# Patient Record
Sex: Male | Born: 1955
Health system: Southern US, Community
[De-identification: ages and names within clinical notes are randomized; demographics above are authoritative.]

## PROBLEM LIST (undated history)

## (undated) DIAGNOSIS — K219 Gastro-esophageal reflux disease without esophagitis: Secondary | ICD-10-CM

## (undated) DIAGNOSIS — R7309 Other abnormal glucose: Secondary | ICD-10-CM

## (undated) DIAGNOSIS — K635 Polyp of colon: Secondary | ICD-10-CM

## (undated) DIAGNOSIS — M75100 Unspecified rotator cuff tear or rupture of unspecified shoulder, not specified as traumatic: Secondary | ICD-10-CM

## (undated) DIAGNOSIS — E785 Hyperlipidemia, unspecified: Secondary | ICD-10-CM

## (undated) DIAGNOSIS — Z8249 Family history of ischemic heart disease and other diseases of the circulatory system: Secondary | ICD-10-CM

## (undated) HISTORY — DX: Unspecified rotator cuff tear or rupture of unspecified shoulder, not specified as traumatic: M75.100

## (undated) HISTORY — DX: Other abnormal glucose: R73.09

## (undated) HISTORY — PX: COLONOSCOPY: SHX174

## (undated) HISTORY — DX: Hyperlipidemia, unspecified: E78.5

## (undated) HISTORY — PX: OTHER SURGICAL HISTORY: SHX169

## (undated) HISTORY — DX: Gastro-esophageal reflux disease without esophagitis: K21.9

## (undated) HISTORY — DX: Polyp of colon: K63.5

## (undated) HISTORY — DX: Family history of ischemic heart disease and other diseases of the circulatory system: Z82.49

---

## 1991-02-12 HISTORY — PX: CORNEAL TRANSPLANT: SHX108

## 2000-01-02 ENCOUNTER — Encounter: Payer: Self-pay | Admitting: Neurosurgery

## 2000-01-02 ENCOUNTER — Ambulatory Visit (HOSPITAL_COMMUNITY): Admission: RE | Admit: 2000-01-02 | Discharge: 2000-01-02 | Payer: Self-pay | Admitting: Neurosurgery

## 2000-01-16 ENCOUNTER — Encounter: Payer: Self-pay | Admitting: Neurosurgery

## 2000-01-16 ENCOUNTER — Ambulatory Visit (HOSPITAL_COMMUNITY): Admission: RE | Admit: 2000-01-16 | Discharge: 2000-01-16 | Payer: Self-pay | Admitting: Neurosurgery

## 2000-01-30 ENCOUNTER — Encounter: Payer: Self-pay | Admitting: Neurosurgery

## 2000-01-30 ENCOUNTER — Ambulatory Visit (HOSPITAL_COMMUNITY): Admission: RE | Admit: 2000-01-30 | Discharge: 2000-01-30 | Payer: Self-pay | Admitting: Neurosurgery

## 2000-03-12 ENCOUNTER — Ambulatory Visit (HOSPITAL_COMMUNITY): Admission: RE | Admit: 2000-03-12 | Discharge: 2000-03-12 | Payer: Self-pay | Admitting: Neurosurgery

## 2000-03-12 ENCOUNTER — Encounter: Payer: Self-pay | Admitting: Neurosurgery

## 2000-04-18 ENCOUNTER — Encounter: Payer: Self-pay | Admitting: Neurosurgery

## 2000-04-22 ENCOUNTER — Inpatient Hospital Stay (HOSPITAL_COMMUNITY): Admission: RE | Admit: 2000-04-22 | Discharge: 2000-04-24 | Payer: Self-pay | Admitting: Neurosurgery

## 2000-04-22 ENCOUNTER — Encounter: Payer: Self-pay | Admitting: Neurosurgery

## 2002-10-22 ENCOUNTER — Encounter: Payer: Self-pay | Admitting: Cardiology

## 2002-10-22 ENCOUNTER — Ambulatory Visit (HOSPITAL_COMMUNITY): Admission: RE | Admit: 2002-10-22 | Discharge: 2002-10-22 | Payer: Self-pay | Admitting: Cardiology

## 2003-02-12 HISTORY — PX: OTHER SURGICAL HISTORY: SHX169

## 2003-04-27 ENCOUNTER — Encounter: Admission: RE | Admit: 2003-04-27 | Discharge: 2003-04-27 | Payer: Self-pay | Admitting: Family Medicine

## 2003-07-23 ENCOUNTER — Encounter: Admission: RE | Admit: 2003-07-23 | Discharge: 2003-07-23 | Payer: Self-pay | Admitting: Family Medicine

## 2004-01-02 ENCOUNTER — Encounter: Admission: RE | Admit: 2004-01-02 | Discharge: 2004-01-02 | Payer: Self-pay | Admitting: Cardiology

## 2004-02-12 HISTORY — PX: BACK SURGERY: SHX140

## 2004-02-17 ENCOUNTER — Encounter: Admission: RE | Admit: 2004-02-17 | Discharge: 2004-02-17 | Payer: Self-pay | Admitting: Family Medicine

## 2004-04-20 ENCOUNTER — Encounter: Admission: RE | Admit: 2004-04-20 | Discharge: 2004-04-20 | Payer: Self-pay | Admitting: Cardiology

## 2004-05-01 ENCOUNTER — Inpatient Hospital Stay (HOSPITAL_BASED_OUTPATIENT_CLINIC_OR_DEPARTMENT_OTHER): Admission: RE | Admit: 2004-05-01 | Discharge: 2004-05-01 | Payer: Self-pay | Admitting: Cardiology

## 2004-11-06 ENCOUNTER — Encounter: Admission: RE | Admit: 2004-11-06 | Discharge: 2004-11-06 | Payer: Self-pay | Admitting: Cardiology

## 2005-02-11 HISTORY — PX: OTHER SURGICAL HISTORY: SHX169

## 2005-08-17 ENCOUNTER — Encounter: Admission: RE | Admit: 2005-08-17 | Discharge: 2005-08-17 | Payer: Self-pay | Admitting: Family Medicine

## 2005-09-26 ENCOUNTER — Ambulatory Visit: Payer: Self-pay | Admitting: Internal Medicine

## 2005-09-27 ENCOUNTER — Ambulatory Visit: Payer: Self-pay | Admitting: Internal Medicine

## 2005-10-01 ENCOUNTER — Encounter: Admission: RE | Admit: 2005-10-01 | Discharge: 2005-10-01 | Payer: Self-pay | Admitting: Internal Medicine

## 2005-11-13 ENCOUNTER — Ambulatory Visit: Payer: Self-pay | Admitting: Internal Medicine

## 2005-11-19 ENCOUNTER — Ambulatory Visit: Payer: Self-pay | Admitting: Internal Medicine

## 2005-12-26 ENCOUNTER — Ambulatory Visit: Payer: Self-pay | Admitting: Internal Medicine

## 2006-01-01 ENCOUNTER — Ambulatory Visit: Payer: Self-pay | Admitting: Internal Medicine

## 2006-01-01 LAB — CONVERTED CEMR LAB
ALT: 101 units/L — ABNORMAL HIGH (ref 0–40)
AST: 47 units/L — ABNORMAL HIGH (ref 0–37)
Chol/HDL Ratio, serum: 3.3
Cholesterol: 175 mg/dL (ref 0–200)
HDL: 52.5 mg/dL (ref >39.0)
LDL Cholesterol: 99 mg/dL (ref 0–99)
Triglyceride fasting, serum: 117 mg/dL (ref 0–149)
VLDL: 23 mg/dL (ref 0–40)

## 2006-01-08 ENCOUNTER — Ambulatory Visit: Payer: Self-pay | Admitting: Internal Medicine

## 2006-03-21 ENCOUNTER — Ambulatory Visit: Payer: Self-pay | Admitting: Internal Medicine

## 2006-03-30 DIAGNOSIS — J309 Allergic rhinitis, unspecified: Secondary | ICD-10-CM | POA: Insufficient documentation

## 2006-05-28 ENCOUNTER — Ambulatory Visit: Payer: Self-pay | Admitting: Internal Medicine

## 2006-05-28 LAB — CONVERTED CEMR LAB: Uric Acid, Serum: 6 mg/dL (ref 2.4–7.0)

## 2006-06-04 ENCOUNTER — Ambulatory Visit: Payer: Self-pay | Admitting: Internal Medicine

## 2006-06-04 LAB — CONVERTED CEMR LAB
ALT: 34 units/L (ref 0–40)
AST: 24 units/L (ref 0–37)
Cholesterol: 211 mg/dL (ref 0–200)
Direct LDL: 129.8 mg/dL
HDL: 54.2 mg/dL (ref >39.0)
Total CHOL/HDL Ratio: 3.9
Triglycerides: 190 mg/dL — ABNORMAL HIGH (ref 0–149)
VLDL: 38 mg/dL (ref 0–40)

## 2006-08-25 ENCOUNTER — Ambulatory Visit: Payer: Self-pay | Admitting: Internal Medicine

## 2007-01-21 ENCOUNTER — Ambulatory Visit: Payer: Self-pay | Admitting: Internal Medicine

## 2007-01-21 LAB — CONVERTED CEMR LAB: Rapid Strep: NEGATIVE

## 2007-01-26 ENCOUNTER — Encounter (INDEPENDENT_AMBULATORY_CARE_PROVIDER_SITE_OTHER): Payer: Self-pay | Admitting: *Deleted

## 2007-01-26 LAB — CONVERTED CEMR LAB
Cholesterol: 309 mg/dL (ref 0–200)
Direct LDL: 217.1 mg/dL
HDL: 57.1 mg/dL (ref >39.0)
Total CHOL/HDL Ratio: 5.4
Triglycerides: 202 mg/dL (ref 0–149)
VLDL: 40 mg/dL (ref 0–40)

## 2007-03-17 ENCOUNTER — Telehealth (INDEPENDENT_AMBULATORY_CARE_PROVIDER_SITE_OTHER): Payer: Self-pay | Admitting: *Deleted

## 2007-04-28 ENCOUNTER — Ambulatory Visit: Payer: Self-pay | Admitting: Internal Medicine

## 2007-05-06 LAB — CONVERTED CEMR LAB
ALT: 50 units/L (ref 0–53)
AST: 33 units/L (ref 0–37)
Albumin: 4.1 g/dL (ref 3.5–5.2)
Alkaline Phosphatase: 62 units/L (ref 39–117)
Bilirubin, Direct: 0.1 mg/dL (ref 0.0–0.3)
Cholesterol: 213 mg/dL (ref 0–200)
Direct LDL: 126.9 mg/dL
HDL: 57.6 mg/dL (ref >39.0)
Total Bilirubin: 0.9 mg/dL (ref 0.3–1.2)
Total CHOL/HDL Ratio: 3.7
Total Protein: 7.2 g/dL (ref 6.0–8.3)
Triglycerides: 272 mg/dL (ref 0–149)
VLDL: 54 mg/dL — ABNORMAL HIGH (ref 0–40)

## 2007-05-07 ENCOUNTER — Encounter (INDEPENDENT_AMBULATORY_CARE_PROVIDER_SITE_OTHER): Payer: Self-pay | Admitting: *Deleted

## 2007-05-25 ENCOUNTER — Ambulatory Visit: Payer: Self-pay | Admitting: Internal Medicine

## 2007-05-25 DIAGNOSIS — M5412 Radiculopathy, cervical region: Secondary | ICD-10-CM | POA: Insufficient documentation

## 2007-05-25 DIAGNOSIS — E785 Hyperlipidemia, unspecified: Secondary | ICD-10-CM | POA: Insufficient documentation

## 2007-06-01 ENCOUNTER — Encounter (INDEPENDENT_AMBULATORY_CARE_PROVIDER_SITE_OTHER): Payer: Self-pay | Admitting: *Deleted

## 2007-07-21 ENCOUNTER — Ambulatory Visit: Payer: Self-pay | Admitting: Internal Medicine

## 2007-11-16 ENCOUNTER — Telehealth (INDEPENDENT_AMBULATORY_CARE_PROVIDER_SITE_OTHER): Payer: Self-pay | Admitting: *Deleted

## 2007-11-24 ENCOUNTER — Encounter: Payer: Self-pay | Admitting: Internal Medicine

## 2007-12-01 ENCOUNTER — Encounter: Admission: RE | Admit: 2007-12-01 | Discharge: 2008-02-29 | Payer: Self-pay | Admitting: Sports Medicine

## 2007-12-03 ENCOUNTER — Encounter: Payer: Self-pay | Admitting: Internal Medicine

## 2007-12-14 ENCOUNTER — Telehealth (INDEPENDENT_AMBULATORY_CARE_PROVIDER_SITE_OTHER): Payer: Self-pay | Admitting: *Deleted

## 2007-12-14 ENCOUNTER — Ambulatory Visit: Payer: Self-pay | Admitting: Internal Medicine

## 2007-12-14 LAB — CONVERTED CEMR LAB
Cholesterol: 340 mg/dL (ref 0–200)
Direct LDL: 227.3 mg/dL
HDL: 46.7 mg/dL (ref >39.0)
Hgb A1c MFr Bld: 6 % (ref 4.6–6.0)
Total CHOL/HDL Ratio: 7.3
Triglycerides: 308 mg/dL (ref 0–149)
VLDL: 62 mg/dL — ABNORMAL HIGH (ref 0–40)

## 2007-12-25 ENCOUNTER — Ambulatory Visit: Payer: Self-pay | Admitting: Internal Medicine

## 2007-12-25 LAB — CONVERTED CEMR LAB
Cholesterol, target level: 200 mg/dL
HDL goal, serum: 40 mg/dL
LDL Goal: 130 mg/dL

## 2008-01-01 ENCOUNTER — Encounter (INDEPENDENT_AMBULATORY_CARE_PROVIDER_SITE_OTHER): Payer: Self-pay | Admitting: *Deleted

## 2008-01-14 ENCOUNTER — Encounter: Payer: Self-pay | Admitting: Internal Medicine

## 2008-02-01 ENCOUNTER — Encounter: Payer: Self-pay | Admitting: Internal Medicine

## 2008-07-07 ENCOUNTER — Ambulatory Visit: Payer: Self-pay | Admitting: Internal Medicine

## 2008-07-07 DIAGNOSIS — M2550 Pain in unspecified joint: Secondary | ICD-10-CM | POA: Insufficient documentation

## 2008-07-07 DIAGNOSIS — IMO0001 Reserved for inherently not codable concepts without codable children: Secondary | ICD-10-CM | POA: Insufficient documentation

## 2008-07-08 ENCOUNTER — Encounter (INDEPENDENT_AMBULATORY_CARE_PROVIDER_SITE_OTHER): Payer: Self-pay | Admitting: *Deleted

## 2008-07-08 ENCOUNTER — Telehealth (INDEPENDENT_AMBULATORY_CARE_PROVIDER_SITE_OTHER): Payer: Self-pay | Admitting: *Deleted

## 2008-07-08 LAB — CONVERTED CEMR LAB
ALT: 102 units/L — ABNORMAL HIGH (ref 0–53)
AST: 57 units/L — ABNORMAL HIGH (ref 0–37)
Albumin: 4.3 g/dL (ref 3.5–5.2)
Alkaline Phosphatase: 71 units/L (ref 39–117)
Bilirubin, Direct: 0 mg/dL (ref 0.0–0.3)
Calcium: 9.3 mg/dL (ref 8.4–10.5)
Cholesterol: 248 mg/dL — ABNORMAL HIGH (ref 0–200)
Direct LDL: 172.6 mg/dL
HDL: 67.9 mg/dL (ref >39.00)
Magnesium: 2.3 mg/dL (ref 1.5–2.5)
Potassium: 4.4 meq/L (ref 3.5–5.1)
Rhuematoid fact SerPl-aCnc: 20.2 intl units/mL — ABNORMAL HIGH (ref 0.0–20.0)
Sed Rate: 10 mm/hr (ref 0–22)
Total Bilirubin: 0.9 mg/dL (ref 0.3–1.2)
Total CHOL/HDL Ratio: 4
Total CK: 101 units/L (ref 7–232)
Total Protein: 7.6 g/dL (ref 6.0–8.3)
Triglycerides: 133 mg/dL (ref 0.0–149.0)
Uric Acid, Serum: 7.2 mg/dL (ref 4.0–7.8)
VLDL: 26.6 mg/dL (ref 0.0–40.0)
Vit D, 25-Hydroxy: 18 ng/mL — ABNORMAL LOW (ref 30–89)

## 2008-07-13 ENCOUNTER — Ambulatory Visit: Payer: Self-pay | Admitting: Internal Medicine

## 2008-07-13 DIAGNOSIS — E559 Vitamin D deficiency, unspecified: Secondary | ICD-10-CM | POA: Insufficient documentation

## 2008-07-13 DIAGNOSIS — R5383 Other fatigue: Secondary | ICD-10-CM | POA: Insufficient documentation

## 2008-07-13 DIAGNOSIS — Z8619 Personal history of other infectious and parasitic diseases: Secondary | ICD-10-CM | POA: Insufficient documentation

## 2008-07-13 DIAGNOSIS — R7401 Elevation of levels of liver transaminase levels: Secondary | ICD-10-CM | POA: Insufficient documentation

## 2008-07-13 DIAGNOSIS — R740 Nonspecific elevation of levels of transaminase and lactic acid dehydrogenase [LDH]: Secondary | ICD-10-CM

## 2008-07-13 LAB — CONVERTED CEMR LAB
HCV Ab: NEGATIVE
Hep A IgM: NEGATIVE
Hep B C IgM: NEGATIVE
Hepatitis B Surface Ag: NEGATIVE

## 2008-07-15 ENCOUNTER — Telehealth (INDEPENDENT_AMBULATORY_CARE_PROVIDER_SITE_OTHER): Payer: Self-pay | Admitting: *Deleted

## 2008-07-15 LAB — CONVERTED CEMR LAB
ALT: 55 units/L — ABNORMAL HIGH (ref 0–53)
AST: 25 units/L (ref 0–37)
Albumin: 4.3 g/dL (ref 3.5–5.2)
Alkaline Phosphatase: 65 units/L (ref 39–117)
Basophils Absolute: 0 10*3/uL (ref 0.0–0.1)
Basophils Relative: 0.5 % (ref 0.0–3.0)
Bilirubin, Direct: 0.1 mg/dL (ref 0.0–0.3)
Eosinophils Absolute: 0.1 10*3/uL (ref 0.0–0.7)
Eosinophils Relative: 1.8 % (ref 0.0–5.0)
Free T4: 0.8 ng/dL (ref 0.6–1.6)
HCT: 41.5 % (ref 39.0–52.0)
Hemoglobin: 13.9 g/dL (ref 13.0–17.0)
Lymphocytes Relative: 26.3 % (ref 12.0–46.0)
Lymphs Abs: 1.1 10*3/uL (ref 0.7–4.0)
MCHC: 33.6 g/dL (ref 30.0–36.0)
MCV: 84.3 fL (ref 78.0–100.0)
Monocytes Absolute: 0.4 10*3/uL (ref 0.1–1.0)
Monocytes Relative: 8.2 % (ref 3.0–12.0)
Neutro Abs: 2.7 10*3/uL (ref 1.4–7.7)
Neutrophils Relative %: 63.2 % (ref 43.0–77.0)
Platelets: 180 10*3/uL (ref 150.0–400.0)
RBC: 4.92 M/uL (ref 4.22–5.81)
RDW: 12.3 % (ref 11.5–14.6)
T3, Free: 2.6 pg/mL (ref 2.3–4.2)
TSH: 1.49 microintl units/mL (ref 0.35–5.50)
Total Bilirubin: 0.7 mg/dL (ref 0.3–1.2)
Total Protein: 7.6 g/dL (ref 6.0–8.3)
WBC: 4.3 10*3/uL — ABNORMAL LOW (ref 4.5–10.5)

## 2008-07-18 ENCOUNTER — Ambulatory Visit: Payer: Self-pay | Admitting: Internal Medicine

## 2008-07-18 LAB — CONVERTED CEMR LAB
ALT: 51 units/L (ref 0–53)
AST: 32 units/L (ref 0–37)

## 2008-07-19 ENCOUNTER — Encounter (INDEPENDENT_AMBULATORY_CARE_PROVIDER_SITE_OTHER): Payer: Self-pay | Admitting: *Deleted

## 2008-09-07 ENCOUNTER — Ambulatory Visit: Payer: Self-pay | Admitting: Internal Medicine

## 2008-09-11 LAB — CONVERTED CEMR LAB
Basophils Absolute: 0 10*3/uL (ref 0.0–0.1)
Basophils Relative: 0.3 % (ref 0.0–3.0)
Eosinophils Absolute: 0.1 10*3/uL (ref 0.0–0.7)
Eosinophils Relative: 3.4 % (ref 0.0–5.0)
HCT: 40.7 % (ref 39.0–52.0)
Hemoglobin: 14 g/dL (ref 13.0–17.0)
Lymphocytes Relative: 23.4 % (ref 12.0–46.0)
Lymphs Abs: 1 10*3/uL (ref 0.7–4.0)
MCHC: 34.4 g/dL (ref 30.0–36.0)
MCV: 83.1 fL (ref 78.0–100.0)
Monocytes Absolute: 0.4 10*3/uL (ref 0.1–1.0)
Monocytes Relative: 8.4 % (ref 3.0–12.0)
Neutro Abs: 2.9 10*3/uL (ref 1.4–7.7)
Neutrophils Relative %: 64.5 % (ref 43.0–77.0)
Platelets: 161 10*3/uL (ref 150.0–400.0)
RBC: 4.89 M/uL (ref 4.22–5.81)
RDW: 12.5 % (ref 11.5–14.6)
Rhuematoid fact SerPl-aCnc: <20 intl units/mL (ref 0.0–20.0)
Sed Rate: 11 mm/hr (ref 0–22)
Total CK: 130 units/L (ref 7–232)
Uric Acid, Serum: 6.4 mg/dL (ref 4.0–7.8)
Vit D, 25-Hydroxy: 38 ng/mL (ref 30–89)
WBC: 4.4 10*3/uL — ABNORMAL LOW (ref 4.5–10.5)

## 2008-09-13 ENCOUNTER — Encounter (INDEPENDENT_AMBULATORY_CARE_PROVIDER_SITE_OTHER): Payer: Self-pay | Admitting: *Deleted

## 2008-09-15 ENCOUNTER — Telehealth (INDEPENDENT_AMBULATORY_CARE_PROVIDER_SITE_OTHER): Payer: Self-pay | Admitting: *Deleted

## 2008-11-08 ENCOUNTER — Telehealth: Payer: Self-pay | Admitting: Internal Medicine

## 2008-11-11 ENCOUNTER — Ambulatory Visit: Payer: Self-pay | Admitting: Internal Medicine

## 2008-11-11 LAB — CONVERTED CEMR LAB: Vit D, 25-Hydroxy: 35 ng/mL (ref 30–89)

## 2008-11-14 ENCOUNTER — Encounter (INDEPENDENT_AMBULATORY_CARE_PROVIDER_SITE_OTHER): Payer: Self-pay | Admitting: *Deleted

## 2008-11-24 ENCOUNTER — Ambulatory Visit: Payer: Self-pay | Admitting: Gastroenterology

## 2008-11-25 ENCOUNTER — Ambulatory Visit: Payer: Self-pay | Admitting: Internal Medicine

## 2008-11-28 ENCOUNTER — Encounter: Payer: Self-pay | Admitting: Family Medicine

## 2008-12-01 ENCOUNTER — Telehealth (INDEPENDENT_AMBULATORY_CARE_PROVIDER_SITE_OTHER): Payer: Self-pay | Admitting: *Deleted

## 2008-12-07 ENCOUNTER — Ambulatory Visit: Payer: Self-pay | Admitting: Internal Medicine

## 2008-12-08 LAB — CONVERTED CEMR LAB
Hgb A1c MFr Bld: 5.9 % (ref 4.6–6.5)
Sed Rate: 11 mm/hr (ref 0–22)
Total CK: 121 units/L (ref 7–232)

## 2008-12-09 ENCOUNTER — Encounter (INDEPENDENT_AMBULATORY_CARE_PROVIDER_SITE_OTHER): Payer: Self-pay | Admitting: *Deleted

## 2008-12-09 LAB — CONVERTED CEMR LAB: Vit D, 25-Hydroxy: 36 ng/mL (ref 30–89)

## 2009-02-01 ENCOUNTER — Encounter: Payer: Self-pay | Admitting: Internal Medicine

## 2009-02-11 HISTORY — PX: UPPER GASTROINTESTINAL ENDOSCOPY: SHX188

## 2009-02-16 ENCOUNTER — Ambulatory Visit: Payer: Self-pay | Admitting: Internal Medicine

## 2009-02-20 LAB — CONVERTED CEMR LAB
Sed Rate: 9 mm/hr (ref 0–22)
Total CK: 125 units/L (ref 7–232)
Vit D, 25-Hydroxy: 34 ng/mL (ref 30–89)

## 2009-02-22 ENCOUNTER — Encounter (INDEPENDENT_AMBULATORY_CARE_PROVIDER_SITE_OTHER): Payer: Self-pay | Admitting: *Deleted

## 2009-02-24 ENCOUNTER — Telehealth (INDEPENDENT_AMBULATORY_CARE_PROVIDER_SITE_OTHER): Payer: Self-pay | Admitting: *Deleted

## 2009-03-02 ENCOUNTER — Encounter (INDEPENDENT_AMBULATORY_CARE_PROVIDER_SITE_OTHER): Payer: Self-pay | Admitting: *Deleted

## 2009-03-06 ENCOUNTER — Ambulatory Visit: Payer: Self-pay | Admitting: Gastroenterology

## 2009-03-15 ENCOUNTER — Ambulatory Visit: Payer: Self-pay | Admitting: Gastroenterology

## 2009-03-15 LAB — HM COLONOSCOPY

## 2009-03-17 ENCOUNTER — Encounter: Payer: Self-pay | Admitting: Gastroenterology

## 2009-04-25 ENCOUNTER — Encounter: Admission: RE | Admit: 2009-04-25 | Discharge: 2009-04-25 | Payer: Self-pay | Admitting: Cardiology

## 2009-05-01 ENCOUNTER — Ambulatory Visit: Payer: Self-pay | Admitting: Internal Medicine

## 2009-06-06 ENCOUNTER — Ambulatory Visit: Payer: Self-pay | Admitting: Internal Medicine

## 2009-06-09 ENCOUNTER — Encounter: Payer: Self-pay | Admitting: Internal Medicine

## 2009-06-15 ENCOUNTER — Encounter: Admission: RE | Admit: 2009-06-15 | Discharge: 2009-09-13 | Payer: Self-pay | Admitting: Internal Medicine

## 2009-06-19 ENCOUNTER — Telehealth (INDEPENDENT_AMBULATORY_CARE_PROVIDER_SITE_OTHER): Payer: Self-pay | Admitting: *Deleted

## 2009-06-19 ENCOUNTER — Encounter: Payer: Self-pay | Admitting: Internal Medicine

## 2009-07-06 ENCOUNTER — Ambulatory Visit: Payer: Self-pay | Admitting: Internal Medicine

## 2009-07-11 LAB — CONVERTED CEMR LAB
ALT: 47 units/L (ref 0–53)
AST: 28 units/L (ref 0–37)
Albumin: 4.1 g/dL (ref 3.5–5.2)
Alkaline Phosphatase: 60 units/L (ref 39–117)
Bilirubin, Direct: 0.1 mg/dL (ref 0.0–0.3)
Cholesterol: 226 mg/dL — ABNORMAL HIGH (ref 0–200)
Direct LDL: 143.5 mg/dL
HDL: 57 mg/dL (ref >39.00)
Hgb A1c MFr Bld: 6 % (ref 4.6–6.5)
Total Bilirubin: 0.4 mg/dL (ref 0.3–1.2)
Total CHOL/HDL Ratio: 4
Total Protein: 6.9 g/dL (ref 6.0–8.3)
Triglycerides: 245 mg/dL — ABNORMAL HIGH (ref 0.0–149.0)
VLDL: 49 mg/dL — ABNORMAL HIGH (ref 0.0–40.0)

## 2009-07-14 ENCOUNTER — Ambulatory Visit: Payer: Self-pay | Admitting: Internal Medicine

## 2009-07-14 DIAGNOSIS — M25519 Pain in unspecified shoulder: Secondary | ICD-10-CM | POA: Insufficient documentation

## 2009-07-14 DIAGNOSIS — Z860100 Personal history of colon polyps, unspecified: Secondary | ICD-10-CM | POA: Insufficient documentation

## 2009-08-01 ENCOUNTER — Ambulatory Visit: Payer: Self-pay | Admitting: Internal Medicine

## 2009-08-01 DIAGNOSIS — R0989 Other specified symptoms and signs involving the circulatory and respiratory systems: Secondary | ICD-10-CM | POA: Insufficient documentation

## 2009-08-04 ENCOUNTER — Ambulatory Visit: Payer: Self-pay | Admitting: Internal Medicine

## 2009-09-29 ENCOUNTER — Ambulatory Visit: Payer: Self-pay | Admitting: Internal Medicine

## 2009-09-29 ENCOUNTER — Encounter: Payer: Self-pay | Admitting: Gastroenterology

## 2009-09-29 ENCOUNTER — Encounter (INDEPENDENT_AMBULATORY_CARE_PROVIDER_SITE_OTHER): Payer: Self-pay | Admitting: *Deleted

## 2009-09-29 DIAGNOSIS — R1013 Epigastric pain: Secondary | ICD-10-CM

## 2009-09-29 DIAGNOSIS — K3189 Other diseases of stomach and duodenum: Secondary | ICD-10-CM | POA: Insufficient documentation

## 2009-09-29 DIAGNOSIS — J984 Other disorders of lung: Secondary | ICD-10-CM | POA: Insufficient documentation

## 2009-10-18 ENCOUNTER — Encounter (INDEPENDENT_AMBULATORY_CARE_PROVIDER_SITE_OTHER): Payer: Self-pay | Admitting: *Deleted

## 2009-10-18 ENCOUNTER — Ambulatory Visit: Payer: Self-pay | Admitting: Gastroenterology

## 2009-10-27 ENCOUNTER — Ambulatory Visit: Payer: Self-pay | Admitting: Gastroenterology

## 2009-11-01 ENCOUNTER — Encounter: Payer: Self-pay | Admitting: Gastroenterology

## 2009-11-02 ENCOUNTER — Encounter: Payer: Self-pay | Admitting: Internal Medicine

## 2009-11-16 ENCOUNTER — Ambulatory Visit: Payer: Self-pay | Admitting: Internal Medicine

## 2009-11-23 ENCOUNTER — Telehealth: Payer: Self-pay | Admitting: Internal Medicine

## 2010-02-16 ENCOUNTER — Ambulatory Visit
Admission: RE | Admit: 2010-02-16 | Discharge: 2010-02-16 | Payer: Self-pay | Source: Home / Self Care | Attending: Internal Medicine | Admitting: Internal Medicine

## 2010-02-22 ENCOUNTER — Telehealth (INDEPENDENT_AMBULATORY_CARE_PROVIDER_SITE_OTHER): Payer: Self-pay | Admitting: *Deleted

## 2010-02-26 ENCOUNTER — Ambulatory Visit
Admission: RE | Admit: 2010-02-26 | Discharge: 2010-02-26 | Payer: Self-pay | Source: Home / Self Care | Attending: Internal Medicine | Admitting: Internal Medicine

## 2010-02-26 ENCOUNTER — Encounter: Payer: Self-pay | Admitting: Internal Medicine

## 2010-03-04 ENCOUNTER — Encounter: Payer: Self-pay | Admitting: Cardiology

## 2010-03-11 LAB — CONVERTED CEMR LAB
ALT: 34 units/L (ref 0–53)
AST: 23 units/L (ref 0–37)
Albumin: 4.2 g/dL (ref 3.5–5.2)
Alkaline Phosphatase: 50 units/L (ref 39–117)
BUN: 17 mg/dL (ref 6–23)
Basophils Absolute: 0 10*3/uL (ref 0.0–0.1)
Basophils Relative: 0.7 % (ref 0.0–1.0)
Bilirubin, Direct: 0.1 mg/dL (ref 0.0–0.3)
CO2: 28 meq/L (ref 19–32)
Calcium: 9.3 mg/dL (ref 8.4–10.5)
Chloride: 106 meq/L (ref 96–112)
Cholesterol, target level: 200 mg/dL
Creatinine, Ser: 1.1 mg/dL (ref 0.4–1.5)
Eosinophils Absolute: 0.1 10*3/uL (ref 0.0–0.7)
Eosinophils Relative: 2.8 % (ref 0.0–5.0)
GFR calc Af Amer: 91 mL/min
GFR calc non Af Amer: 75 mL/min
Glucose, Bld: 110 mg/dL — ABNORMAL HIGH (ref 70–99)
HCT: 42.8 % (ref 39.0–52.0)
HDL goal, serum: 40 mg/dL
Hemoglobin: 14.1 g/dL (ref 13.0–17.0)
Hgb A1c MFr Bld: 6.1 % — ABNORMAL HIGH (ref 4.6–6.0)
LDL Goal: 160 mg/dL
Lymphocytes Relative: 34 % (ref 12.0–46.0)
MCHC: 32.9 g/dL (ref 30.0–36.0)
MCV: 83.2 fL (ref 78.0–100.0)
Monocytes Absolute: 0.4 10*3/uL (ref 0.1–1.0)
Monocytes Relative: 9.1 % (ref 3.0–12.0)
Neutro Abs: 2.1 10*3/uL (ref 1.4–7.7)
Neutrophils Relative %: 53.4 % (ref 43.0–77.0)
PSA: 0.63 ng/mL (ref 0.10–4.00)
Platelets: 182 10*3/uL (ref 150–400)
Potassium: 4.3 meq/L (ref 3.5–5.1)
RBC: 5.15 M/uL (ref 4.22–5.81)
RDW: 12.8 % (ref 11.5–14.6)
Sodium: 141 meq/L (ref 135–145)
TSH: 1.44 microintl units/mL (ref 0.35–5.50)
Total Bilirubin: 0.8 mg/dL (ref 0.3–1.2)
Total Protein: 7.4 g/dL (ref 6.0–8.3)
WBC: 3.9 10*3/uL — ABNORMAL LOW (ref 4.5–10.5)

## 2010-03-15 NOTE — Miscellaneous (Signed)
Summary: [facility]  Renewal Summary/[facility]   Imported By: Lanelle Bal 01/18/2008 08:47:25  _____________________________________________________________________  External Attachment:    Type:   Image     Comment:   External Document

## 2010-03-15 NOTE — Assessment & Plan Note (Signed)
Summary: RO4--TO DISCUSS THE LAST LABS DONE//PH   Vital Signs:  Patient Profile:   55 Years Old Male Weight:      140 pounds Pulse rate:   72 / minute Resp:     15 per minute BP sitting:   120 / 78  (left arm) Cuff size:   large  Vitals Entered By: Shonna Chock (December 25, 2007 3:21 PM)                 Chief Complaint:  1.) FOLLOW-UP ON LABS  2.) ONGOING NECK PAIN.  History of Present Illness: He D/Ced Vytorin 3 mos ago due to back & neck  pain.  Crestor prevented sleep. Serial lipids,A1c & NMR Lipoprofile reviewed & risk reviewed ( > 25% with LDL 227). No diet ; minimal CVE.The 11/24/07  MRIs ordered by Dr Farris Has   reviewed : Supraspinatus tear, C 5, 6,7  possible /probable encroachment  by stenosis/ spurs. No definite abnormal  cord signal.PT ordered by Dr Farris Has.  Lipid Management History:      Positive NCEP/ATP III risk factors include male age 64 years old or older and family history for ischemic heart disease (males less than 8 years old).  Negative NCEP/ATP III risk factors include non-diabetic, non-tobacco-user status, non-hypertensive, no ASHD (atherosclerotic heart disease), no prior stroke/TIA, no peripheral vascular disease, and no history of aortic aneurysm.       Current Allergies (reviewed today): ! * PECAN ASA  Past Medical History:    OTHER AND UNSPECIFIED HYPERLIPIDEMIA (ICD-272.4)    HYPERCHOLESTEROLEMIA (ICD-272.0)    ALLERGIC RHINITIS (ICD-477.9)    Supraspinatus tear; Cervical Stenosis      Review of Systems  CV      Denies chest pain or discomfort, leg cramps with exertion, and shortness of breath with exertion.  MS      Complains of joint pain.      Denies joint redness and joint swelling.      [place] mild hand pain  Neuro      Denies numbness, tingling, and weakness.      Pain LUE down to elbow   Physical Exam  General:     in no acute distress; alert,appropriate and cooperative throughout examination Heart:     Normal rate and  regular rhythm. S1 and S2 normal without gallop, murmur, click, rub or other extra sounds. Pulses:     R and L carotid,radial,dorsalis pedis and posterior tibial pulses are full and equal bilaterally Extremities:     No clubbing, cyanosis, edema, or deformity noted  Neurologic:     alert & oriented X3, strength normal in all extremities, and DTRs symmetrical and normal.   Skin:     Intact without suspicious lesions or rashes Psych:     not anxious appearing, not depressed appearing, but subdued.  Noncompliant     Impression & Recommendations:  Problem # 1:  HYPERLIPIDEMIA (ICD-272.4)  The following medications were removed from the medication list:    Vytorin 10-20 Mg Tabs (Ezetimibe-simvastatin) .Marland Kitchen... 1 by mouth once daily  Orders: Misc. Referral (Misc. Ref)   Problem # 2:  CERVICAL RADICULOPATHY (ICD-723.4)  Complete Medication List: 1)  Flonase 50 Mcg/act Susp (Fluticasone propionate) .Marland Kitchen.. 1 spray each nostril once daily 2)  Tramadol Hcl 50 Mg Tabs (Tramadol hcl) .Marland Kitchen.. 1 q 6-8 hrs as needed neck/ arm  pain 3)  Azithromycin 250 Mg Tabs (Azithromycin) .... As per pack  Lipid Assessment/Plan:      Based on NCEP/ATP  III, the patient's risk factor category is "2 or more risk factors and a calculated 10 year CAD risk of < 20%".  The patient's lipid goals have been set as follows: Total cholesterol goal is 200; LDL cholesterol goal is 130; HDL cholesterol goal is 40; Triglyceride goal is 150.  His LDL cholesterol goal has not been met.  Secondary causes for hyperlipidemia have been ruled out.  He has been counseled on adjunctive measures for lowering his cholesterol and has been provided with dietary instructions.     Patient Instructions: 1)  Keep Lipid Clinic appt   ]

## 2010-03-15 NOTE — Procedures (Signed)
Summary: Colonoscopy  Patient: [name] Note: All result statuses are Final unless otherwise noted.  Tests: (1) Colonoscopy (COL)   COL Colonoscopy           DONE     [facility]     [address]     [place], [place]  [address]           COLONOSCOPY PROCEDURE REPORT           PATIENT:  Kevin Farley, Kevin Farley  MR#:  [id number]     BIRTHDATE:  1955-03-30, 53 yrs. old  GENDER:  male           ENDOSCOPIST:  Rachael Fee, MD     Referred by:  Marga Melnick, M.D.           PROCEDURE DATE:  03/15/2009     PROCEDURE:  Colonoscopy with snare polypectomy     ASA CLASS:  Class II     INDICATIONS:  Routine Risk Screening           MEDICATIONS:   Fentanyl 50 mcg IV, Versed 6 mg IV           DESCRIPTION OF PROCEDURE:   After the risks benefits and     alternatives of the procedure were thoroughly explained, informed     consent was obtained.  Digital rectal exam was performed and     revealed no rectal masses.   The LB CF-[id number] endoscope     was introduced through the anus and advanced to the cecum, which     was identified by both the appendix and ileocecal valve, without     limitations.  The quality of the prep was excellent, using     MoviPrep.  The instrument was then slowly withdrawn as the colon     was fully examined.     <<PROCEDUREIMAGES>>           FINDINGS:  A total of six small sessile polyps were found, all     were removed with cold snare and sent to pathology (jar 1). These     ranged in size from 2mm to 7mm, were located in descending colon,     transverse, sigmoid and rectum (see image3 and image4).  Internal     hemorrhoids were found. These were medium sized (see image5).     This was otherwise a normal examination of the colon (see image2     and image1).   Retroflexed views in the rectum revealed no     abnormalities.    The scope was then withdrawn from the patient     and the procedure completed.           COMPLICATIONS:  None        ENDOSCOPIC IMPRESSION:     1) Six subcentimeter polyps, all removed and sent to pathology     2) Internal hemorrhoids     3) Otherwise normal examination           RECOMMENDATIONS:     1) If the polyp(s) removed today are proven to be adenomatous     (pre-cancerous) polyps, you will need a colonoscopy in 3 years.     Otherwise you should continue to follow colorectal cancer     screening guidelines for "routine risk" patients with a     colonoscopy in 10 years.     2) You will receive a letter within 1-2 weeks with the results  of your biopsy as well as final recommendations. Please call my     office if you have not received a letter after 3 weeks.           REPEAT EXAM:  await pathology           ______________________________     Rachael Fee, MD           n.     eSIGNED:   Rachael Fee at 03/15/2009 11:06 AM           Monica Becton, [id number]  Note: An exclamation mark (!) indicates a result that was not dispersed into the flowsheet. Document Creation Date: 03/15/2009 11:06 AM _______________________________________________________________________  (1) Order result status: Final Collection or observation date-time: 03/15/2009 11:00 Requested date-time:  Receipt date-time:  Reported date-time:  Referring Physician:   Ordering Physician: Rob Bunting [phone]) Specimen Source:  Source: Launa Grill Order Number: [id number] Lab site:   Appended Document: Colonoscopy recall     Procedures Next Due Date:    Colonoscopy: 03/2012

## 2010-03-15 NOTE — Progress Notes (Signed)
Summary: addl lab for cpx  Phone Note Refill Request Call back at [phone] Message from:  Pharmacy on Jun 19, 2009 8:51 AM  Refills Requested: Medication #1:  LORAZEPAM 0.5 MG TABS 1 at bedtime as needed   Dosage confirmed as above?Dosage Confirmed   Supply Requested: 1 month   [facility]  Next Appointment Scheduled: none Initial call taken by: Harold Barban,  Jun 19, 2009 8:51 AM  Follow-up for Phone Call        Please schedule patient for a CPX, patient only seen recently for acute visit's. OK to sign off on once appointment scheuduled Follow-up by: Shonna Chock,  Jun 19, 2009 9:39 AM  Additional Follow-up for Phone Call Additional follow up Details #1::        patient is scheduled for cpx [id number] - lab [phone] -- per note on ov [id number] Please schedule a follow-up appointment in 3-4 weeks. 3)  Hepatic Panel ; CPK; 4)  Lipid Panel 272.4; 5)  HbgA1C . Codes : 272.4, 995.20, 790.29. do you want to add any more to order Additional Follow-up by: Okey Regal [place],  Jun 19, 2009 10:53 AM    Additional Follow-up for Phone Call Additional follow up Details #2::    No labs to add./[facility]  Jun 19, 2009 12:03 PM   Prescriptions: LORAZEPAM 0.5 MG TABS (LORAZEPAM) 1 at bedtime as needed  #30 x 0   Entered by:   Shonna Chock   Authorized by:   Marga Melnick MD   Signed by:   Shonna Chock on 06/19/2009   Method used:   Printed then faxed to ...       [facility] [phone]* (retail)       [address]       [place], [place]  [address]       Ph: [phone]       Fax: [phone]   RxID:   [id number]

## 2010-03-15 NOTE — Miscellaneous (Signed)
Summary: Orders Update   Clinical Lists Changes  Orders: Added new Referral order of Physical Therapy Referral (PT) - Signed 

## 2010-03-15 NOTE — Procedures (Signed)
Summary: Upper Endoscopy  Patient: Kevin Farley Note: All result statuses are Final unless otherwise noted.  Tests: (1) Upper Endoscopy (EGD)   EGD Upper Endoscopy       DONE     [facility]     [address]     [place], [place]  [address]           ENDOSCOPY PROCEDURE REPORT           PATIENT:  [name], [name]  MR#:  [id number]     BIRTHDATE:  December 27, 1955, 53 yrs. old  GENDER:  male     ENDOSCOPIST:  Rachael Fee, MD     Referred by:  Marga Melnick, M.D.     PROCEDURE DATE:  10/27/2009     PROCEDURE:  EGD with biopsy     ASA CLASS:  Class II     INDICATIONS:  GERD, dyspepsia     MEDICATIONS:  Fentanyl 50 mcg IV, Versed 5 mg IV     TOPICAL ANESTHETIC:  none           DESCRIPTION OF PROCEDURE:   After the risks benefits and     alternatives of the procedure were thoroughly explained, informed     consent was obtained.  The LB GIF-H180 [id number] endoscope was     introduced through the mouth and advanced to the second portion of     the duodenum, without limitations.  The instrument was slowly     withdrawn as the mucosa was fully examined.     <<PROCEDUREIMAGES>>     There was mild, non-specific gastritis. Biopsies taken to check     for H. pylori (see image3 and image1).  Otherwise the examination     was normal (see image2, image4, image5, and image6).     Retroflexed views revealed no abnormalities.    The scope was then     withdrawn from the patient and the procedure completed.     COMPLICATIONS:  None           ENDOSCOPIC IMPRESSION:     1) Mild gastritis, biopsied to check for H. pylori     2) Otherwise normal examination           RECOMMENDATIONS:     Continue once daily PPI.     Continue to follow GERD recommendations outlined on handout     given in office.     If biopsies show H. pylori, then will be started on appropriate     antibiotics.           ______________________________     Rachael Fee, MD           n.     eSIGNED:   Rachael Fee  at 10/27/2009 04:04 PM           Monica Becton, [id number]  Note: An exclamation mark (!) indicates a result that was not dispersed into the flowsheet. Document Creation Date: 10/27/2009 4:04 PM _______________________________________________________________________  (1) Order result status: Final Collection or observation date-time: 10/27/2009 15:57 Requested date-time:  Receipt date-time:  Reported date-time:  Referring Physician:   Ordering Physician: Rob Bunting [phone]) Specimen Source:  Source: Launa Grill Order Number: [id number] Lab site:

## 2010-03-15 NOTE — Letter (Signed)
Summary: Results Follow up Letter  [facility]  [address] [place], [place] [address]   Phone: [phone]  Fax: [phone]    01/26/2006 MRN: [id number]  Kevin Farley [address] [place], [place]  [address]  Dear Mr. Gladstone,  The following are the results of your recent test(s):  Test         Result    Pap Smear:        Normal _____  Not Normal _____ Comments: ______________________________________________________ Cholesterol: LDL(Bad cholesterol):         Your goal is less than:         HDL (Good cholesterol):       Your goal is more than: Comments:  ______________________________________________________ Mammogram:        Normal _____  Not Normal _____ Comments:  ___________________________________________________________________ Hemoccult:        Normal _____  Not normal _______ Comments:    _____________________________________________________________________ Other Tests:  Please see attached results and comments   We routinely do not discuss normal results over the telephone.  If you desire a copy of the results, or you have any questions about this information we can discuss them at your next office visit.   Sincerely,

## 2010-03-15 NOTE — Progress Notes (Signed)
Summary: PAPERWORK FROM [facility]   Phone Note Call from Patient Call back at [facility] Phone [phone]   Caller: Patient Summary of Call: PATIENT BROUGHT IN THREE SHEETS FROM [facility] FOR DR HOPPER TO REVIEW  PATIENT KEPT ORIGINALS, WILL MAKE COPY FOR BIN NEAR [facility] DESK, WILL SEND OTHER COPY TO [facility] Initial call taken by: Jerolyn Shin,  December 14, 2007 9:55 AM  Follow-up for Phone Call        COPIES PLACED ON LEDGE FOR DR.HOPPER TO REVIEW, THEN TO BE SCANNED. Follow-up by: Shonna Chock,  December 17, 2007 9:37 AM

## 2010-03-15 NOTE — Assessment & Plan Note (Signed)
Summary: CONGESTION//KN   Vital Signs:  Patient profile:   55 year old male Weight:      143.2 pounds Temp:     98.5 degrees F oral Pulse rate:   76 / minute Resp:     16 per minute BP sitting:   118 / 70  (left arm) Cuff size:   regular  Vitals Entered By: Shonna Chock (August 01, 2009 1:58 PM) CC: Ongoing congestion or allergies Comments REVIEWED MED LIST, PATIENT AGREED DOSE AND INSTRUCTION CORRECT    CC:  Ongoing congestion or allergies.  History of Present Illness: Chest congestion started in 04/2009. Dr Leta Jungling note reviewed; purulent sputum then.Zpack helped but some residual symptoms.No Rx @ present. No PMH of asthma.  Allergies: 1)  ! * Pecan 2)  ! Vytorin 3)  Asa  Review of Systems General:  Denies chills, fever, and sweats. ENT:  Denies nasal congestion and sinus pressure; No facial pain, purulence or frontal headache. Resp:  Denies cough, shortness of breath, sputum productive, and wheezing. Allergy:  Complains of itching eyes and sneezing.  Physical Exam  General:  in no acute distress; alert,appropriate and cooperative throughout examination Mouth:  Oral mucosa and oropharynx without lesions or exudates.  Teeth in good repair. No pharyngeal erythema.   Lungs:  Normal respiratory effort, chest expands symmetrically. Lungs are clear to auscultation, no crackles or wheezes. Cervical Nodes:  No lymphadenopathy noted Axillary Nodes:  No palpable lymphadenopathy   Impression & Recommendations:  Problem # 1:  PULMONARY CONGESTION (ICD-786.9)  Orders: T-2 View CXR (71020TC)  Problem # 2:  ALLERGIC RHINITIS (ICD-477.9)  His updated medication list for this problem includes:    Flonase 50 Mcg/act Susp (Fluticasone propionate) .Marland Kitchen... 2 sprays each nostril once daily prn  Complete Medication List: 1)  Flonase 50 Mcg/act Susp (Fluticasone propionate) .... 2 sprays each nostril once daily prn 2)  Lorazepam 0.5 Mg Tabs (Lorazepam) .Marland Kitchen.. 1 at bedtime as needed 3)   Crestor 10 Mg Tabs (Rosuvastatin calcium) .Marland Kitchen.. 1 by mouth once daily 4)  Singulair 10 Mg Tabs (Montelukast sodium) .Marland Kitchen.. 1 once daily for congestion 5)  Diclofenac Sodium Cr 100 Mg Xr24h-tab (Diclofenac sodium)  Patient Instructions: 1)  Singulair once daily until congestion  resolved. Prescriptions: SINGULAIR 10 MG TABS (MONTELUKAST SODIUM) 1 once daily for congestion  #30 x 1   Entered and Authorized by:   Marga Melnick MD   Signed by:   Marga Melnick MD on 08/01/2009   Method used:   Print then Give to Patient   RxID:   [id number]

## 2010-03-15 NOTE — Miscellaneous (Signed)
Summary: Initial Summary/[facility]  Initial Summary/[facility]   Imported By: Lanelle Bal 12/11/2007 11:17:38  _____________________________________________________________________  External Attachment:    Type:   Image     Comment:   External Document

## 2010-03-15 NOTE — Letter (Signed)
Summary: [facility]  [facility]   Imported By: Lanelle Bal 11/17/2009 13:12:21  _____________________________________________________________________  External Attachment:    Type:   Image     Comment:   External Document

## 2010-03-15 NOTE — Assessment & Plan Note (Signed)
Summary: CRAMPING ALL OVER/KDC   Vital Signs:  Patient profile:   55 year old male Weight:      132.8 pounds Temp:     98.1 degrees F oral Pulse rate:   72 / minute Resp:     14 per minute BP sitting:   108 / 70  (left arm) Cuff size:   large  Vitals Entered By: Shonna Chock (Jul 07, 2008 10:35 AM) CC: CRAMPING ALL OVER X 1 MONTH   CC:  CRAMPING ALL OVER X 1 MONTH.  History of Present Illness: Intermittent cramping diffusely & pain mainly in elbows > 1 month; only on Tramadol almost every day. He takes Vytorin "2-3 X/week". Rx: Tramadol helps. No PMH or FH of MS issues . He is not seeing  Dr Juleen China ; "he scared me; medicine was strong". "I feel the Vytorin is helping with strict diet & exercise"  Allergies: 1)  ! * Pecan 2)  Asa  Review of Systems General:  Denies chills, fever, sweats, and weight loss. CV:  Denies chest pain or discomfort, leg cramps with exertion, and shortness of breath with exertion. MS:  Complains of joint pain, low back pain, muscle, and cramps; denies joint redness, joint swelling, and muscle weakness. Derm:  Denies lesion(s) and rash. Neuro:  Complains of numbness; denies brief paralysis, tingling, and weakness; Numbness positionally @ night in hands.  Physical Exam  General:  in no acute distress; alert,appropriate and cooperative throughout examination Heart:  Normal rate and regular rhythm. S1 and S2 normal without gallop, murmur, click, rub  Abdomen:  Bowel sounds positive,abdomen soft and non-tender without masses, organomegaly or hernias noted. Pulses:  R and L carotid,radial,dorsalis pedis and posterior tibial pulses are full and equal bilaterally Extremities:  No clubbing, cyanosis, edema, or deformity noted with normal full range of motion of all joints.  No muscle apin to compression Neurologic:  alert & oriented X3, strength normal in all extremities, and DTRs symmetrical and normal.   Skin:  Intact without suspicious lesions or  rashes Psych:  memory intact for recent and remote, normally interactive, good eye contact, not anxious appearing, and not depressed appearing.     Impression & Recommendations:  Problem # 1:  UNSPECIFIED MYALGIA AND MYOSITIS (ICD-729.1)  His updated medication list for this problem includes:    Tramadol Hcl 50 Mg Tabs (Tramadol hcl) .Marland Kitchen... 1 q 6-8 hrs as needed neck/ arm  pain  Orders: Venipuncture (93810) TLB-Potassium (K+) (84132-K) TLB-CK Total Only(Creatine Kinase/CPK) (82550-CK) TLB-Rheumatoid Factor (RA) (17510-CH) TLB-Sedimentation Rate (ESR) (85652-ESR) TLB-Magnesium (Mg) (83735-MG) TLB-Calcium (82310-CA) T-Vitamin D (25-Hydroxy) (85277-82423)  Problem # 2:  ARTHRALGIA (ICD-719.40)  Orders: Venipuncture (53614) TLB-Uric Acid, Blood (84550-URIC) TLB-Rheumatoid Factor (RA) (43154-MG) TLB-Sedimentation Rate (ESR) (85652-ESR)  Problem # 3:  HYPERLIPIDEMIA (ICD-272.4)  Self adjustment of statin His updated medication list for this problem includes:    Vytorin 10-20 Mg Tabs (Ezetimibe-simvastatin) .Marland Kitchen... 1 by mouth at bedtime  Orders: TLB-Hepatic/Liver Function Pnl (80076-HEPATIC) Venipuncture (86761) TLB-Lipid Panel (80061-LIPID)  Complete Medication List: 1)  Flonase 50 Mcg/act Susp (Fluticasone propionate) .Marland Kitchen.. 1 spray each nostril once daily 2)  Tramadol Hcl 50 Mg Tabs (Tramadol hcl) .Marland Kitchen.. 1 q 6-8 hrs as needed neck/ arm  pain 3)  Vytorin 10-20 Mg Tabs (Ezetimibe-simvastatin) .Marland Kitchen.. 1 by mouth at bedtime 4)  Azelastine Hcl 0.05 % Soln (Azelastine hcl) .Marland Kitchen.. 1 gtt in each eye as needed for allergies  Patient Instructions: 1)  Vitamin D 1000 International Units once daily ; CoEnzyme  Q 10 once daily until muscle pain resolved

## 2010-03-15 NOTE — Progress Notes (Signed)
Summary: hopp-refill  Phone Note Refill Request Message from:  [facility] [address]  Refills Requested: Medication #1:  fluticasone 50 mcg instill 2 sprays in each side daily fax [phone]   Follow-up for Phone Call        dr lowne pls advise in absent of dr hopper  pt requesting med due to allergy that cause his nose to dry out. pt states that he just need something like rhinicort.09-07-08,.last OV, removed from list ............Marland KitchenFelecia Deloach CMA  December 01, 2008 4:09 PM   Additional Follow-up for Phone Call Additional follow up Details #1::        sent to pharmacy Additional Follow-up by: Loreen Freud DO,  December 01, 2008 4:55 PM    Additional Follow-up for Phone Call Additional follow up Details #2::    pt aware rx sent to pharmacy........Marland KitchenFelecia Deloach CMA  December 02, 2008 8:24 AM   New/Updated Medications: FLONASE 50 MCG/ACT SUSP (FLUTICASONE PROPIONATE) 2 sprays each nostril once daily Prescriptions: FLONASE 50 MCG/ACT SUSP (FLUTICASONE PROPIONATE) 2 sprays each nostril once daily  #1 x 2   Entered and Authorized by:   Loreen Freud DO   Signed by:   Loreen Freud DO on 12/01/2008   Method used:   [facility]        [facility] [phone][facility] (retail)       [address]       [place], [place]  [address]       Ph: [phone]       Fax: [phone]   RxID:   [id number]

## 2010-03-15 NOTE — Progress Notes (Signed)
Summary: colonscopy order  Phone Note Call from Patient   Summary of Call: pt would like to be schedule for colonscopy. pt states that he has not had one done and per dr hopper a few years back he needed to have this done........Marland KitchenFelecia Deloach CMA  November 08, 2008 1:45 PM   Follow-up for Phone Call        screening colonoscopy due to age(Code is cancer screening ) Follow-up by: Marga Melnick MD,  November 09, 2008 5:56 AM  Additional Follow-up for Phone Call Additional follow up Details #1::        Appt Scheduled Additional Follow-up by: Magdalen Spatz [facility],  November 11, 2008 11:26 AM  New Problems: SCREENING, COLON CANCER (ICD-V76.51)   New Problems: SCREENING, COLON CANCER (ICD-V76.51)

## 2010-03-15 NOTE — Letter (Signed)
Summary: Results Follow up Letter  [facility] [place]  [address]   [place], [place] [address]   Phone: [phone]  Fax: [phone]    01/26/2007 MRN: [id number]  Kevin Farley [address] [place], [place]  [address]  Dear Mr. [name],  The following are the results of your recent test(s):  Test         Result    Pap Smear:        Normal _____  Not Normal _____ Comments: ______________________________________________________ Cholesterol: LDL(Bad cholesterol):         Your goal is less than:         HDL (Good cholesterol):       Your goal is more than: Comments:  ______________________________________________________ Mammogram:        Normal _____  Not Normal _____ Comments:  ___________________________________________________________________ Hemoccult:        Normal _____  Not normal _______ Comments:    _____________________________________________________________________ Other Tests:  Please see attached results and comments   We routinely do not discuss normal results over the telephone.  If you desire a copy of the results, or you have any questions about this information we can discuss them at your next office visit.   Sincerely,    

## 2010-03-15 NOTE — Miscellaneous (Signed)
Summary: [facility]  Clinical Lists Changes  Observations: Added new observation of ALLERGY REV: Done (03/06/2009 8:21)  Pt has MoviPrep from previously scheduled procedure.  No need for new prep.

## 2010-03-15 NOTE — Letter (Signed)
Summary: Results Follow up Letter  [facility] [place]  [address] [place], [place] [address]   Phone: [phone]  Fax: [phone]    12/09/2008 MRN: [id number]  [facility] [address] [place], [place]  [address]  Dear Mr. Aryav,  The following are the results of your recent test(s):  Test         Result    Pap Smear:        Normal _____  Not Normal _____ Comments: ______________________________________________________ Cholesterol: LDL(Bad cholesterol):         Your goal is less than:         HDL (Good cholesterol):       Your goal is more than: Comments:  ______________________________________________________ Mammogram:        Normal _____  Not Normal _____ Comments:  ___________________________________________________________________ Hemoccult:        Normal _____  Not normal _______ Comments:    _____________________________________________________________________ Other Tests: Please see attached labs done 12/07/2008    We routinely do not discuss normal results over the telephone.  If you desire a copy of the results, or you have any questions about this information we can discuss them at your next office visit.   Sincerely,

## 2010-03-15 NOTE — Assessment & Plan Note (Signed)
Summary: for flu systom//ph   Vital Signs:  Patient profile:   55 year old male Height:      66.5 inches (168.91 cm) Weight:      145.25 pounds (66.02 kg) BMI:     23.18 Temp:     98.3 degrees F (36.83 degrees C) oral Resp:     14 per minute BP sitting:   130 / 88 Cuff size:   regular  Vitals Entered By: Lucious Groves CMA (February 16, 2010 12:05 PM) CC: C/O flu like symptoms./kb, URI symptoms Is Patient Diabetic? No Pain Assessment Patient in pain? no      Comments Patient symptoms are more consistent with possible URI. He notes that he has been having chest pressure, HA, cough, congestion, and green mucous production. He denies fever, SOB, and chest pain.   Primary Care Provider:  Marga Melnick, MD   CC:  C/O flu like symptoms./kb and URI symptoms.  History of Present Illness:      This is a 55 year old man who presents with URI symptoms.  The patient reports nasal congestion, purulent nasal discharge, and productive cough, but denies earache.  The patient denies fever, dyspnea, and wheezing.  The patient also reports headache.  Risk factors for Strep sinusitis include unilateral facial pain and tooth pain.  The patient denies the following risk factors for Strep sinusitis: tender adenopathy.   Rx: Zpack last week  Current Medications (verified): 1)  Multivitamins   Tabs (Multiple Vitamin) .... One Tablet By Mouth Once Daily 2)  Fluticasone Propionate 50 Mcg/act Susp (Fluticasone Propionate) .... Once Daily 3)  Lipitor 80 Mg Tabs (Atorvastatin Calcium) .... 0.5 Tab By Mouth Once Daily  Allergies (verified): 1)  ! * Pecan 2)  ! Vytorin 3)  Asa  Physical Exam  General:  well-nourished,in no acute distress; alert,appropriate and cooperative throughout examination Ears:  External ear exam shows no significant lesions or deformities.  Otoscopic examination reveals clear canals & scarred  tympanic membranes. Hearing is grossly normal bilaterally. Nose:  External nasal  examination shows no deformity or inflammation. Nasal mucosa are  dry without lesions or exudates. Mouth:  Oral mucosa and oropharynx without lesions or exudates.  Teeth in good repair. Lungs:  Normal respiratory effort, chest expands symmetrically. Lungs are clear to auscultation, no crackles or wheezes. Heart:  Normal rate and regular rhythm. S1 and S2 normal without gallop, murmur, click, rub .S4 Cervical Nodes:  Shotty LA , L > R Axillary Nodes:  No palpable lymphadenopathy   Impression & Recommendations:  Problem # 1:  SINUSITIS- ACUTE-NOS (ICD-461.9)  The following medications were removed from the medication list:    Amoxicillin 500 Mg Tabs (Amoxicillin) .Marland Kitchen... 2 by mouth twice a day for one week His updated medication list for this problem includes:    Fluticasone Propionate 50 Mcg/act Susp (Fluticasone propionate) ..... Once daily    Amoxicillin 500 Mg Caps (Amoxicillin) .Marland Kitchen... 1 three times a day  Complete Medication List: 1)  Multivitamins Tabs (Multiple vitamin) .... One tablet by mouth once daily 2)  Fluticasone Propionate 50 Mcg/act Susp (Fluticasone propionate) .... Once daily 3)  Lipitor 80 Mg Tabs (Atorvastatin calcium) .... 0.5 tab by mouth once daily 4)  Amoxicillin 500 Mg Caps (Amoxicillin) .Marland Kitchen.. 1 three times a day  Patient Instructions: 1)  Drink as much NON dairy  fluid as you can tolerate for the next few days. Neti pot once daily - two times a day as needed , followed by  the Flonase. Prescriptions: AMOXICILLIN 500 MG CAPS (AMOXICILLIN) 1 three times a day  #30 x 0   Entered and Authorized by:   Marga Melnick MD   Signed by:   Marga Melnick MD on 02/16/2010   Method used:   Electronically to        [facility] [phone][facility] (retail)       [address]       [place], [place]  [address]       Ph: [phone]       Fax: [phone]   RxID:   [id number]    Orders Added: 1)  Est. Patient Level III [244[address]]

## 2010-03-15 NOTE — Letter (Signed)
Summary: Results Follow up Letter  [facility] [place]  [address] [place], [place] [address]   Phone: [phone]  Fax: [phone]    12/09/2008 MRN: [id number]  [facility] [address] [place], [place]  [address]  Dear Mr. Xayden,  The following are the results of your recent test(s):  Test         Result    Pap Smear:        Normal _____  Not Normal _____ Comments: ______________________________________________________ Cholesterol: LDL(Bad cholesterol):         Your goal is less than:         HDL (Good cholesterol):       Your goal is more than: Comments:  ______________________________________________________ Mammogram:        Normal _____  Not Normal _____ Comments:  ___________________________________________________________________ Hemoccult:        Normal _____  Not normal _______ Comments:    _____________________________________________________________________ Other Tests: Please see attached labs done on 12/07/2008, Vit D level pending.    We routinely do not discuss normal results over the telephone.  If you desire a copy of the results, or you have any questions about this information we can discuss them at your next office visit.   Sincerely,

## 2010-03-15 NOTE — Assessment & Plan Note (Signed)
Summary: congested/cbs   Vital Signs:  Patient Profile:   55 Years Old Male Weight:      136.25 pounds Temp:     96.0 degrees F oral Pulse rate:   72 / minute Pulse rhythm:   regular BP sitting:   130 / 68  (left arm) Cuff size:   large  Pt. in pain?   no  Vitals Entered By: Wendall Stade (August 25, 2006 2:44 PM)                Chief Complaint:  acute sick for three days.  History of Present Illness: sick for three days , pndr, drainage-green, no fever,chills, or sweats, lightheaded, ears feel warm, sneezing,and Also left elbow still numb and hurts. Sudafed X 3days  Current Allergies (reviewed today): ! * PECAN ASA Updated/Current Medications (including changes made in today's visit):  * SUDAFED PRN  AMOXICILLIN 500 MG  CAPS (AMOXICILLIN) 1 three times a day      Review of Systems  Eyes      Denies discharge, eye pain, and red eye.  ENT      See HPI      Complains of sinus pressure.      Denies earache, hoarseness, and nasal congestion.      no facial pain, no anosmia,no obstruction; > vol from head than chest  Resp      Complains of cough and sputum productive.  Neuro      Complains of numbness and tingling.      occa @ L elbow   Physical Exam  General:     Well-developed,well-nourished,in no acute distress; alert,appropriate and cooperative throughout examination Eyes:     pupils equal, pupils round, pupils reactive to light, corneas and lenses clear, no injection, and no iris abnormalities. Full EOM  Ears:     scarred TMs Nose:     boggy Mouth:     mild erythema Lungs:     Normal respiratory effort, chest expands symmetrically. Lungs are clear to auscultation, no crackles or wheezes. Cervical Nodes:     No lymphadenopathy noted Axillary Nodes:     No palpable lymphadenopathy    Impression & Recommendations:  Problem # 1:  SINUSITIS, ACUTE MAXILLARY (ICD-461.0)  His updated medication list for this problem includes:    Amoxicillin  500 Mg Caps (Amoxicillin) .Marland Kitchen... 1 three times a day   Medications Added to Medication List This Visit: 1)  Sudafed Prn  2)  Amoxicillin 500 Mg Caps (Amoxicillin) .Marland Kitchen.. 1 three times a day   Patient Instructions: 1)  For congestion use a Neti pot & plain Mucinex. Avoid Sudafed as it hinders clearance    Prescriptions: AMOXICILLIN 500 MG  CAPS (AMOXICILLIN) 1 three times a day  #30 x 0   Entered and Authorized by:   Marga Melnick MD   Signed by:   Marga Melnick MD on 08/25/2006   Method used:   Print then Give to Patient   RxID:   [id number]

## 2010-03-15 NOTE — Assessment & Plan Note (Signed)
Summary: sore throat/cbs   Vital Signs:  Patient Profile:   55 Years Old Male Weight:      142 pounds Temp:     98.9 degrees F oral BP sitting:   114 / 80  (left arm)  Vitals Entered By: Doristine Devoid (July 21, 2007 4:40 PM)                 Chief Complaint:  sore throat x3 days very hard to swallow and URI symptoms.  History of Present Illness:  URI Symptoms onset 07/19/07; Rx: Chloraseptic & saline gargles      This is a 55 year old man who presents with URI symptoms.  The patient reports sore throat, but denies nasal congestion, clear nasal discharge, purulent nasal discharge, dry cough, productive cough, earache, and sick contacts.  The patient denies fever, stiff neck, dyspnea, wheezing, rash, vomiting, diarrhea, and use of an antipyretic.  The patient denies itchy watery eyes, itchy throat, sneezing, seasonal symptoms, response to antihistamine, headache, muscle aches, and severe fatigue.  The patient denies the following risk factors for Strep sinusitis: double sickening, tooth pain, Strep exposure, and tender adenopathy.                                                                                                                                       Also he D/Ced Vytorin due to joint pain.    Current Allergies: ! * PECAN ASA     Review of Systems  Eyes      Complains of vision loss-1 eye.      Denies discharge, eye pain, and red eye.      Chronic loss OS ;S/P corneal transplant  ENT      Denies nasal congestion and sinus pressure.      No purulence   Physical Exam  General:     Well-developed,well-nourished,in no acute distress; alert,appropriate and cooperative throughout examination Eyes:     No corneal or conjunctival inflammation noted. EOMI. Perrla.  Vision grossly normal in OD. Ears:     External ear exam shows no significant lesions or deformities.  Otoscopic examination reveals clear canals, tympanic membranes are intact bilaterally but scarred   without bulging, retraction, inflammation or discharge. Hearing is grossly normal bilaterally. Nose:     External nasal examination shows no deformity or inflammation. Nasal mucosa are pink and moist without lesions or exudates. Mouth:     Oral mucosa and oropharynx without lesions or exudates.  Teeth in good repair.Mild erythema Lungs:     Normal respiratory effort, chest expands symmetrically. Lungs are clear to auscultation, no crackles or wheezes. Cervical Nodes:     No lymphadenopathy noted Axillary Nodes:     No palpable lymphadenopathy    Impression & Recommendations:  Problem # 1:  PHARYNGITIS-ACUTE (ICD-462)  Orders: Rapid Strep (81191)  His updated medication list for this problem includes:  Azithromycin 250 Mg Tabs (Azithromycin) .Marland Kitchen... As per pack   Problem # 2:  URI (ICD-465.9)  Complete Medication List: 1)  Vytorin 10-20 Mg Tabs (Ezetimibe-simvastatin) .Marland Kitchen.. 1 by mouth once daily 2)  Flonase 50 Mcg/act Susp (Fluticasone propionate) .Marland Kitchen.. 1 spray each nostril once daily 3)  Tramadol Hcl 50 Mg Tabs (Tramadol hcl) .Marland Kitchen.. 1 q 6-8 hrs as needed neck/ arm  pain 4)  Azithromycin 250 Mg Tabs (Azithromycin) .... As per pack   Patient Instructions: 1)  Drink as much fluid as you can tolerate for the next few days. Zinc  lozenges as needed . Review LDL article & NMR Panel    Prescriptions: AZITHROMYCIN 250 MG  TABS (AZITHROMYCIN) as per pack  #1pack x 0   Entered and Authorized by:   Marga Melnick MD   Signed by:   Marga Melnick MD on 07/21/2007   Method used:   Electronically sent to ...       [facility] [phone]       [address]       [place], [place]  [address]       Ph: [phone]       Fax: [phone]   RxID:   [id number]  ]

## 2010-03-15 NOTE — Assessment & Plan Note (Signed)
Summary: FATIGUE/CBS   Vital Signs:  Patient profile:   55 year old male Weight:      134 pounds Temp:     97.9 degrees F oral Pulse rate:   72 / minute Resp:     16 per minute BP sitting:   126 / 80  (left arm) Cuff size:   large  Vitals Entered By: Shonna Chock (July 13, 2008 1:07 PM) CC: 1.) FATIGUE  2.)F/U ON LABS    CC:  1.) FATIGUE  2.)F/U ON LABS .  History of Present Illness: Fatigue  with 5# weight loss.  Labs reviewed ; vit D 18, LDL 173 & mild elevations of ALT  & AST (102 & 57). No excess Tylenol , vitamin A. Up to a bottle of wine daily. Vytorin had been taken 4-5X/week in past month. Dr Juleen China had Rxed Crestor 10 mg & Niaspan . Itching & hives with 1000 mg.  PMH of hepatitis in [place] as child.No PMH of intestinal parasites. PMH of elevated LFTs 1998(Dr Modesto Charon) ; no known cause.  Allergies: 1)  ! * Pecan 2)  Asa  Review of Systems General:  See HPI; Complains of sleep disorder; denies chills, fever, and sweats. Eyes:  Denies double vision and vision loss-both eyes. ENT:  Denies difficulty swallowing and hoarseness. CV:  Denies palpitations. Resp:  Denies cough, excessive snoring, hypersomnolence, morning headaches, and sputum productive. GI:  Denies abdominal pain, bloody stools, constipation, dark tarry stools, and diarrhea; No clay colored stool; no colonoscopy (aware of SOC). GU:  Denies discharge, dysuria, and hematuria; No coke colored urine. MS:  Complains of joint pain; denies joint redness and joint swelling; Elbow pain bilaterally. Derm:  Denies changes in nail beds, dryness, and hair loss. Neuro:  Complains of numbness and tingling; Occa N&T in hands @ night. Endo:  Denies cold intolerance and heat intolerance. Heme:  Denies abnormal bruising and bleeding.  Physical Exam  General:  well-nourished,in no acute distress; alert,appropriate and cooperative throughout examination Eyes:  No corneal or conjunctival inflammation noted.No lid lag.Perrla. No  icterus Neck:  No deformities, masses, or tenderness noted. Lungs:  Normal respiratory effort, chest expands symmetrically. Lungs are clear to auscultation, no crackles or wheezes. Heart:  Normal rate and regular rhythm. S1 and S2 normal without gallop, murmur, click, rub . S4 Abdomen:  Bowel sounds positive,abdomen soft and non-tender without masses, organomegaly or hernias noted. Extremities:  No clubbing, cyanosis, edema, or deformity noted with normal full range of motion of all joints.   No DJD noted Neurologic:  alert & oriented X3, gait normal, and DTRs symmetrical and normal.   Skin:  Intact without suspicious lesions or rashesLarge exophytic nevi (stable) Cervical Nodes:  No lymphadenopathy noted Axillary Nodes:  No palpable lymphadenopathy Psych:  memory intact for recent and remote, normally interactive, and slightly anxious.     Impression & Recommendations:  Problem # 1:  FATIGUE (ICD-780.79)  Orders: Venipuncture ([address]) TLB-CBC Platelet - w/Differential (85025-CBCD) TLB-Hepatic/Liver Function Pnl (80076-HEPATIC) TLB-TSH (Thyroid Stimulating Hormone) (84443-TSH) TLB-T4 (Thyrox), Free [phone]) TLB-T3, Free (Triiodothyronine) (84481-T3FREE) T-Hepatitis Acute Panel ([address]-829[address])  Problem # 2:  NONSPEC ELEVATION OF LEVELS OF TRANSAMINASE/LDH (ICD-790.4)  Orders: Venipuncture ([address]) TLB-Hepatic/Liver Function Pnl (80076-HEPATIC) T-Hepatitis Acute Panel ([address]-696[address])  Problem # 3:  HEPATITIS, HX OF (ICD-V12.09)  Orders: Venipuncture ([address]) T-Hepatitis Acute Panel (324[address]-10272)  Problem # 4:  VITAMIN D DEFICIENCY (ICD-268.9)  Orders: Venipuncture (53664)  Problem # 5:  UNSPECIFIED MYALGIA AND MYOSITIS (ICD-729.1)  His updated medication  list for this problem includes:    Tramadol Hcl 50 Mg Tabs (Tramadol hcl) .Marland Kitchen... 1 q 6-8 hrs as needed neck/ arm  pain  Orders: Venipuncture ([address])  Complete Medication List: 1)  Flonase 50 Mcg/act Susp (Fluticasone  propionate) .Marland Kitchen.. 1 spray each nostril once daily 2)  Tramadol Hcl 50 Mg Tabs (Tramadol hcl) .Marland Kitchen.. 1 q 6-8 hrs as needed neck/ arm  pain 3)  Vytorin 10-20 Mg Tabs (Ezetimibe-simvastatin) .Marland Kitchen.. 1 by mouth at bedtime 4)  Azelastine Hcl 0.05 % Soln (Azelastine hcl) .Marland Kitchen.. 1 gtt in each eye as needed for allergies 5)  Vitamin D3 50000 Unit Caps (cholecalciferol)  .Marland KitchenMarland KitchenMarland Kitchen 1 weekly  Patient Instructions: 1)  Limit alcohol to 1-2 glasses 2-3X/week. Complete stool cards. Stop Vytorin. Prescriptions: VITAMIN D3 50000 UNIT CAPS (CHOLECALCIFEROL) 1 weekly  #12 x 1   Entered and Authorized by:   Marga Melnick MD   Signed by:   Marga Melnick MD on 07/13/2008   Method used:   Print then Give to Patient   RxID:   [id number]

## 2010-03-15 NOTE — Miscellaneous (Signed)
Summary: PT Eval/[facility]  PT Eval/[facility]   Imported By: Lanelle Bal 02/08/2009 13:14:44  _____________________________________________________________________  External Attachment:    Type:   Image     Comment:   External Document

## 2010-03-15 NOTE — Assessment & Plan Note (Signed)
Summary: congested/cbs   Vital Signs:  Patient profile:   55 year old male Weight:      144 pounds O2 Sat:      99 % on Room air Temp:     98.4 degrees F oral Pulse rate:   78 / minute Resp:     16 per minute BP sitting:   120 / 90  (left arm)  Vitals Entered By: Jeremy Johann CMA (June 06, 2009 4:35 PM)  O2 Flow:  Room air CC: chest congestion, dry cough, drainage,  low grade fever, throat irritated Comments REVIEWED MED LIST, PATIENT AGREED DOSE AND INSTRUCTION CORRECT    CC:  chest congestion, dry cough, drainage, low grade fever, and throat irritated.  History of Present Illness: Congestion for 4-6 weeks ; this resolved  for 1-2 weeks with meds Rxed 05/01/2009.Onset 1 week ago with low grade fever & fatigue. Dr Sharyn Lull has monitored LUL nodule since 2005; it has increased but is only 8 mm; 5.5 mm in 2005.(Note: concept of "Doubling Time " discussed).Fasting labs done by Dr Sharyn Lull reviewed : TG 239, LDL 132, glucose 119, LFTS increased ( AST 49/ALT 87) on  04/07/2009 while on Vyorin 10/20 daily. He Rxed Crestor 10 mg once daily . NMR Liporofile done here 11/2008: LDL 202(3110/1995),TG385, HDL 50.  Allergies: 1)  ! * Pecan 2)  ! Vytorin 3)  Asa  Review of Systems General:  Denies chills, fever, and sweats. ENT:  Denies nasal congestion and sinus pressure; No frontal headache , facial pain or purulence. Resp:  Complains of cough and sputum productive; denies wheezing; Intermittent cough with white sputum. Allergy:  Complains of itching eyes and sneezing; Eyedrops & Flonase.  Physical Exam  General:  in no acute distress; alert,appropriate and cooperative throughout examination Ears:  External ear exam shows no significant lesions or deformities.  Otoscopic examination reveals clear canals, tympanic membranes are intact bilaterally with scarring . No bulging, retraction, inflammation or discharge. Hearing is grossly normal bilaterally. Nose:  External nasal examination  shows no deformity or inflammation. Nasal mucosa are pink and moist without lesions or exudates. Mouth:  Oral mucosa and oropharynx without lesions or exudates.  Teeth in good repair. Lungs:  Normal respiratory effort, chest expands symmetrically. Lungs are clear to auscultation, no crackles or wheezes. Heart:  Normal rate and regular rhythm. S1 and S2 normal without gallop, murmur, click, rub.S4 Pulses:  R and L carotid,radial,dorsalis pedis and posterior tibial pulses are full and equal bilaterally Extremities:  No clubbing, cyanosis, edema. Neurologic:  alert & oriented X3.   Skin:  Intact without suspicious lesions or rashes Cervical Nodes:  No lymphadenopathy noted Axillary Nodes:  No palpable lymphadenopathy Psych:  memory intact for recent and remote and subdued.     Impression & Recommendations:  Problem # 1:  COUGH (ICD-786.2) extrinsic   Problem # 2:  ALLERGIC RHINITIS (ICD-477.9)  His updated medication list for this problem includes:    Flonase 50 Mcg/act Susp (Fluticasone propionate) .Marland Kitchen... 2 sprays each nostril once daily prn    Cetirizine Hcl 10 Mg Tabs (Cetirizine hcl) .Marland Kitchen... 1 at bedtime as needed  Problem # 3:  HYPERLIPIDEMIA (ICD-272.4)  His updated medication list for this problem includes:    Crestor 10 Mg Tabs (Rosuvastatin calcium) .Marland Kitchen... 1 by mouth once daily  Problem # 4:  NONSPEC ELEVATION OF LEVELS OF TRANSAMINASE/LDH (ICD-790.4)  Complete Medication List: 1)  Tramadol Hcl 50 Mg Tabs (Tramadol hcl) .Marland Kitchen.. 1 q 6-8 hrs as needed  neck/ arm  pain 2)  Flonase 50 Mcg/act Susp (Fluticasone propionate) .... 2 sprays each nostril once daily prn 3)  Lorazepam 0.5 Mg Tabs (Lorazepam) .Marland Kitchen.. 1 at bedtime as needed 4)  Crestor 10 Mg Tabs (Rosuvastatin calcium) .Marland Kitchen.. 1 by mouth once daily 5)  Azelastine Hcl 0.05 % Soln (Azelastine hcl) .... As directed 6)  Cetirizine Hcl 10 Mg Tabs (Cetirizine hcl) .Marland Kitchen.. 1 at bedtime as needed 7)  Singulair 10 Mg Tabs (Montelukast sodium)  .Marland Kitchen.. 1 each am  Patient Instructions: 1)  Consume LESS THAN 40 grams of sugar / day from foods & drinks with High Fructose Corn Syrup as #1, 2  or # 3 on label. 2)  Please schedule a follow-up appointment in 3-4 weeks. 3)  Hepatic Panel ; CPK; 4)  Lipid Panel 272.4; 5)  HbgA1C . Codes : 272.4, 995.20, 790.29. Prescriptions: SINGULAIR 10 MG TABS (MONTELUKAST SODIUM) 1 each am  #14 x 0   Entered and Authorized by:   Marga Melnick MD   Signed by:   Marga Melnick MD on 06/06/2009   Method used:   Samples Given   RxID:   [id number]

## 2010-03-15 NOTE — Assessment & Plan Note (Signed)
Summary: DISCUSS XRAY FROM URGENT CARE/RH......Kevin Farley   Vital Signs:  Patient profile:   55 year old male Height:      66.5 inches (168.91 cm) Weight:      139 pounds (63.18 kg) BMI:     22.18 Temp:     98.4 degrees F (36.89 degrees C) oral Resp:     17 per minute BP sitting:   110 / 76  (left arm) Cuff size:   regular  Vitals Entered By: Lucious Groves CMA (September 29, 2009 9:15 AM) CC: Discuss XR from UC./kb Is Patient Diabetic? No Pain Assessment Patient in pain? no        CC:  Discuss XR from UC./kb.  History of Present Illness: Nodule LUL was 5.42mm on CT in 2005;  nodule 8 mm 04/2009. UC CXray revealed nodule to be 9mm, unchanged from 04/2009. No pulmonary symptoms ; no smoking history. UC Rxed PPI ( Omeprazole) & Sulcrafate  for burning in throat worse after food ; " only 25% better after 2 weeks".  Current Medications (verified): 1)  Flonase 50 Mcg/act Susp (Fluticasone Propionate) .... 2 Sprays Each Nostril Once Daily Prn 2)  Lorazepam 0.5 Mg Tabs (Lorazepam) .Kevin Farley.. 1 At Bedtime As Needed 3)  Crestor 10 Mg Tabs (Rosuvastatin Calcium) .Kevin Farley.. 1 By Mouth Once Daily 4)  Singulair 10 Mg Tabs (Montelukast Sodium) .Kevin Farley.. 1 Once Daily For Congestion 5)  Diclofenac Sodium Cr 100 Mg Xr24h-Tab (Diclofenac Sodium) 6)  Carafate 1 Gm Tabs (Sucralfate) .... By Mouth Qid 7)  Prilosec 40 Mg Cpdr (Omeprazole) .Kevin Farley.. 1 By Mouth Qd  Allergies (verified): 1)  ! * Pecan 2)  ! Vytorin 3)  Asa  Review of Systems General:  Denies chills, fever, sweats, and weight loss. ENT:  Denies difficulty swallowing and hoarseness. Resp:  Denies chest pain with inspiration, cough, coughing up blood, shortness of breath, and sputum productive.  Physical Exam  General:  well-nourished,in no acute distress; alert,appropriate and cooperative throughout examination Ears:  External ear exam shows no significant lesions or deformities.  Otoscopic examination reveals clear canals, tympanic membranes are intact  bilaterally without bulging, retraction, inflammation or discharge. Hearing is grossly normal bilaterally. Nose:  External nasal examination shows no deformity or inflammation. Nasal mucosa are pink and moist without lesions or exudates. Mouth:  Oral mucosa and oropharynx without lesions or exudates.  Teeth in good repair. No pharyngeal erythema.   Lungs:  Normal respiratory effort, chest expands symmetrically. Lungs are clear to auscultation, no crackles or wheezes. Cervical Nodes:  No lymphadenopathy noted Axillary Nodes:  No palpable lymphadenopathy   Impression & Recommendations:  Problem # 1:  PULMONARY NODULE, LEFT UPPER LOBE (ICD-518.89) Stable; annual CXray recommended  Problem # 2:  DYSPEPSIA (ICD-536.8)  "throat burning"  Orders: Gastroenterology Referral (GI)  Complete Medication List: 1)  Flonase 50 Mcg/act Susp (Fluticasone propionate) .... 2 sprays each nostril once daily prn 2)  Lorazepam 0.5 Mg Tabs (Lorazepam) .Kevin Farley.. 1 at bedtime as needed 3)  Crestor 10 Mg Tabs (Rosuvastatin calcium) .Kevin Farley.. 1 by mouth once daily 4)  Singulair 10 Mg Tabs (Montelukast sodium) .Kevin Farley.. 1 once daily for congestion 5)  Diclofenac Sodium Cr 100 Mg Xr24h-tab (Diclofenac sodium) 6)  Carafate 1 Gm Tabs (Sucralfate) .... By mouth qid 7)  Prilosec 40 Mg Cpdr (Omeprazole) .Kevin Farley.. 1 by mouth once daily two times a day pre meals  Patient Instructions: 1)  Avoid foods high in acid (tomatoes, citrus juices, spicy foods). Avoid eating within two hours of lying  down or before exercising. Do not over eat; try smaller more frequent meals. Elevate head of bed twelve inches when sleeping. Prescriptions: PRILOSEC 40 MG CPDR (OMEPRAZOLE) 1 by mouth once daily two times a day pre meals  #60 x 0   Entered and Authorized by:   Marga Melnick MD   Signed by:   Marga Melnick MD on 09/29/2009   Method used:   Print then Give to Patient   RxID:   [id number]

## 2010-03-15 NOTE — Progress Notes (Signed)
Summary: Lab results  Phone Note Call from Patient Call back at [phone]   Caller: Spouse: Cordelia Pen Summary of Call: Message left on VM: would like lab results   .Shonna Chock  February 24, 2009 12:31 PM   Follow-up for Phone Call        Left message on VM informing patient and his wife labs basically normal and they were mailed. Mail was probably delayed due to the weather.  Patient to f/u with Dr.Kramer if symptoms continue Follow-up by: Shonna Chock,  February 24, 2009 3:48 PM

## 2010-03-15 NOTE — Letter (Signed)
Summary: Results Follow up Letter  [facility]  [address] [place], [place] [address]   Phone: [phone]  Fax: [phone]    07/19/2008 MRN: [id number]  SIE FORMISANO [address] [place], [place]  [address]  Dear Mr. Jonel,  The following are the results of your recent test(s):  Test         Result    Pap Smear:        Normal _____  Not Normal _____ Comments: ______________________________________________________ Cholesterol: LDL(Bad cholesterol):         Your goal is less than:         HDL (Good cholesterol):       Your goal is more than: Comments:  ______________________________________________________ Mammogram:        Normal _____  Not Normal _____ Comments:  ___________________________________________________________________ Hemoccult:        Normal _____  Not normal _______ Comments:    _____________________________________________________________________ Other Tests: PLEASE SEE ATTACHED LABS DONE ON 07/18/2008    We routinely do not discuss normal results over the telephone.  If you desire a copy of the results, or you have any questions about this information we can discuss them at your next office visit.   Sincerely,

## 2010-03-15 NOTE — Letter (Signed)
Summary: Results Letter  [facility]  [address] [place], [place] [address]   Phone: [phone]  Fax: [phone]        November 01, 2009 MRN: [id number]    [facility] [address] [place], [place]  [address]    Dear Mr. Izen,   The biopsies taken during your recent upper endoscopy showed no sign of cancer or infection.  You should continue to follow the recommendations that we discussed at the time of your procedure, that is to continue PPI (antiacid medicine) daily and follow suggestions on the GERD handout.  Please feel free to call if you have any further questions or concerns.       Sincerely,  Rachael Fee MD  This letter has been electronically signed by your physician.  Appended Document: Results Letter letter mailed

## 2010-03-15 NOTE — Letter (Signed)
Summary: Results Follow up Letter  [facility] [place]  [address] [place], [place] [address]   Phone: [phone]  Fax: [phone]    06/01/2007 MRN: [id number]  Kevin Farley [address] [place], [place]  [address]  Dear [name],  The following are the results of your recent test(s):  Test         Result    Pap Smear:        Normal _____  Not Normal _____ Comments: ______________________________________________________ Cholesterol: LDL(Bad cholesterol):         Your goal is less than:         HDL (Good cholesterol):       Your goal is more than: Comments:  ______________________________________________________ Mammogram:        Normal _____  Not Normal _____ Comments:  ___________________________________________________________________ Hemoccult:        Normal _____  Not normal _______ Comments:    _____________________________________________________________________ Other Tests:  Please see attached results and comments   We routinely do not discuss normal results over the telephone.  If you desire a copy of the results, or you have any questions about this information we can discuss them at your next office visit.   Sincerely,

## 2010-03-15 NOTE — Miscellaneous (Signed)
Summary: [facility]  Clinical Lists Changes  Medications: Added new medication of MOVIPREP 100 GM  SOLR (PEG-KCL-NACL-NASULF-NA ASC-C) As per prep instructions. - Signed Rx of MOVIPREP 100 GM  SOLR (PEG-KCL-NACL-NASULF-NA ASC-C) As per prep instructions.;  #1 x 0;  Signed;  Entered by: Ezra Sites RN;  Authorized by: Rachael Fee MD;  Method used: Electronically to [facility] [phone]*, [address], [place], [place], [place]  [address], Ph: [phone], Fax: [phone] Allergies: Changed allergy or adverse reaction from ASA to ASA    Prescriptions: MOVIPREP 100 GM  SOLR (PEG-KCL-NACL-NASULF-NA ASC-C) As per prep instructions.  #1 x 0   Entered by:   Ezra Sites RN   Authorized by:   Rachael Fee MD   Signed by:   Ezra Sites RN on 11/24/2008   Method used:   [facility]ronically to        [facility] [phone]* (retail)       [address]       [place], [place]  [address]       Ph: [phone]       Fax: [phone]   RxID:   [id number]

## 2010-03-15 NOTE — Letter (Signed)
Summary: [facility] Instructions  [facility]  [address] [place], [place] [address]   Phone: [phone]  Fax: [phone]       Kevin Farley    Aug 25, 1955    MRN: [id number]        Procedure Day Dorna Bloom: Wednesday Feb. 2nd, 2011     Arrival Time: 9:30 AM     Procedure Time: 10:30 AM     Location of Procedure:                     X  [facility] (4th Floor)       PREPARATION FOR COLONOSCOPY WITH MOVIPREP   Starting 5 days prior to your procedure  Friday Jan. 28th, 2011  do not eat nuts, seeds, popcorn, corn, beans, peas,  salads, or any raw vegetables.  Do not take any fiber supplements (e.g. Metamucil, Citrucel, and Benefiber).  THE DAY BEFORE YOUR PROCEDURE         DATE: Feb. 1st, 2011  DAY: Tuesday    1.  Drink clear liquids the entire day-NO SOLID FOOD  2.  Do not drink anything colored red or purple.  Avoid juices with pulp.  No orange juice.  3.  Drink at least 64 oz. (8 glasses) of fluid/clear liquids during the day to prevent dehydration and help the prep work efficiently.  CLEAR LIQUIDS INCLUDE: Water Jello Ice Popsicles Tea (sugar ok, no milk/cream) Powdered fruit flavored drinks Coffee (sugar ok, no milk/cream) Gatorade Juice: apple, white grape, white cranberry  Lemonade Clear bullion, consomm, broth Carbonated beverages (any kind) Strained chicken noodle soup Hard Candy                             4.  In the morning, mix first dose of MoviPrep solution:    Empty 1 Pouch A and 1 Pouch B into the disposable container    Add lukewarm drinking water to the top line of the container. Mix to dissolve    Refrigerate (mixed solution should be used within 24 hrs)  5.  Begin drinking the prep at 5:00 p.m. The MoviPrep container is divided by 4 marks.   Every 15 minutes drink the solution down to the next mark (approximately 8 oz) until the full liter is complete.   6.  Follow completed prep with 16 oz of clear liquid of your  choice (Nothing red or purple).  Continue to drink clear liquids until bedtime.  7.  Before going to bed, mix second dose of MoviPrep solution:    Empty 1 Pouch A and 1 Pouch B into the disposable container    Add lukewarm drinking water to the top line of the container. Mix to dissolve    Refrigerate  THE DAY OF YOUR PROCEDURE      DATE: Feb. 2nd, 2011  DAY: Wednesday   Beginning at  5:30  a.m. (5 hours before procedure):         1. Every 15 minutes, drink the solution down to the next mark (approx 8 oz) until the full liter is complete.  2. Follow completed prep with 16 oz. of clear liquid of your choice.    3. You may drink clear liquids until 8:30 AM   (2 HOURS BEFORE PROCEDURE).   MEDICATION INSTRUCTIONS  Unless otherwise instructed, you should take regular prescription medications with a small sip of water   as early as possible the  morning of your procedure.          OTHER INSTRUCTIONS  You will need a responsible adult at least 54 years of age to accompany you and drive you home.   This person must remain in the waiting room during your procedure.  Wear loose fitting clothing that is easily removed.  Leave jewelry and other valuables at home.  However, you may wish to bring a book to read or  an iPod/MP3 player to listen to music as you wait for your procedure to start.  Remove all body piercing jewelry and leave at home.  Total time from sign-in until discharge is approximately 2-3 hours.  You should go home directly after your procedure and rest.  You can resume normal activities the  day after your procedure.  The day of your procedure you should not:   Drive   Make legal decisions   Operate machinery   Drink alcohol   Return to work  You will receive specific instructions about eating, activities and medications before you leave.    The above instructions have been reviewed and explained to me by   Ezra Sites RN  March 06, 2009 9:02  AM     I fully understand and can verbalize these instructions _____________________________ Date _________

## 2010-03-15 NOTE — Miscellaneous (Signed)
Summary: [facility]  Renewal Summary/[facility]   Imported By: Lanelle Bal 02/20/2008 09:39:41  _____________________________________________________________________  External Attachment:    Type:   Image     Comment:   External Document

## 2010-03-15 NOTE — Letter (Signed)
Summary: Results Follow up Letter  [facility] [place]  [address] [place], [place] [address]   Phone: [phone]  Fax: [phone]    05/07/2007 MRN: [id number]  Kevin Farley [address] [place], [place]  [address]  Dear Mr. Kaspar,  The following are the results of your recent test(s):  Test         Result    Pap Smear:        Normal _____  Not Normal _____ Comments: ______________________________________________________ Cholesterol: LDL(Bad cholesterol):         Your goal is less than:         HDL (Good cholesterol):       Your goal is more than: Comments:  ______________________________________________________ Mammogram:        Normal _____  Not Normal _____ Comments:  ___________________________________________________________________ Hemoccult:        Normal _____  Not normal _______ Comments:    _____________________________________________________________________ Other Tests:  Please see attached results and comments   We routinely do not discuss normal results over the telephone.  If you desire a copy of the results, or you have any questions about this information we can discuss them at your next office visit.   Sincerely,

## 2010-03-15 NOTE — Letter (Signed)
Summary: Results Follow up Letter  [name]  [address] [place], [place] [address]   Phone: [phone]  Fax: [phone]    07/08/2008 MRN: [id number]  Kevin Farley [address] [place], [place]  [address]  Dear [name],  The following are the results of your recent test(s):  Test         Result    Pap Smear:        Normal _____  Not Normal _____ Comments: ______________________________________________________ Cholesterol: LDL(Bad cholesterol):         Your goal is less than:         HDL (Good cholesterol):       Your goal is more than: Comments:  ______________________________________________________ Mammogram:        Normal _____  Not Normal _____ Comments:  ___________________________________________________________________ Hemoccult:        Normal _____  Not normal _______ Comments:    _____________________________________________________________________ Other Tests: Please see attached labs done on 07/07/2008    We routinely do not discuss normal results over the telephone.  If you desire a copy of the results, or you have any questions about this information we can discuss them at your next office visit.   Sincerely,

## 2010-03-15 NOTE — Progress Notes (Signed)
Summary: **Lab Results**  Phone Note Call from Patient Call back at [phone]   Caller: Spouse Summary of Call: Message left on voicemail: patient would like to know if he has to come in on Monday please call cell.  Tried to reach patient x 3 on cell number-message stated "The Number you are trying to call is not reachable"   Chrae [facility]  July 15, 2008 12:48 PM   Follow-up for Phone Call        Tried patient again and spoke with wife-per Dr.Hopper keep appointment on Monday-(minimal elevation of most sensitive LFT) to Recheck Fasting alt/ast. Patient's wife ok'd. Other labs normal. Copy will be given to patient on Monday. Follow-up by: Shonna Chock,  July 15, 2008 12:55 PM

## 2010-03-15 NOTE — Progress Notes (Signed)
Summary: Continue Physical Therapy  Phone Note Call from Patient Call back at [phone]   Caller: sherry Summary of Call: Order for patient to Restart Physical Therapy-Ongoing Neck/Arm pain. Dr.Hopper please advise Initial call taken by: Shonna Chock,  November 16, 2007 2:58 PM  Follow-up for Phone Call        OK Follow-up by: Marga Melnick MD,  November 16, 2007 5:47 PM  Additional Follow-up for Phone Call Additional follow up Details #1::        referral done Additional Follow-up by: Charolette Child,  November 17, 2007 11:16 AM

## 2010-03-15 NOTE — Assessment & Plan Note (Signed)
Summary: cpx / review lab/cbs   Vital Signs:  Patient profile:   55 year old male Height:      66.5 inches Weight:      141 pounds Temp:     98.1 degrees F oral Resp:     14 per minute BP sitting:   110 / 76  (left arm) Cuff size:   regular  Vitals Entered By: Shonna Chock (July 14, 2009 2:39 PM)  Comments REVIEWED MED LIST, PATIENT AGREED DOSE AND INSTRUCTION CORRECT    History of Present Illness: [name] is here for a physical; he has had some extrinsic allergic symptoms recently. He  continues to have shoulder pain for which he is seeing Physical Therapy with some benefit. Tramadol has caused dizziness. Dr Farris Has had  discussed  surgey for shoulder.  Lipid Management History:      Positive NCEP/ATP III risk factors include male age 66 years old or older and family history for ischemic heart disease (males less than 51 years old).  Negative NCEP/ATP III risk factors include non-diabetic, non-tobacco-user status, non-hypertensive, no ASHD (atherosclerotic heart disease), no prior stroke/TIA, no peripheral vascular disease, and no history of aortic aneurysm.     Preventive Screening-Counseling & Management  Caffeine-Diet-Exercise     Does Patient Exercise: yes  Allergies: 1)  ! * Pecan 2)  ! Vytorin 3)  Jonne Ply  Past History:  Past Medical History:  HYPERLIPIDEMIA (ICD-272.4): [place] LDL goal = < 130. NMR Lipoprofile 11/2008: LDL 202(3110/1995), TG 385,HDL 50. ALLERGIC RHINITIS (ICD-477.9) Supraspinatus tear; Cervical Stenosis with radicular pain, Dr Farris Has Colonic polyps,Adenomatous ,hx of 03/2009, Dr Christella Hartigan  Past Surgical History: Cornea transplant OS back surgery  for fragment  @  L4-5, Dr Trey Sailors, NS nasal bone  surgery, Dr Lazarus Salines 2005 negative cardiac  catherization, done for shoulder pain and elevated lipids Colon polypectomy 03/2009 , Dr Christella Hartigan ,repeat due 2014  Family History: mother :diabetes,hypertension, cva father :diabetes, hypertension, open heart  surgery brother: diabetes, htn, MI @ 33; brother: cva;  sister:? diabetes, hypertension; sister: diabetes  Social History: Never Smoked regular diet Occupation:Manager of  hotels/ motels Married Alcohol use-yes: occasionally Regular exercise-yes: 2X/week   Review of Systems  The patient denies anorexia, fever, weight loss, weight gain, vision loss, decreased hearing, hoarseness, chest pain, syncope, dyspnea on exertion, peripheral edema, prolonged cough, headaches, hemoptysis, abdominal pain, melena, hematochezia, severe indigestion/heartburn, hematuria, incontinence, suspicious skin lesions, depression, unusual weight change, abnormal bleeding, and enlarged lymph nodes.   Allergy:  Complains of itching eyes, seasonal allergies, and sneezing; denies hives or rash; Rx: Complimentary Medicine therapie with response.  Physical Exam  General:  well-nourished; alert,appropriate and cooperative throughout examination Head:  Normocephalic and atraumatic without obvious abnormalities. No apparent alopecia Eyes:  No corneal or conjunctival inflammation noted.  Perrla. Funduscopic exam benign, without hemorrhages, exudates or papilledema. Decreased light reflex OS Ears:  External ear exam shows no significant lesions or deformities.  Otoscopic examination reveals clear canals,old perforation R tympanic membranes wuith scarring of L TM w/o  inflammation or discharge. Hearing is grossly normal bilaterally. Nose:  External nasal examination shows no deformity or inflammation. Nasal mucosa are pink and moist without lesions or exudates. Mouth:  Oral mucosa and oropharynx without lesions or exudates.  Teeth in good repair. Neck:  No deformities, masses, or tenderness noted. Lungs:  Normal respiratory effort, chest expands symmetrically. Lungs are clear to auscultation, no crackles or wheezes. Heart:  normal rate, regular rhythm, no gallop, no rub,  no JVD, no HJR, and grade 1 /6 systolic murmur.     Abdomen:  Bowel sounds positive,abdomen soft and non-tender without masses, organomegaly or hernias noted. Rectal:  S/P Colonoscopy 03/2009 Genitalia:  Testes bilaterally descended without nodularity, tenderness or masses. No scrotal masses or lesions. No penis lesions or urethral discharge. L varicocele.   Prostate:  see above Msk:  No deformity or scoliosis noted of thoracic or lumbar spine.   Pulses:  R and L carotid,radial,dorsalis pedis and posterior tibial pulses are full and equal bilaterally Extremities:  No clubbing, cyanosis, edema, or deformity noted with normal full range of motion of all joints.   Neurologic:  alert & oriented X3 and DTRs symmetrical and normal.   Skin:  Intact without suspicious lesions or rashes Cervical Nodes:  No lymphadenopathy noted Axillary Nodes:  No palpable lymphadenopathy Inguinal Nodes:  No significant adenopathy Psych:  memory intact for recent and remote, normally interactive, and good eye contact.     Impression & Recommendations:  Problem # 1:  ROUTINE GENERAL MEDICAL EXAM@HEALTH  CARE FACL (ICD-V70.0)  Orders: EKG w/ Interpretation (93000)  Problem # 2:  HYPERLIPIDEMIA (ICD-272.4)  His updated medication list for this problem includes:    Crestor 10 Mg Tabs (Rosuvastatin calcium) .Marland Kitchen... 1 by mouth once daily  Problem # 3:  SHOULDER PAIN, LEFT (ICD-719.41)  His updated medication list for this problem includes:    Tramadol Hcl 50 Mg Tabs (Tramadol hcl) .Marland Kitchen... 1 q 6-8 hrs as needed neck/ arm  pain  Problem # 4:  CORONARY ARTERY DISEASE, PREMATURE, FAMILY HX (ICD-V17.3) brother MI @ 50 Orders: EKG w/ Interpretation (93000)  Problem # 5:  ALLERGIC RHINITIS (ICD-477.9)  The following medications were removed from the medication list:    Cetirizine Hcl 10 Mg Tabs (Cetirizine hcl) .Marland Kitchen... 1 at bedtime as needed His updated medication list for this problem includes:    Flonase 50 Mcg/act Susp (Fluticasone propionate) .Marland Kitchen... 2 sprays each  nostril once daily prn  Problem # 6:  COLONIC POLYPS, HX OF (ICD-V12.72) repeat colonoscopy 2014 Orders: EKG w/ Interpretation (93000)  Complete Medication List: 1)  Tramadol Hcl 50 Mg Tabs (Tramadol hcl) .Marland Kitchen.. 1 q 6-8 hrs as needed neck/ arm  pain 2)  Flonase 50 Mcg/act Susp (Fluticasone propionate) .... 2 sprays each nostril once daily prn 3)  Lorazepam 0.5 Mg Tabs (Lorazepam) .Marland Kitchen.. 1 at bedtime as needed 4)  Crestor 10 Mg Tabs (Rosuvastatin calcium) .Marland Kitchen.. 1 by mouth once daily  Lipid Assessment/Plan:      Based on NCEP/ATP III, the patient's risk factor category is "2 or more risk factors and a calculated 10 year CAD risk of > 20%".  The patient's lipid goals are as follows: Total cholesterol goal is 200; LDL cholesterol goal is 130; HDL cholesterol goal is 40; Triglyceride goal is 150.  His LDL cholesterol goal has not been met.  Secondary causes for hyperlipidemia have been ruled out.  He has been counseled on adjunctive measures for lowering his cholesterol and has been provided with dietary instructions.    Patient Instructions: 1)  Consume LESS THAN 40 grams of "sugar"/ day from High Fructose Corn Syrup sources.Please schedule a follow-up appointment in 6 months. 2)  Lipid Panel prior to visit, ICD-9:272.4.Your  MINIMAL LDL goal =< 130. Prescriptions: CRESTOR 10 MG TABS (ROSUVASTATIN CALCIUM) 1 by mouth once daily  #30 x 5   Entered and Authorized by:   Marga Melnick MD   Signed by:   Marga Melnick  MD on 07/14/2009   Method used:   Print then Give to Patient   RxID:   [id number]

## 2010-03-15 NOTE — Assessment & Plan Note (Signed)
Summary: ACUTE/CRAMPS ARMS LEG/ALR   Vital Signs:  Patient profile:   55 year old male Height:      66 inches Weight:      136 pounds Temp:     98.0 degrees F oral Pulse rate:   66 / minute Resp:     16 per minute BP sitting:   100 / 80  (left arm)  Vitals Entered By: Jeremy Johann CMA (September 07, 2008 2:57 PM) CC: cramps in arms and legs x46month, cramps increase when at rest   CC:  cramps in arms and legs x51month and cramps increase when at rest.  History of Present Illness: Cramps in arms & legs  X 4-5 weeks, worse @ night. He is not on statin; he is on vit D 50,000 International Units ONCE WEEKLY. PMH of chronic upper back symptoms; he has has seen Dr Channing Mutters.  Allergies: 1)  ! * Pecan 2)  Asa  Review of Systems MS:  Complains of muscle aches, cramps, and thoracic pain; denies joint redness, joint swelling, low back pain, mid back pain, and muscle weakness; Minor joint symptoms. Chronic upper back X 10 + years..  Physical Exam  General:  well-nourished,in no acute distress; alert,appropriate and cooperative throughout examination Extremities:  No clubbing, cyanosis, edema, or deformity noted with normal full range of motion of all joints.   No muscle tenderness to palpation Neurologic:  strength normal in all extremities and DTRs symmetrical and normal.     Impression & Recommendations:  Problem # 1:  UNSPECIFIED MYALGIA AND MYOSITIS (ICD-729.1)  His updated medication list for this problem includes:    Tramadol Hcl 50 Mg Tabs (Tramadol hcl) .Marland Kitchen... 1 q 6-8 hrs as needed neck/ arm  pain  Orders: Venipuncture (04540) TLB-CK Total Only(Creatine Kinase/CPK) (82550-CK)  Problem # 2:  VITAMIN D DEFICIENCY (ICD-268.9)  Orders: Venipuncture (98119) T-Vitamin D (25-Hydroxy) (14782-95621)  Problem # 3:  ARTHRALGIA (ICD-719.40)  Orders: Venipuncture (30865) TLB-Uric Acid, Blood (84550-URIC) TLB-Rheumatoid Factor (RA) (78469-GE) TLB-Sedimentation Rate (ESR)  (85652-ESR)  Problem # 4:  FATIGUE (ICD-780.79)  Orders: TLB-CBC Platelet - w/Differential (85025-CBCD)  Complete Medication List: 1)  Tramadol Hcl 50 Mg Tabs (Tramadol hcl) .Marland Kitchen.. 1 q 6-8 hrs as needed neck/ arm  pain 2)  Vitamin D3 50000 Unit Caps (cholecalciferol)  .Marland KitchenMarland KitchenMarland Kitchen 1 weekly  Patient Instructions: 1)  Co Enzyme Q 10 once daily until results back

## 2010-03-15 NOTE — Progress Notes (Signed)
Summary: Med Change   lm 1/13  Phone Note Call from Patient Call back at Work Phone [phone]   Caller: Spouse Summary of Call: Patient's spouse called this morning stating that he was having muscle pain side effect from the Lipitor and he spoke with Dr. Alwyn Ren about this at last OV. Dr. Alwyn Ren never gave him a rx for this new drug he wanted him to try in substitute for the Liptor. Please advise.  Initial call taken by: Harold Barban,  February 22, 2010 10:50 AM  Follow-up for Phone Call        Dr.Hopper please advise  Follow-up by: Shonna Chock CMA,  February 22, 2010 2:15 PM  Additional Follow-up for Phone Call Additional follow up Details #1::        He  saw Dr Sharyn Lull for cholesterol management; the prescribing MD will need to change meds. A CPK test would tell whether the muscles are being affected by the Lipitor (995.20). I had recommended he consider a fasting [place] Heart Panel (1304X) if he wanted to optimally assess his long term cardiac  risk Additional Follow-up by: Marga Melnick MD,  February 22, 2010 6:27 PM    Additional Follow-up for Phone Call Additional follow up Details #2::    Left message on voicemail to call back to office. Lucious Groves CMA  February 23, 2010 9:22 AM    Appended Document: Med Change   lm 1/13 Patient notified and will schedule [place] heart panel.

## 2010-03-15 NOTE — Letter (Signed)
Summary: EGD Instructions  [facility]  [address] [place], [place] [address]   Phone: [phone]  Fax: [phone]       Kevin Farley    [birth year]    MRN: [id number]       Procedure Day /Date:10/27/09 FRI     Arrival Time: 3 pm     Procedure Time:4 pm     Location of Procedure:                    [facility] (4th Floor)   PREPARATION FOR ENDOSCOPY   On 10/27/09  THE DAY OF THE PROCEDURE:  1.   No solid foods, milk or milk products are allowed after midnight the night before your procedure.  2.   Do not drink anything colored red or purple.  Avoid juices with pulp.  No orange juice.  3.  You may drink clear liquids until 2 pm, which is 2 hours before your procedure.                                                                                                CLEAR LIQUIDS INCLUDE: Water Jello Ice Popsicles Tea (sugar ok, no milk/cream) Powdered fruit flavored drinks Coffee (sugar ok, no milk/cream) Gatorade Juice: apple, white grape, white cranberry  Lemonade Clear bullion, consomm, broth Carbonated beverages (any kind) Strained chicken noodle soup Hard Candy   MEDICATION INSTRUCTIONS  Unless otherwise instructed, you should take regular prescription medications with a small sip of water as early as possible the morning of your procedure.             OTHER INSTRUCTIONS  You will need a responsible adult at least 54 years of age to accompany you and drive you home.   This person must remain in the waiting room during your procedure.  Wear loose fitting clothing that is easily removed.  Leave jewelry and other valuables at home.  However, you may wish to bring a book to read or an iPod/MP3 player to listen to music as you wait for your procedure to start.  Remove all body piercing jewelry and leave at home.  Total time from sign-in until discharge is approximately 2-3 hours.  You should go home directly after your  procedure and rest.  You can resume normal activities the day after your procedure.  The day of your procedure you should not:   Drive   Make legal decisions   Operate machinery   Drink alcohol   Return to work  You will receive specific instructions about eating, activities and medications before you leave.    The above instructions have been reviewed and explained to me by   _______________________    I fully understand and can verbalize these instructions _____________________________ Date _________

## 2010-03-15 NOTE — Assessment & Plan Note (Signed)
Summary: CPX//CA   Vital Signs:  Patient Profile:   55 Years Old Male Weight:      142.50 pounds Pulse rate:   56 / minute Pulse rhythm:   regular Resp:     12 per minute BP sitting:   102 / 68  (left arm) Cuff size:   large  Pt. in pain?   no  Vitals Entered By: Wendall Stade (May 25, 2007 10:47 AM)                  Chief Complaint:  cpx.  Lipid Management History:      Positive NCEP/ATP III risk factors include male age 52 years old or older and family history for ischemic heart disease (males less than 43 years old).  Negative NCEP/ATP III risk factors include non-diabetic, non-tobacco-user status, non-hypertensive, no ASHD (atherosclerotic heart disease), no prior stroke/TIA, no peripheral vascular disease, and no history of aortic aneurysm.       Current Allergies (reviewed today): ! * PECAN ASA  Past Medical History:    Reviewed history from 03/30/2006 and no changes required:       Allergic rhinitis       Current Problems:        OTHER AND UNSPECIFIED HYPERLIPIDEMIA (ICD-272.4)       SINUSITIS, ACUTE MAXILLARY (ICD-461.0)       HYPERCHOLESTEROLEMIA (ICD-272.0)       ALLERGIC RHINITIS (ICD-477.9)  Past Surgical History:    cornea transplant    back surgery fragment  L4-5, Dr Channing Mutters, NS    nose surgery    2005 negative catherization done for shoulder pain and elevated lipids   Family History:    mother diabetes,hypertension, cva    father diabetes, hypertension, open heart surgery    brother diabetes, htn, MI @ 60, one brother , cva; 2nd brother diabetes    sister? diabetes, hypertension  Social History:    Reviewed history and no changes required:       Never Smoked       regular diet   Risk Factors:  Tobacco use:  never Alcohol use:  yes    Type:  Socially Exercise:  yes    Type:  CVE 2X/ week w/o ymptoms  Family History Risk Factors:    Family History of MI in females < 35 years old:  no    Family History of MI in males < 41 years old:   yes   Review of Systems  General      Denies chills, fever, sleep disorder, sweats, and weight loss.  Eyes      Denies blurring, double vision, eye pain, and vision loss-both eyes.  ENT      Complains of nosebleeds.      Denies decreased hearing, difficulty swallowing, earache, nasal congestion, and sinus pressure.      DR Lazarus Salines treated epistaxsis  CV      Denies bluish discoloration of lips or nails, chest pain or discomfort, difficulty breathing at night, difficulty breathing while lying down, leg cramps with exertion, palpitations, shortness of breath with exertion, swelling of feet, and swelling of hands.  Resp      Denies cough, hypersomnolence, morning headaches, shortness of breath, and sputum productive.  GI      Denies abdominal pain, bloody stools, change in bowel habits, constipation, dark tarry stools, diarrhea, indigestion, nausea, and vomiting.  GU      Denies decreased libido, dysuria, erectile dysfunction, hematuria, and nocturia.  MS  Complains of joint pain and low back pain.      LBS better post PT; occa neck pain  with occa pain LUE > RUE  Derm      Denies changes in color of skin, changes in nail beds, dryness, hair loss, and lesion(s).  Neuro      Denies difficulty with concentration, disturbances in coordination, numbness, poor balance, and tingling.  Psych      Denies anxiety, depression, easily angered, easily tearful, and irritability.  Endo      Denies cold intolerance, excessive hunger, excessive thirst, excessive urination, heat intolerance, polyuria, and weight change.  Heme      Denies abnormal bruising and bleeding.  Allergy      Complains of itching eyes, seasonal allergies, and sneezing.      Topical spray from Dr Lazarus Salines   Physical Exam  General:     Well-developed,well-nourished,in no acute distress; alert,appropriate and cooperative throughout examination Head:     Normocephalic and atraumatic without obvious  abnormalities. No apparent alopecia or balding. Eyes:     No corneal or conjunctival inflammation noted.  Perrla. Funduscopic exam benign, without hemorrhages, exudates or papilledema. Lens implant  OS. Ears:     External ear exam shows no significant lesions or deformities.  Otoscopic examination reveals clear canals, tympanic membranes are scarred bilaterally. Hearing is grossly normal bilaterally. Nose:     External nasal examination shows no deformity or inflammation. Nasal mucosa are pink and moist without lesions or exudates. Mouth:     Oral mucosa and oropharynx without lesions or exudates.  Teeth in good repair. Neck:     No deformities, masses, or tenderness noted. Lungs:     Normal respiratory effort, chest expands symmetrically. Lungs are clear to auscultation, no crackles or wheezes. Heart:     Normal rate and regular rhythm. S1 and S2 normal without gallop, murmur, click, rub. S4 with slurring. Abdomen:     Bowel sounds positive,abdomen soft and non-tender without masses, organomegaly or hernias noted. Rectal:     Given stool cards; Standard of Care ( Screening Colonoscopy) discussed Genitalia:     Testes bilaterally descended without nodularity, tenderness or masses. No scrotal masses or lesions. No penis lesions or urethral discharge. Prostate:     PSA drawn Msk:     No deformity or scoliosis noted of thoracic or lumbar spine.   Pulses:     R and L carotid,radial,dorsalis pedis and posterior tibial pulses are full and equal bilaterally Extremities:     No clubbing, cyanosis, edema, or deformity noted with normal full range of motion of all joints.   Neurologic:     alert & oriented X3, cranial nerves II-XII intact, strength normal in all extremities, and DTRs symmetrical and normal.   Skin:     Intact without suspicious lesions or rashesKeratoses of abdomen Cervical Nodes:     No lymphadenopathy noted Axillary Nodes:     No palpable lymphadenopathy Psych:      memory intact for recent and remote, normally interactive, good eye contact, not anxious appearing, and not depressed appearing.      Impression & Recommendations:  Problem # 1:  ROUTINE GENERAL MEDICAL EXAM@HEALTH  CARE FACL (ICD-V70.0)  Orders: EKG w/ Interpretation (93000) TLB-BMP (Basic Metabolic Panel-BMET) (80048-METABOL) TLB-CBC Platelet - w/Differential (85025-CBCD) TLB-Hepatic/Liver Function Pnl (80076-HEPATIC) TLB-TSH (Thyroid Stimulating Hormone) (84443-TSH) TLB-PSA (Prostate Specific Antigen) (84153-PSA) TLB-A1C / Hgb A1C (Glycohemoglobin) (83036-A1C)   Problem # 2:  HYPERLIPIDEMIA (ICD-272.4)  His updated medication list for  this problem includes:    Vytorin 10-20 Mg Tabs (Ezetimibe-simvastatin) .Marland Kitchen... 1 by mouth once daily   Problem # 3:  CERVICAL RADICULOPATHY (ICD-723.4) Ultram Rxed  Problem # 4:  FAMILY HISTORY OF DIABETES MELLITUS (ICD-V18.0) Mother & 2 brothers Orders: TLB-A1C / Hgb A1C (Glycohemoglobin) (83036-A1C)   Complete Medication List: 1)  Vytorin 10-20 Mg Tabs (Ezetimibe-simvastatin) .Marland Kitchen.. 1 by mouth once daily 2)  Flonase 50 Mcg/act Susp (Fluticasone propionate) .Marland Kitchen.. 1 spray each nostril once daily 3)  Tramadol Hcl 50 Mg Tabs (Tramadol hcl) .Marland Kitchen.. 1 q 6-8 hrs as needed neck/ arm  pain  Lipid Assessment/Plan:      Based on NCEP/ATP III, the patient's risk factor category is "2 or more risk factors and a calculated 10 year CAD risk of < 20%".  From this information, the patient's calculated lipid goals are as follows: Total cholesterol goal is 200; LDL cholesterol goal is 130; HDL cholesterol goal is 40; Triglyceride goal is 150.  His LDL cholesterol goal has not been met.  Secondary causes for hyperlipidemia have been ruled out.  He has been counseled on adjunctive measures for lowering his cholesterol and has been provided with dietary instructions.     Patient Instructions: 1)  Complete stool cards & consider screening colonoscopy as discussed.  Follow a low carb diet ( The New Sugar Busters or The Flat Belly Diet). 2)  Hepatic Panel prior to visit, ICD-9: 995.20 3)  Lipid Panel prior to visit, ICD-9:272.4    Prescriptions: TRAMADOL HCL 50 MG  TABS (TRAMADOL HCL) 1 q 6-8 hrs as needed neck/ arm  pain  #30 x 1   Entered and Authorized by:   Marga Melnick MD   Signed by:   Marga Melnick MD on 05/25/2007   Method used:   Print then Give to Patient   RxID:   [id number] VYTORIN 10-20 MG  TABS (EZETIMIBE-SIMVASTATIN) 1 by mouth once daily  #30 x 11   Entered and Authorized by:   Marga Melnick MD   Signed by:   Marga Melnick MD on 05/25/2007   Method used:   Print then Give to Patient   RxID:   [id number]  ]

## 2010-03-15 NOTE — Progress Notes (Signed)
  Phone Note Refill Request Message from:  Patient  Refills Requested: Medication #1:  VITAMIN D3 50000 UNIT CAPS (CHOLECALCIFEROL) 1 weekly. PT SAYSOUT NEED RX TO [facility]  Initial call taken by: Kandice Hams,  September 15, 2008 1:37 PM    Prescriptions: VITAMIN D3 50000 UNIT CAPS (CHOLECALCIFEROL) 1 weekly  #12 x 1   Entered by:   Kandice Hams   Authorized by:   Marga Melnick MD   Signed by:   Kandice Hams on 09/15/2008   Method used:   Faxed to ...       [facility] [phone]* (retail)       [address]       [place], [place]  [address]       Ph: [phone]       Fax: [phone]   RxID:   [id number]   Appended Document:  PT ALSO INFORMEDD OF LAB RESULT OF VIT D READ RESULTS AND INFORMED PT LETTER WAS MAILED 09/13/08

## 2010-03-15 NOTE — Assessment & Plan Note (Signed)
History of Present Illness Visit Type: consult  Primary GI MD: Rob Bunting MD Primary : Kevin Melnick, MD  Requesting : Kevin Melnick, MD  Chief Complaint: dyspepsia History of Present Illness:     very pleasant 55 year old Kevin Farley man who is here with his wife today.  who has had burning in throat for the past 1-2 monhts.  Worse at night, laying down.  will spit up phlegm, fluid at night and in AM.  he has been taking prilosec for 2 months.  30 min prior to breakfast, 2-3 hours before dinner..  Since starting the prilosec he feels alot better but not  perfect.  he usually eats a very late dinner, often finishing around 9 to 9:30 PM and then he will generally go to bed shortly thereafter. He will usually have 2-3 glasses of wine with his dinner meal.  Coffee makes it worse.           Current Medications (verified): 1)  Flonase 50 Mcg/act Susp (Fluticasone Propionate) .... 2 Sprays Each Nostril Once Daily Prn 2)  Prilosec 40 Mg Cpdr (Omeprazole) .Marland Kitchen.. 1 By Mouth  Two Times A Day Pre Meals 3)  Multivitamins   Tabs (Multiple Vitamin) .... One Tablet By Mouth Once Daily  Allergies (verified): 1)  ! * Pecan 2)  ! Vytorin 3)  Jonne Ply  Past History:  Past Medical History:  HYPERLIPIDEMIA (ICD-272.4): Framingham LDL goal = < 130. NMR Lipoprofile 11/2008: LDL 202(3110/1995), TG 385,HDL 50. ALLERGIC RHINITIS (ICD-477.9) Supraspinatus tear; Cervical Stenosis with radicular pain, Dr Farris Has Colonic polyps, Adenomatous ,hx of 03/2009, Dr Christella Hartigan  GERD  Past Surgical History: Cornea transplant OS back surgery  for fragment  @  L4-5, Dr Trey Sailors, NS nasal bone  surgery, Dr Lazarus Salines 2005 negative cardiac  catherization, done for shoulder pain and elevated lipids Colon polypectomy 03/2009 , Dr Christella Hartigan ,repeat due 2014   Family History: mother :diabetes,hypertension, cva father :diabetes, hypertension, open heart surgery brother: diabetes, htn, MI @ 28; brother: cva;   sister:? diabetes, hypertension; sister: diabetes   Social History: Never Smoked regular diet Occupation:Manager of  hotels/ motels Married Alcohol use-yes, two glasses of wine with dinner every night  Regular exercise-yes: 2X/week  coffee (1 a day)   Review of Systems       Pertinent positive and negative review of systems were noted in the above HPI and GI specific review of systems.  All other review of systems was otherwise negative.   Vital Signs:  Patient profile:   55 year old male Height:      66.5 inches Weight:      139 pounds BMI:     22.18 BSA:     1.72 Pulse rate:   78 / minute Pulse rhythm:   regular BP sitting:   110 / 64  (left arm) Cuff size:   regular  Vitals Entered By: Ok Anis CMA (October 18, 2009 10:28 AM)  Physical Exam  Additional Exam:  Constitutional: generally well appearing Psychiatric: alert and oriented times 3 Eyes: extraocular movements intact Mouth: oropharynx moist, no lesions Neck: supple, no lymphadenopathy Cardiovascular: heart regular rate and rythm Lungs: CTA bilaterally Abdomen: soft, non-tender, non-distended, no obvious ascites, no peritoneal signs, normal bowel sounds Extremities: no lower extremity edema bilaterally Skin: no lesions on visible extremities    Impression & Recommendations:  Problem # 1:  GERD he does not have any alarm symptoms of dysphagia or weight loss or anemia however his symptoms have been  going on for many years and I think we should proceed with EGD to rule out GERD complications such as Barrett's esophagus significant esophagitis. I think some lifestyle modifications may help his GERD such as not eating within 3 hours of laying down for bed. I have given him a GERD handout which will explain this to him even further. He should also change the way he is taking his proton pump inhibitor so that it is 20-30 minutes prior to a meal as that is the way the pill is designed to work most  effectively.  Patient Instructions: 1)  GERD handout given (try not to eat within 3 hours of laying down for bed).  Alcolohol can contribute to GERD. 2)  You should change the way you are taking your antiacid medicine (prilsoec ) so that you are taking it 20-30 minutes prior to breakfast and dinner meals. 3)  You will be scheduled to have an upper endoscopy. 4)  The medication list was reviewed and reconciled.  All changed / newly prescribed medications were explained.  A complete medication list was provided to the patient / caregiver.  Appended Document: Orders Update/EGD    Clinical Lists Changes  Problems: Added new problem of DYSPEPSIA (ICD-536.8) Orders: Added new Test order of EGD (EGD) - Signed

## 2010-03-15 NOTE — Assessment & Plan Note (Signed)
Summary: PAIN IN BOTH SHOULDER//PH   Vital Signs:  Patient profile:   55 year old male Weight:      142.6 pounds Temp:     98.0 degrees F oral Pulse rate:   72 / minute Resp:     16 per minute BP sitting:   122 / 80  (left arm) Cuff size:   large  Vitals Entered By: Shonna Chock (February 16, 2009 2:34 PM) CC: Shoulder tightness x several months Comments REVIEWED MED LIST, PATIENT AGREED DOSE AND INSTRUCTION CORRECT    CC:  Shoulder tightness x several months.  History of Present Illness: Exacerabation of chronic neck pain,  present 3 years,  over past  2-3 months . Tingling in arms when sitting with tightenting of muscles. ? cold weather may be a trigger.PMH of LS spine surgery by Dr Channing Mutters.He evaluated these symptoms in 2009  & Rxed PT with help.Dr Farris Has did MRI s of shoulder & cervical spine. Possible C7 nerve root encroachment due to spondylosis. Also changes @ C4-6 w/o cord compression.Supraspinatus tendinosis. PMH of vitamin D deficiency ; last level was 35. Off vitamin D X 1 month. Crepitus of joints.  Allergies: 1)  ! * Pecan 2)  Asa  Review of Systems General:  Denies chills, fever, sweats, and weight loss. GU:  Denies incontinence. Neuro:  Complains of numbness and tingling; N&T in RUE > LUE @ computer.  Physical Exam  General:  well-nourished,in no acute distress; alert,appropriate and cooperative throughout examination Neck:  Full ROM of neck Extremities:  No clubbing, cyanosis, edema. Neurologic:  alert & oriented X3, strength normal in all extremities, and DTRs symmetrical and normal.   Skin:  Intact without suspicious lesions or rashes Cervical Nodes:  No lymphadenopathy noted Axillary Nodes:  No palpable lymphadenopathy   Impression & Recommendations:  Problem # 1:  CERVICAL RADICULOPATHY (ICD-723.4)  Orders: Physical Therapy Referral (PT)  Problem # 2:  UNSPECIFIED MYALGIA AND MYOSITIS (ICD-729.1)  His updated medication list for this problem  includes:    Tramadol Hcl 50 Mg Tabs (Tramadol hcl) .Marland Kitchen... 1 q 6-8 hrs as needed neck/ arm  pain  Orders: TLB-Sedimentation Rate (ESR) (85652-ESR) TLB-CK Total Only(Creatine Kinase/CPK) (82550-CK) T-Vitamin D (25-Hydroxy) ([address])  Problem # 3:  VITAMIN D DEFICIENCY (ICD-268.9) PMH of   Complete Medication List: 1)  Tramadol Hcl 50 Mg Tabs (Tramadol hcl) .Marland Kitchen.. 1 q 6-8 hrs as needed neck/ arm  pain 2)  Flonase 50 Mcg/act Susp (Fluticasone propionate) .... 2 sprays each nostril once daily 3)  Vytorin 10-20 Mg Tabs (Ezetimibe-simvastatin) .Marland Kitchen.. 1 at bedtime 4)  Lorazepam 0.5 Mg Tabs (Lorazepam) .Marland Kitchen.. 1 at bedtime as needed  Patient Instructions: 1)  GLUCOSAMINE SULFATE 1500 MG once daily  FOR 3 MONTHS ON , THEN 2 MONTHS OFF. Prescriptions: LORAZEPAM 0.5 MG TABS (LORAZEPAM) 1 at bedtime as needed  #30 x 2   Entered and Authorized by:   Marga Melnick MD   Signed by:   Marga Melnick MD on 02/16/2009   Method used:   Print then Give to Patient   RxID:   [id number]

## 2010-03-15 NOTE — Progress Notes (Signed)
Summary: fyi lab apptcpx [id number] hopper   Phone Note Call from Patient   Summary of Call: lab scheduled [phone] per append after pt on vytonin 10 weeks - cpx snheduled [id number] Initial call taken by: Okey Regal Spring,  March 17, 2007 11:57 AM

## 2010-03-15 NOTE — Letter (Signed)
Summary: Previsit letter  [facility]  [address] [place], [place] [address]   Phone: [phone]  Fax: [phone]       02/22/2009 MRN: [id number]  Kevin Farley [address] [place], [place]  [address]  Dear [name],  Welcome to the Gastroenterology Division at [facility]    You are scheduled to see a nurse for your pre-procedure visit on 03/06/2009 at 8:30AM on the 3rd floor at [facility], [address].  We ask that you try to arrive at our office 15 minutes prior to your appointment time to allow for check-in.  Your nurse visit will consist of discussing your medical and surgical history, your immediate family medical history, and your medications.    Please bring a complete list of all your medications or, if you prefer, bring the medication bottles and we will list them.  We will need to be aware of both prescribed and over the counter drugs.  We will need to know exact dosage information as well.  If you are on blood thinners (Coumadin, Plavix, Aggrenox, Ticlid, etc.) please call our office today/prior to your appointment, as we need to consult with your physician about holding your medication.   Please be prepared to read and sign documents such as consent forms, a financial agreement, and acknowledgement forms.  If necessary, and with your consent, a friend or relative is welcome to sit-in on the nurse visit with you.  Please bring your insurance card so that we may make a copy of it.  If your insurance requires a referral to see a specialist, please bring your referral form from your primary care physician.  No co-pay is required for this nurse visit.     If you cannot keep your appointment, please call [phone] to cancel or reschedule prior to your appointment date.  This allows [place] the opportunity to schedule an appointment for another patient in need of care.    Thank you for choosing [facility] for your medical needs.  We  appreciate the opportunity to care for you.  Please visit [place] at our website  to learn more about our practice.                     Sincerely.                                                                                                                   The Gastroenterology Division

## 2010-03-15 NOTE — Letter (Signed)
Summary: Primary Care Consult Scheduled Letter  [facility] [place]  [address] [place], [place] [address]   Phone: [phone]  Fax: [phone]      09/29/2009 MRN: [id number]  Kevin Farley [address] [place], [place]  [address]    Dear [name],    We have scheduled an appointment for you.  At the recommendation of Dr. Marga Melnick, we have scheduled you a consult with Dr. Rob Bunting of [facility] on 11-21-2009 at 9:15am.  Their address is [address], 3rd floor, [place] [address]. The office phone number is [phone].  If this appointment day and time is not convenient for you, please feel free to call the office of the doctor you are being referred to at the number listed above and reschedule the appointment.    It is important for you to keep your scheduled appointments. We are here to make sure you are given good patient care.   Thank you,    Renee, Patient Care Coordinator [facility] at [facility]

## 2010-03-15 NOTE — Assessment & Plan Note (Signed)
Summary: CONGESTION//KN   Vital Signs:  Patient profile:   55 year old male Weight:      140.13 pounds Temp:     97.7 degrees F oral Pulse rate:   80 / minute Pulse rhythm:   regular BP sitting:   126 / 82  (left arm) Cuff size:   regular  Vitals Entered By: Army Fossa CMA (November 16, 2009 9:04 AM) CC: Pt here for chest congestion.  Comments Coughing Worse at night x 1 week [facility] [address]   History of Present Illness: one-week history of sore throat, cough, chest congestion, some white sputum. Taking NyQuil at bedtime  ROS Denies fevers No nausea vomiting No myalgias History of a corneal transplant, not taking immunosuppressants per patient  Current Medications (verified): 1)  Prilosec 40 Mg Cpdr (Omeprazole) .Marland Kitchen.. 1 By Mouth  Two Times A Day Pre Meals 2)  Multivitamins   Tabs (Multiple Vitamin) .... One Tablet By Mouth Once Daily  Allergies: 1)  ! * Pecan 2)  ! Vytorin 3)  Asa  Past History:  Past Medical History: Reviewed history from 10/18/2009 and no changes required.  HYPERLIPIDEMIA (ICD-272.4): Framingham LDL goal = < 130. NMR Lipoprofile 11/2008: LDL 202(3110/1995), TG 385,HDL 50. ALLERGIC RHINITIS (ICD-477.9) Supraspinatus tear; Cervical Stenosis with radicular pain, Dr Farris Has Colonic polyps, Adenomatous ,hx of 03/2009, Dr Christella Hartigan  GERD  Past Surgical History: Reviewed history from 10/18/2009 and no changes required. Cornea transplant OS back surgery  for fragment  @  L4-5, Dr Trey Sailors, NS nasal bone  surgery, Dr Lazarus Salines 2005 negative cardiac  catherization, done for shoulder pain and elevated lipids Colon polypectomy 03/2009 , Dr Christella Hartigan ,repeat due 2014   Social History: Reviewed history from 10/18/2009 and no changes required. Never Smoked regular diet Occupation:Manager of  hotels/ motels Married Alcohol use-yes, two glasses of wine with dinner every night  Regular exercise-yes: 2X/week  coffee (1 a day)   Physical  Exam  General:  alert and well-developed.   Head:  face symmetric Ears:  R ear normal and L ear normal.   Nose:  slightly congested Mouth:  no redness or discharge Lungs:  normal respiratory effort, no intercostal retractions, no accessory muscle use, and normal breath sounds.   Heart:  normal rate, regular rhythm, and no murmur.      Impression & Recommendations:  Problem # 1:  URI (ICD-465.9) URI symptoms, now with chest congestion and some sputum. See instructions  Complete Medication List: 1)  Prilosec 40 Mg Cpdr (Omeprazole) .Marland Kitchen.. 1 by mouth  two times a day pre meals 2)  Multivitamins Tabs (Multiple vitamin) .... One tablet by mouth once daily 3)  Amoxicillin 500 Mg Tabs (Amoxicillin) .... 2 by mouth twice a day for one week  Patient Instructions: 1)  rest, fluids, Tylenol 2)  Robitussin-DM 2 teaspoons 4 times a day as needed 3)  amoxicillin, for one week. 4)  Call if not better in few days Prescriptions: AMOXICILLIN 500 MG TABS (AMOXICILLIN) 2 by mouth twice a day for one week  #28 x 0   Entered and Authorized by:   Nolon Rod. Paz MD   Signed by:   Nolon Rod. Paz MD on 11/16/2009   Method used:   [facility]        [facility] [phone][facility] (retail)       [address]       [place], [place]  [address]  Ph: [phone]       Fax: [phone]   RxID:   [id number] PRILOSEC 40 MG CPDR (OMEPRAZOLE) 1 by mouth  two times a day pre meals  #60 x 3   Entered and Authorized by:   Nolon Rod. Paz MD   Signed by:   Nolon Rod. Paz MD on 11/16/2009   Method used:   [facility]ically to        [facility] [phone]* (retail)       [address]       [place], [place]  [address]       Ph: [phone]       Fax: [phone]   RxID:   [id number]

## 2010-03-15 NOTE — Progress Notes (Signed)
Summary: cough not better  Phone Note Call from Patient Call back at Work Phone [phone]   Caller: Spouse Summary of Call: patient was seen [phone] he finished antibiotics - finished cough med but he is still coughing - wants rx for cough med  [facility] [address] Initial call taken by: Okey Regal Spring,  November 23, 2009 4:29 PM  Follow-up for Phone Call        Please advise. Follow-up by: Lucious Groves CMA,  November 23, 2009 4:45 PM  Additional Follow-up for Phone Call Additional follow up Details #1::        I reviewed his chart. Unfortunately because of ASA allergy(hives)  he can not take any of the usual cough meds. We can call in short course of Prednisone 20 mg 1/2 three times a day #9 & he can use Mucinex DM Additional Follow-up by: Marga Melnick MD,  November 24, 2009 5:30 AM    Additional Follow-up for Phone Call Additional follow up Details #2::    Patient and spouse notified. Lucious Groves CMA  November 24, 2009 8:31 AM   New/Updated Medications: PREDNISONE 20 MG TABS (PREDNISONE) 0.5 tab by mouth three times a day until finished Prescriptions: PREDNISONE 20 MG TABS (PREDNISONE) 0.5 tab by mouth three times a day until finished  #9 x 0   Entered by:   Lucious Groves CMA   Authorized by:   Marga Melnick MD   Signed by:   Lucious Groves CMA on 11/24/2009   Method used:   Printed then faxed to ...       [facility] [phone]* (retail)       [address]       [place], [place]  [address]       Ph: [phone]       Fax: [phone]   RxID:   [id number]

## 2010-03-15 NOTE — Letter (Signed)
Summary: New Patient letter  [facility]  [address] [place], [place] [address]   Phone: [phone]  Fax: [phone]       09/29/2009 MRN: [id number]  Kevin Farley [address] [place], [place]  [address]  Dear Mr. Yaacov,  Welcome to the Gastroenterology Division at [facility].    You are scheduled to see Dr.  Christella Hartigan on 11-21-09 at  9:15am on the 3rd floor at [facility], [address].  We ask that you try to arrive at our office 15 minutes prior to your appointment time to allow for check-in.  We would like you to complete the enclosed self-administered evaluation form prior to your visit and bring it with you on the day of your appointment.  We will review it with you.  Also, please bring a complete list of all your medications or, if you prefer, bring the medication bottles and we will list them.  Please bring your insurance card so that we may make a copy of it.  If your insurance requires a referral to see a specialist, please bring your referral form from your primary care physician.  Co-payments are due at the time of your visit and may be paid by cash, check or credit card.     Your office visit will consist of a consult with your physician (includes a physical exam), any laboratory testing he/she may order, scheduling of any necessary diagnostic testing (e.g. x-ray, ultrasound, CT-scan), and scheduling of a procedure (e.g. Endoscopy, Colonoscopy) if required.  Please allow enough time on your schedule to allow for any/all of these possibilities.    If you cannot keep your appointment, please call [phone] to cancel or reschedule prior to your appointment date.  This allows [place] the opportunity to schedule an appointment for another patient in need of care.  If you do not cancel or reschedule by 5 p.m. the business day prior to your appointment date, you will be charged a $50.00 late cancellation/no-show fee.    Thank you for choosing [facility] for your medical needs.  We appreciate the opportunity to care for you.  Please visit [place] at our website  to learn more about our practice.                     Sincerely,                                                             The Gastroenterology Division

## 2010-03-15 NOTE — Assessment & Plan Note (Signed)
Summary: ACU ONLY-SORE THROAT BLEEDING AND FEVER//PH   Vital Signs:  Patient Profile:   55 Years Old Male Weight:      139 pounds Temp:     98.3 degrees F oral Pulse rate:   80 / minute Resp:     16 per minute BP sitting:   128 / 80  (right arm)  Pt. in pain?   no  Vitals Entered By: Ardyth Man (January 21, 2007 1:03 PM)                  Chief Complaint:  sore throat and body aches and URI symptoms.  History of Present Illness:  URI Symptoms; ST for 48 hrs; Rx: Theraflu, hydrocodone, herbal prep      This is a 55 year old man who presents with URI symptoms.  The patient reports sore throat, but denies nasal congestion, clear nasal discharge, purulent nasal discharge, dry cough, productive cough, earache, and sick contacts.  Associated symptoms include fever, use of an antipyretic, and response to antipyretic.  The patient denies low-grade fever (<100.5 degrees), stiff neck, dyspnea, wheezing, rash, vomiting, and diarrhea.  The patient also reports muscle aches and severe fatigue.  The patient denies itchy watery eyes, itchy throat, sneezing, seasonal symptoms, response to antihistamine, and headache.  The patient denies the following risk factors for Strep sinusitis: unilateral facial pain, unilateral nasal discharge, poor response to decongestant, double sickening, tooth pain, Strep exposure, and tender adenopathy.  He wants to check lipids; off statin  Current Allergies (reviewed today): ! * PECAN ASA      Physical Exam  General:     Well-developed,well-nourished,in no acute distress; alert,appropriate and cooperative throughout examination Eyes:     No corneal or conjunctival inflammation noted. EOMI. Perrla. Vision severely reduced OS despite corneal transplant for ulcer  Ears:     TMs scarred Nose:     Dry Mouth:     Marked erythema & mild edema Lungs:     Normal respiratory effort, chest expands symmetrically. Lungs are clear to auscultation, no crackles  or wheezes. Heart:     Normal rate and regular rhythm. S1 and S2 normal without gallop, murmur, click, rub or other extra sounds. Cervical Nodes:     Shotty LA on L Axillary Nodes:     No palpable lymphadenopathy    Impression & Recommendations:  Problem # 1:  PHARYNGITIS-ACUTE (ICD-462)  The following medications were removed from the medication list:    Amoxicillin 500 Mg Caps (Amoxicillin) .Marland Kitchen... 1 three times a day  His updated medication list for this problem includes:    Azithromycin 250 Mg Tabs (Azithromycin) .Marland Kitchen... As per package Beta strep neg Orders: Rapid Strep (40981)   Problem # 2:  HYPERCHOLESTEROLEMIA (ICD-272.0)  Orders: TLB-Lipid Panel (80061-LIPID)   Complete Medication List: 1)  Azithromycin 250 Mg Tabs (Azithromycin) .... As per package   Patient Instructions: 1)  Drink as much fluid as you can tolerate for the next few days. Zinc lozenges as needed     Prescriptions: AZITHROMYCIN 250 MG  TABS (AZITHROMYCIN) as per package  #1 pack x 0   Entered and Authorized by:   Marga Melnick MD   Signed by:   Marga Melnick MD on 01/21/2007   Method used:   Print then Give to Patient   RxID:   [id number]  ] Laboratory Results  Date/Time Received: 01/21/2003 12:15pm Date/Time Reported: 01/21/2007 12:15pm  Other Tests  Rapid Strep: negative

## 2010-03-15 NOTE — Assessment & Plan Note (Signed)
Summary: coughing up flim/kdc 12:30pm   Vital Signs:  Patient profile:   55 year old male Height:      66 inches Weight:      142.4 pounds BMI:     23.07 Temp:     98 degrees F oral BP sitting:   120 / 84  Vitals Entered By: Shary Decamp (May 01, 2009 12:51 PM) CC: cough x 1 1/2 week, no relief with OTC med   History of Present Illness: 10 days ago developed flulike symptoms including aches and fever overall feels better except for the cough  Current Medications (verified): 1)  Tramadol Hcl 50 Mg  Tabs (Tramadol Hcl) .Marland Kitchen.. 1 Q 6-8 Hrs As Needed Neck/ Arm  Pain 2)  Flonase 50 Mcg/act Susp (Fluticasone Propionate) .... 2 Sprays Each Nostril Once Daily Prn 3)  Lorazepam 0.5 Mg Tabs (Lorazepam) .Marland Kitchen.. 1 At Bedtime As Needed 4)  Crestor 10 Mg Tabs (Rosuvastatin Calcium) .Marland Kitchen.. 1 By Mouth Once Daily  Allergies (verified): 1)  ! * Pecan 2)  ! Vytorin 3)  Asa  Past History:  Past Medical History: Reviewed history from 12/25/2007 and no changes required. OTHER AND UNSPECIFIED HYPERLIPIDEMIA (ICD-272.4) HYPERCHOLESTEROLEMIA (ICD-272.0) ALLERGIC RHINITIS (ICD-477.9) Supraspinatus tear; Cervical Stenosis   Past Surgical History: Reviewed history from 05/25/2007 and no changes required. cornea transplant back surgery fragment  L4-5, Dr Channing Mutters, NS nose surgery 2005 negative catherization done for shoulder pain and elevated lipids  Social History: Reviewed history from 05/25/2007 and no changes required. Never Smoked regular diet  Review of Systems       denies nausea, vomiting, diarrhea he is bringing up green sputum no shortness of breath  Physical Exam  General:  alert and well-developed.   Head:  face symmetric, not tender to palpation Ears:  R ear normal and L ear normal.   Mouth:  no redness or discharge Lungs:  normal respiratory effort, no intercostal retractions, no accessory muscle use, and normal breath sounds.   Heart:  normal rate, regular rhythm, and no  murmur.     Impression & Recommendations:  Problem # 1:  BRONCHITIS- ACUTE (ICD-466.0) see instructions  His updated medication list for this problem includes:    Zithromax Z-pak 250 Mg Tabs (Azithromycin) .Marland Kitchen... As directed  Complete Medication List: 1)  Tramadol Hcl 50 Mg Tabs (Tramadol hcl) .Marland Kitchen.. 1 q 6-8 hrs as needed neck/ arm  pain 2)  Flonase 50 Mcg/act Susp (Fluticasone propionate) .... 2 sprays each nostril once daily prn 3)  Lorazepam 0.5 Mg Tabs (Lorazepam) .Marland Kitchen.. 1 at bedtime as needed 4)  Crestor 10 Mg Tabs (Rosuvastatin calcium) .Marland Kitchen.. 1 by mouth once daily 5)  Zithromax Z-pak 250 Mg Tabs (Azithromycin) .... As directed  Patient Instructions: 1)  Get plenty of rest, drink lots of clear liquids, and use Tylenol  2)  mucinex DM OTC two times a day until cough better 3)  zpack as prescribed 4)  call if no better in few days  Prescriptions: ZITHROMAX Z-PAK 250 MG TABS (AZITHROMYCIN) as directed  #1 x 0   Entered and Authorized by:   Nolon Rod.  MD   Signed by:   Nolon Rod.  MD on 05/01/2009   Method used:   Electronically to        [facility] [phone]* (retail)       [address]       [place], [place]  [address]  Ph: [phone]       Fax: [phone]   RxID:   [id number]

## 2010-03-15 NOTE — Letter (Signed)
Summary: Results Follow up Letter  [facility]  [address] [place], [place] [address]   Phone: [phone]  Fax: [phone]    09/13/2008 MRN: [id number]  Kevin Farley [address] [place], [place]  [address]  Dear Mr. Hager,  The following are the results of your recent test(s):  Test         Result    Pap Smear:        Normal _____  Not Normal _____ Comments: ______________________________________________________ Cholesterol: LDL(Bad cholesterol):         Your goal is less than:         HDL (Good cholesterol):       Your goal is more than: Comments:  ______________________________________________________ Mammogram:        Normal _____  Not Normal _____ Comments:  ___________________________________________________________________ Hemoccult:        Normal _____  Not normal _______ Comments:    _____________________________________________________________________ Other Tests: PLEASE SEE ATTACHED LABS DONE ON 09/07/2008. IF YOU WOULD LIKE TO CONSIDER REFERRAL PLEASE CALL    We routinely do not discuss normal results over the telephone.  If you desire a copy of the results, or you have any questions about this information we can discuss them at your next office visit.   Sincerely,

## 2010-03-15 NOTE — Assessment & Plan Note (Signed)
Summary: pain in both shoulders/kdc   Vital Signs:  Patient profile:   55 year old male Weight:      141.2 pounds Pulse rate:   66 / minute Resp:     14 per minute BP sitting:   104 / 74  (left arm)  Vitals Entered By: Doristine Devoid (December 07, 2008 3:05 PM) CC: pain in both shoulders also back of neck having some numbness in fingers   CC:  pain in both shoulders also back of neck having some numbness in fingers.  History of Present Illness: NMR reviewed & risk discussed . Risk is > 30 % @ present long term. LDL goal is @ least < 100. He stopped statin due to muscle pain. Crestor affected his sleep. He has been off statin for 6 months.FH + for DM & premature CAD.  No diet; CVE 1X /week.Pain in shoulder girdle several months , initailly on R , now bilateral . PMH of vitamin D deficiency ; he D/Ced vitamin D due to muscle pain. he cancelled colonoscopy due to myalgias.  Allergies: 1)  ! * Pecan 2)  Asa  Review of Systems General:  Denies chills, fever, sweats, and weight loss. CV:  Denies chest pain or discomfort and leg cramps with exertion. MS:  Complains of muscle aches; Slight weakness. Derm:  Denies lesion(s) and rash. Neuro:  Complains of numbness and tingling; N&T L index finger X 1 month.  Physical Exam  General:  well-nourished,in no acute distress; alert,appropriate and cooperative throughout examination Lungs:  Normal respiratory effort, chest expands symmetrically. Lungs are clear to auscultation, no crackles or wheezes. Heart:  normal rate, regular rhythm, no gallop, no rub, no JVD, and grade 1 /6 systolic murmur.   Abdomen:  Bowel sounds positive,abdomen soft and non-tender without masses, organomegaly or hernias noted. Pulses:  R and L carotid,radial,dorsalis pedis and posterior tibial pulses are full and equal bilaterally Extremities:  No clubbing, cyanosis, edema, or deformity noted with normal full range of motion of all joints.   Minor crepitus of  knees Neurologic:  alert & oriented X3 and DTRs symmetrical and normal.   Skin:  Intact without suspicious lesions or rashes Cervical Nodes:  No lymphadenopathy noted Axillary Nodes:  No palpable lymphadenopathy Psych:  memory intact for recent and remote, normally interactive, and good eye contact.     Impression & Recommendations:  Problem # 1:  UNSPECIFIED MYALGIA AND MYOSITIS (ICD-729.1)  His updated medication list for this problem includes:    Tramadol Hcl 50 Mg Tabs (Tramadol hcl) .Marland Kitchen... 1 q 6-8 hrs as needed neck/ arm  pain  Orders: Venipuncture (16109) TLB-CK Total Only(Creatine Kinase/CPK) (82550-CK) TLB-Sedimentation Rate (ESR) (85652-ESR) T-Vitamin D (25-Hydroxy) (60454-09811)  Problem # 2:  VITAMIN D DEFICIENCY (ICD-268.9)  vitamin D stopped  Orders: Venipuncture (91478) T-Vitamin D (25-Hydroxy) (29562-13086)  Problem # 3:  HYPERLIPIDEMIA (ICD-272.4)  Orders: Venipuncture (57846)  His updated medication list for this problem includes:    Vytorin 10-20 Mg Tabs (Ezetimibe-simvastatin) .Marland Kitchen... 1 at bedtime  Problem # 4:  FAMILY HISTORY OF DIABETES MELLITUS (ICD-V18.0)  M,F, 2 bro, 2 sisters  Orders: Venipuncture (96295) TLB-A1C / Hgb A1C (Glycohemoglobin) (83036-A1C)  Complete Medication List: 1)  Tramadol Hcl 50 Mg Tabs (Tramadol hcl) .Marland Kitchen.. 1 q 6-8 hrs as needed neck/ arm  pain 2)  Vitamin D3 50000 Unit Caps (cholecalciferol)  .Marland KitchenMarland KitchenMarland Kitchen 1 weekly 3)  Flonase 50 Mcg/act Susp (Fluticasone propionate) .... 2 sprays each nostril once daily 4)  Vytorin 10-20  Mg Tabs (Ezetimibe-simvastatin) .Marland Kitchen.. 1 at bedtime  Patient Instructions: 1)  Please schedule a follow-up appointment in 3 months. 2)  Hepatic Panel, CPK prior to visit, ICD-9:995.20 3)  Lipid Panel prior to visit, ICD-9:272.4. Consume < 40 grams of sugar / day (< 10 tsp /day). Co Enzyme Q 10 for muscle pain . Prescriptions: VYTORIN 10-20 MG TABS (EZETIMIBE-SIMVASTATIN) 1 at bedtime  #90 x 0   Entered and  Authorized by:   Marga Melnick MD   Signed by:   Marga Melnick MD on 12/07/2008   Method used:   Print then Give to Patient   RxID:   [id number]

## 2010-03-15 NOTE — Letter (Signed)
Summary: Results Letter  [facility]  [address] [place], [place] [address]   Phone: [phone]  Fax: [phone]        March 17, 2009 MRN: [id number]    Kevin Farley [address] [place], [place]  [address]    Dear [name],   The polyp(s) removed during your recent procedure were proven to be adenomatous.  These are pre-cancerous polyps that may have grown into cancers if they had not been removed.  Based on current nationally recognized surveillance guidelines, I recommend that you have a repeat colonoscopy in 3 years.   We will therefore put your information in our reminder system and will contact you in 3 years to schedule a repeat procedure.  Please call if you have any questions or concerns.       Sincerely,  Rachael Fee MD  This letter has been electronically signed by your physician.  Appended Document: Results Letter Letter mailed 2.8.11

## 2010-03-15 NOTE — Progress Notes (Signed)
Summary: **Lab Results**  Phone Note Outgoing Call Call back at [facility] Phone [phone]   Call placed by: Shonna Chock,  Jul 08, 2008 2:35 PM Call placed to: Patient Summary of Call: Left message on machine for patient to return call when avaliable, Reason for call:  Two liver enzymes elevated. Please avoid excess Tylenol & vitamin A. Stop Vytorin  also & recheck fasting liver panel in 5 days (790.4). We'll address the very high LDL after liver issues evaluated. Other labs are very good.  Severely low vitamin D (MINIMUM goal = > 35) ; please take 2000 International Units vitamin D daily & recheck vitamin D level in 3 months.  Chrae Malloy  Jul 08, 2008 2:35 PM   Follow-up for Phone Call        D/W PATIENT'S WIFE, ALL INSTRUCTION OK'D. SCHEDUED APPOINTMENT TO RECHECK LABS ON MONDAY 07/18/2008 Follow-up by: Shonna Chock,  July 12, 2008 8:48 AM

## 2010-03-16 NOTE — Miscellaneous (Signed)
Summary: PT Initial Summary/[facility]  PT Initial Summary/[facility]   Imported By: Lanelle Bal 06/26/2009 09:17:14  _____________________________________________________________________  External Attachment:    Type:   Image     Comment:   External Document

## 2010-04-18 ENCOUNTER — Other Ambulatory Visit: Payer: Self-pay | Admitting: Internal Medicine

## 2010-04-18 ENCOUNTER — Ambulatory Visit (INDEPENDENT_AMBULATORY_CARE_PROVIDER_SITE_OTHER): Payer: BC Managed Care – PPO | Admitting: Internal Medicine

## 2010-04-18 ENCOUNTER — Encounter: Payer: Self-pay | Admitting: Internal Medicine

## 2010-04-18 ENCOUNTER — Ambulatory Visit (INDEPENDENT_AMBULATORY_CARE_PROVIDER_SITE_OTHER)
Admission: RE | Admit: 2010-04-18 | Discharge: 2010-04-18 | Disposition: A | Discharge status: Home / Self Care | Payer: BC Managed Care – PPO | Source: Ambulatory Visit | Attending: Internal Medicine | Admitting: Internal Medicine

## 2010-04-18 DIAGNOSIS — IMO0001 Reserved for inherently not codable concepts without codable children: Secondary | ICD-10-CM

## 2010-04-18 DIAGNOSIS — R5383 Other fatigue: Secondary | ICD-10-CM

## 2010-04-18 DIAGNOSIS — J984 Other disorders of lung: Secondary | ICD-10-CM

## 2010-04-18 DIAGNOSIS — E559 Vitamin D deficiency, unspecified: Secondary | ICD-10-CM

## 2010-04-18 DIAGNOSIS — M2550 Pain in unspecified joint: Secondary | ICD-10-CM

## 2010-04-18 DIAGNOSIS — R059 Cough, unspecified: Secondary | ICD-10-CM

## 2010-04-18 DIAGNOSIS — R5381 Other malaise: Secondary | ICD-10-CM

## 2010-04-18 DIAGNOSIS — R05 Cough: Secondary | ICD-10-CM

## 2010-04-18 LAB — CBC WITH DIFFERENTIAL/PLATELET
Basophils Absolute: 0 10*3/uL (ref 0.0–0.1)
Basophils Relative: 0.5 % (ref 0.0–3.0)
Eosinophils Absolute: 0 10*3/uL (ref 0.0–0.7)
Eosinophils Relative: 0.8 % (ref 0.0–5.0)
HCT: 40 % (ref 39.0–52.0)
Hemoglobin: 13.6 g/dL (ref 13.0–17.0)
Lymphocytes Relative: 31.5 % (ref 12.0–46.0)
Lymphs Abs: 1.2 10*3/uL (ref 0.7–4.0)
MCHC: 34 g/dL (ref 30.0–36.0)
MCV: 84.1 fl (ref 78.0–100.0)
Monocytes Absolute: 0.4 10*3/uL (ref 0.1–1.0)
Monocytes Relative: 9.5 % (ref 3.0–12.0)
Neutro Abs: 2.2 10*3/uL (ref 1.4–7.7)
Neutrophils Relative %: 57.7 % (ref 43.0–77.0)
Platelets: 180 10*3/uL (ref 150.0–400.0)
RBC: 4.75 Mil/uL (ref 4.22–5.81)
RDW: 13.6 % (ref 11.5–14.6)
WBC: 3.8 10*3/uL — ABNORMAL LOW (ref 4.5–10.5)

## 2010-04-18 LAB — CK: Total CK: 139 U/L (ref 7–232)

## 2010-04-18 LAB — SEDIMENTATION RATE: Sed Rate: 10 mm/hr (ref 0–22)

## 2010-04-18 LAB — TSH: TSH: 2.15 u[IU]/mL (ref 0.35–5.50)

## 2010-04-19 LAB — CONVERTED CEMR LAB: Vit D, 25-Hydroxy: 29 ng/mL — ABNORMAL LOW (ref 30–89)

## 2010-04-24 NOTE — Assessment & Plan Note (Signed)
Summary: still having cold symptoms in chest, not prod cough, just hea...   Vital Signs:  Patient profile:   55 year old male Weight:      144.2 pounds BMI:     23.01 Temp:     98.1 degrees F oral Pulse rate:   72 / minute Resp:     14 per minute BP sitting:   118 / 80  (left arm) Cuff size:   regular  Vitals Entered By: Shonna Chock CMA (April 18, 2010 9:36 AM) CC: Dry cough and congestion, pain-rib area(x 3 months). Patient also c/o shoulder and elbow pain which is related to chlosterol med. When patient was off meds symptoms resolved., Fatigue, Heartburn   Primary Care Provider:  Marga Melnick, MD   CC:  Dry cough and congestion, pain-rib area(x 3 months). Patient also c/o shoulder and elbow pain which is related to chlosterol med. When patient was off meds symptoms resolved., Fatigue, and Heartburn.  History of Present Illness:      This is a 55 year old male who presents with Chest pain for 3 months.  The patient reports nausea and  nocturnal diaphoresis, but denies exertional chest pain, shortness of breath, palpitations, light headedness, and syncope.  The pain is described as intermittent and burning.  The pain is located in the substernal area and left anterior chest.  The pain  does not radiate. Episodes of chest pain last >30 minutes.  The pain is brought on or made worse by lying down.  The pain is relieved or improved with Tylenol.     He  also reports NP  cough for 6 months.  The patient denies fever and hemoptysis.  The patient denies the following symptoms: leg swelling, orthopnea, PND, melena, adenopathy, and severe snoring.       He  reports acid reflux and sour taste in mouth, but denies epigastric pain and trouble swallowing.  The patient denies the following alarm features: dysphagia.  Symptoms are worse with spicy foods.  Prior evaluation has included EGD by Dr Christella Hartigan in 09/ 2011 which revealed gastritis.      Hyperlipidemia Follow-Up: [facility] Panel was done 3  months off statins. Due to  joint &  muscle aches,he takes Lipitor 80 mg 1/2 pill every other day. This was Rxed by Dr Sharyn Lull.He  denies constipation and diarrhea.  The patient reports exercising occasionally, 1 X /week because of joint / muscle symptoms & fatigue.    Current Medications (verified): 1)  Multivitamins   Tabs (Multiple Vitamin) .... One Tablet By Mouth Once Daily 2)  Fluticasone Propionate 50 Mcg/act Susp (Fluticasone Propionate) .... Once Daily 3)  Lipitor 80 Mg Tabs (Atorvastatin Calcium) .... 0.5 Tab By Mouth Once Daily 4)  Fish Oil 1000 Mg Caps (Omega-3 Fatty Acids) .Marland Kitchen.. 1 By Mouth Once Daily 5)  Omeprazole 40 Mg Cpdr (Omeprazole) .Marland Kitchen.. 1 By Mouth Once Daily (Am)  Allergies: 1)  ! * Pecan 2)  ! Vytorin 3)  Asa  Physical Exam  General:  Thin ,in no acute distress; alert,appropriate and cooperative throughout examination Eyes:  No corneal or conjunctival inflammation noted. No icterus Ears:  External ear exam shows no significant lesions or deformities.  Otoscopic examination reveals clear canals, tympanic membranes are intact bilaterally without bulging, retraction, inflammation or discharge. Hearing is grossly normal bilaterally. Nose:  External nasal examination shows no deformity or inflammation. Nasal mucosa are pink and moist without lesions or exudates. Mouth:  Oral mucosa and oropharynx  without lesions or exudates.  Teeth in good repair. No pharyngeal erythema.   Chest Wall:  No deformities, masses, tenderness or gynecomastia noted. Lungs:  Normal respiratory effort, chest expands symmetrically. Lungs are clear to auscultation, no crackles or wheezes. Heart:  Normal rate and regular rhythm. S1 and S2 normal without gallop, murmur, click, rub or other extra sounds. Abdomen:  Bowel sounds positive,abdomen soft and non-tender without masses, organomegaly or hernias noted. Skin:  Intact without suspicious lesions or rashes Cervical Nodes:  No lymphadenopathy  noted Axillary Nodes:  No palpable lymphadenopathy Psych:  memory intact for recent and remote, normally interactive, and good eye contact.     Impression & Recommendations:  Problem # 1:  COUGH (ICD-786.2) ? related to ERD Orders: T-2 View CXR (71020TC) Venipuncture (16109)  Problem # 2:  MUSCLE PAIN (ICD-729.1)  Orders: Venipuncture (60454) TLB-CK Total Only(Creatine Kinase/CPK) (82550-CK)  Problem # 3:  ARTHRALGIA (ICD-719.40)  Orders: Specimen Handling (09811) TLB-Sedimentation Rate (ESR) (85652-ESR)  Problem # 4:  PULMONARY NODULE, LEFT UPPER LOBE (ICD-518.89)  Orders: T-2 View CXR (71020TC)  Problem # 5:  HYPERLIPIDEMIA (ICD-272.4)  His updated medication list for this problem includes:    Lipitor 80 Mg Tabs (Atorvastatin calcium) .Marland Kitchen... 0.5 tab by mouth once daily as per Dr Sharyn Lull ( actual dose 1/2 every other day )  Problem # 6:  FATIGUE (ICD-780.79)  Orders: Venipuncture (91478) Specimen Handling (29562) TLB-CBC Platelet - w/Differential (85025-CBCD) TLB-TSH (Thyroid Stimulating Hormone) (84443-TSH)  Problem # 7:  VITAMIN D DEFICIENCY (ICD-268.9)  Orders: T-Vitamin D (25-Hydroxy) (13086-57846) Specimen Handling (96295) TLB-CK Total Only(Creatine Kinase/CPK) (82550-CK)  Complete Medication List: 1)  Multivitamins Tabs (Multiple vitamin) .... One tablet by mouth once daily 2)  Fluticasone Propionate 50 Mcg/act Susp (Fluticasone propionate) .... Once daily 3)  Lipitor 80 Mg Tabs (Atorvastatin calcium) .... 0.5 tab by mouth once daily 4)  Fish Oil 1000 Mg Caps (Omega-3 fatty acids) .Marland Kitchen.. 1 by mouth once daily 5)  Omeprazole 40 Mg Cpdr (Omeprazole) .Marland Kitchen.. 1 by mouth once daily (am)  Patient Instructions: 1)  See Dr Sharyn Lull to discuss the [facility] Panel. PFTs may be necessary to evaluate the cough 2)  Avoid foods high in acid (tomatoes, citrus juices, spicy foods). Avoid eating within two hours of lying down or before exercising. Do not over eat; try  smaller more frequent meals. Elevate head of bed twelve inches when sleeping.   Orders Added: 1)  Est. Patient Level IV [28413] 2)  T-2 View CXR [71020TC] 3)  Venipuncture [36415] 4)  T-Vitamin D (25-Hydroxy) [24401-02725] 5)  Specimen Handling [99000] 6)  TLB-CBC Platelet - w/Differential [85025-CBCD] 7)  TLB-TSH (Thyroid Stimulating Hormone) [84443-TSH] 8)  TLB-CK Total Only(Creatine Kinase/CPK) [82550-CK] 9)  TLB-Sedimentation Rate (ESR) [85652-ESR]

## 2010-05-08 ENCOUNTER — Encounter: Payer: Self-pay | Admitting: Gastroenterology

## 2010-05-08 ENCOUNTER — Ambulatory Visit (INDEPENDENT_AMBULATORY_CARE_PROVIDER_SITE_OTHER): Payer: BC Managed Care – PPO | Admitting: Gastroenterology

## 2010-05-08 VITALS — BP 110/68 | HR 88 | Ht 66.0 in | Wt 144.8 lb

## 2010-05-08 DIAGNOSIS — K219 Gastro-esophageal reflux disease without esophagitis: Secondary | ICD-10-CM

## 2010-05-08 NOTE — Progress Notes (Signed)
Review of pertinent gastrointestinal problems: 1.  Routine risk for colon cancer, colonoscopy February 2011 found several small adenomatous. Recall colonoscopy at 3 year interval. 2.  Gastritis, GERD symptoms , EGD September 2011 , biopsies of gastritis showed no H. Pylori.   Improved on once daily proton pump inhibitor ( March 2012)   HPI: This is a  Very pleasant 55 year old man who is here with his wife today. I last saw on at the time of upper endoscopy , see those results summarized above. He was started on over-the-counter proton pump inhibitor and since then he says he feels definitely better but can still be bothered at times with GERD-like symptoms.  He is a little bit better.  Still sometimes as epigastric pains.  Will have occasional nighttime acid in mouth, coughing.  No dysphagia.  Overall stable wieght.  Takes ppi every morning shortly before breakfast.  Never really takes anything else for his stomach.    Physical Exam: Vital signs from this visit reviewed Constitutional: generally well-appearing Psychiatric: alert and oriented x3 Abdomen: soft, nontender, nondistended, no obvious ascites, no peritoneal signs, normal bowel sounds   Assessment and plan:  GERD-like symptoms, dyspepsia  He will continue once daily proton pump inhibitor in the morning and Will begin taking bedtime H2 blocker. He will call report on his symptoms in 5-6 weeks and sooner if needed.   personal history of adenomatous colon polyps  He is already set up for repeat colonoscopy at 3 year interval

## 2010-05-08 NOTE — Patient Instructions (Signed)
Trial of OTC pepcid or zantac, best to take at bedtime. Continue taking omeprazole every morning. Call Dr. Christella Hartigan' office in 5-6 to report on your symptoms after starting pepcid.

## 2010-06-29 NOTE — Assessment & Plan Note (Signed)
[facility]                          [facility] OFFICE NOTE   Kevin, Farley                            MRN:          [id number]  DATE:10/03/2005                            DOB:          [birth year]    Kevin Farley was seen as a new patient on September 26, 2005.  He has been  followed by Dr. Leodis Sias of the [facility] for 8 or 9 years.   His chief concern is pain in left lower quadrant which also involves the  left testicle.  This occurs 2-3 times per week.  It is described as aching  and dull and lasting less than 60 minutes.  It is worse after exercise.  He  has been evaluated with a CT scans which reveal gallstones and prostatic  hypertrophy.  Dr. Vonita Moss also evaluated him and treated him with  antibiotics.   He has taken at least two weeks of antibiotics for Helicobacter pylori  positive serology.  His GI review of systems includes constipation.   He describes fatigue without other constitutional symptoms.   He did have a staph infection of his hand in April 2007 and the penis in  June of 2007.  Apparently this was methicillin-resistant Staphylococcus  aureus.   He is on no regular cardiovascular exercise program.  He does modify his  diet based on that of his native [place].  Specifically he does not eat a  typical fast-food American-diet.   PAST MEDICAL HISTORY:  1. Includes a cornea transplant in 1992.  2. Back surgery for a fragment, which was pinching a nerve in 2001.  3. He had nasal surgery in 2005.  4. He had shoulder pain in 2005 in the context of increased lipids.      Catheterization was negative.   FAMILY HISTORY:  Positive for diabetes, hypertension, stroke as well as  heart attack.  The family history is quite positive cerebral vascular  disease.   SOCIAL HISTORY:  He has never smoked.  He had decreased exercise for a year,  specifically decreased horseback riding since his back issues.   MEDICATIONS:   He is on MiraLax.   ALLERGIES:  He is intolerant and allergic to ASPIRIN and PECANS.   EXAMINATION:  VITAL SIGNS:  Weight was 137.4.  Pulse 60. Respiratory rate  14.  Blood pressure 120/72.  HEENT:  He had no scleral or ictus or jaundice.  NECK:  Thyroid was normal to palpation.  CARDIOPULMONARY:  Exam unremarkable.  Specifically there was no edema and  all pulses were intact.  He had no organomegaly or masses or  lymphadenopathy.  RECTAL:  Deferred as he has been seen by Dr. Vonita Moss.  NEUROMUSCULAR:  Exam was unremarkable.  SKIN:  Warm and dry without jaundice.   LABORATORY DATA:  Normal/negative studies include CBC and differential,  hepatic profile, hemoglobin A1C as well as free T-4.  He has had extensive  evaluations otherwise.  Although he has a history of a low white count  previously, his white count is 4600, well within normal limits.  He does have gallstones but these are asymptomatic with no right upper  quadrant tenderness.  His pain is in the left lower quadrant extending into  the testicle.   MRI of the lumbosacral spine shows a previous left hemi-laminectomy at L4-  L5.  The thecal sac is decompressed but there is some narrowing of the left  lateral recess with moderate bi-foraminal narrowing.  He also has facet  arthropathy at L5-S1.  There was no obvious explanation for his symptoms on  MRI.  I will send a copy of this report to Dr. Channing Mutters asking whether he feels  additional neuro-surgical evaluation is necessary.   Because of his obstipation and left lower quadrant pain at this time I would  recommend a GI consult.   After his initial visit he was seen on August 17 complaining of some pain in  the neck in the mornings which would last several hours.  He was worse  sitting 2-3 hours.  It would occur 4-5 times a week but was not each  morning.  He has been pain-free with this for up to a week.  This was  treated with BenGay patch.  There is some radiation to the  left shoulder but  the pain is mainly in the midline.   The neuromuscular exam was again normal with normal reflexes and strength.  There was no evidence to suggest cervical radiculopathy.  I did recommend  the use of a special cervical pillow at night with Arthritis Strength  Tylenol at bedtime.  Massage therapy is one option as is chiropractory.   Because of the persistence of his abdominal pain he was placed on Levsin SL  0.125 mg 1-2-every 4-6-hours.   Lyrica 75 mg one every 12-hours was provided for this testicular pain which  may represent a sacral radiculopathy at S3-S4.   Further evaluation at this time will include the gastroenterology  consultation with a gastroenterologist on their managed care plan and follow-  up with Dr. Channing Mutters if he feels this is indicated based on these data.                                   Titus Dubin. Alwyn Ren, MD, FACP, [facility]   [facility]  DD:  10/03/2005  DT:  10/03/2005  Job #:  [id number]   cc:   [name], MD

## 2010-06-29 NOTE — Cardiovascular Report (Signed)
Kevin Farley, Kevin Farley                   ACCOUNT NO.:  [id number]   MEDICAL RECORD NO.:  [id number]          PATIENT TYPE:  OIB   LOCATION:  6501                         FACILITY:  [facility]   PHYSICIAN:  Mohan N. Sharyn Lull, M.D. DATE OF BIRTH:  07-18-1955   DATE OF PROCEDURE:  05/01/2004  DATE OF DISCHARGE:                              CARDIAC CATHETERIZATION   PROCEDURE:  Left cardiac catheterization with selective left and right  coronary angiography, left ventriculography via right groin using Judkins  technique.   INDICATIONS FOR PROCEDURE:  [name] is a 55 year old Asian male with past  medical history significant for non-insulin-dependent diabetes mellitus  controlled by diet, hypercholesteremia, strong family history of coronary  artery disease, who complains of left infraclavicular chest pain described  as pressure and left shoulder pain with occasional associated left arm  numbness, denies any nausea, vomiting, diaphoresis, denies PND, orthopnea or  leg swelling, denies exertional chest pain.  The patient underwent stress  tests x2 in the past which were negative for ischemia.  Due to recurrent  atypical chest pain, left arm occasional numbness and multiple risk factors,  and strong family history of coronary artery disease, discussed with the  patient regarding left cath, its risks and benefits and consented for the  procedure.   PROCEDURE:  After obtaining the informed consent, the patient was brought to  the cath lab and was placed on fluoroscopy table.  The right groin was  prepped and draped in the usual fashion.  Xylocaine 2% was used for local  anesthesia in the right groin.  With the help of a thin-walled needle, a 4-  [place] arterial sheath was placed.  The sheath was aspirated and flushed.  Next, 4-French left Judkins catheter was advanced over the wire under  fluoroscopic guidance up to the ascending aorta.  Wire was pulled out and  the catheter was aspirated and  connected to the manifold.  Catheter was  further advanced and engaged into left coronary ostium.  Multiple views of  the left system were taken.  Next, the catheter was disengaged and was  pulled out over the wire and was replaced with a 4-French right Judkins  catheter, which was advanced over the wire under fluoroscopic guidance up to  the ascending aorta.  Wire was pulled out and the catheter was aspirated and  connected to the manifold.  Catheter was further advanced and engaged into  right coronary ostium.  Multiple views of the right system were taken.  Next, the catheter was disengaged and was pulled out over the wire and was  replaced with 4-French pigtail catheter, which was advanced over the wire  under fluoroscopic guidance up to the ascending aorta.  Wire was pulled out  and the catheter was aspirated and connected to the manifold.  Catheter was  further advanced across aortic valve into the LV.  LV pressures were  recorded.  Next, left ventriculography was done in 30-degree RAO position.  Post-angiographic pressures were recorded from LV and then pullback  pressures were recorded from the aorta.  There was no gradient  across aortic  valve.  Next, the pigtail catheter was pulled out over the wire.  Sheaths  were aspirated and flushed.   FINDINGS:  LV showed good LV systolic function, EF of 50% to 55%.  Left main  was patent.  The LAD has 15% to 20% mid-stenosis and then it tapers down at  the apex.  Diagonal 1 and 2 were patent.  Left circumflex was patent.  OM-1 was small, which was patent.  OM-2 to OM-4 were very small.  RCA was  large, which was patent.  The patient has a right dominant coronary system.  The patient tolerated procedure well.  There are no complications.  The  patient was transferred to recovery room in stable condition.      [facility]/MEDQ  D:  05/01/2004  T:  05/01/2004  Job:  [id number]   cc:   [facility]

## 2010-06-29 NOTE — Assessment & Plan Note (Signed)
Kevin Farley                        [facility] OFFICE NOTE   Kevin Farley, Kevin Farley                            MRN:          [id number]  DATE:05/28/2006                            DOB:          [birth year]    Kevin Farley was seen May 28, 2006 concerned about the recent reports on  Vytorin.  He has had a catheterization which revealed less than 30%  blockage and was told that coronary arteries were normal.  Unfortunately his NMR profile revealed an LDL of 273 with 4313 total  particles and 3095 small dense particles.  At that time triglycerides  were 352 indicating excess carb intake.  His A1c was normal.   With Vytorin 10/40 his LDL dropped to 99 and  triglycerides were 117.  He did have some mild elevation of liver enzymes with an SGOT of 47 and  SGPT  of 101.   He stopped the Vytorin after the reports came out and is questioning his  options.   I explained that the LDL of 273 would be associated with  over 25% risk  of premature cardiovascular disease.  Options would include taking  Vytorin 10/20, Crestor 10, or possibly getting a second opinion from a  lipidologist here in the community  or from Dr. Eligah East at [facility], who is  one of the [employer].   He has no significant murmurs at this time and no carotid bruits.  Blood  pressure is well controlled at 120/80.   I would recommend that he restrict carbs such as the Flat Belly Diet  and continue with cardiovascular exercise program of 30-45 minutes 3-4  times a week.   He and his wife will consider these options and let me know what they  want to pursue. Additinally he was concerned about foot pain; a uric  acid level will be drawn & referral made to a Podiatrist if symptoms  persist. He inquired as to pain meds for chronic back issues for which  he has seen Dr Trey Sailors & for which he received PT.I defer to their  recommendations. pursue.     Titus Dubin. Alwyn Ren, MD,FACP,FCCP  Electronically Signed    [facility]/MedQ  DD: 05/28/2006  DT: 05/28/2006  Job #: [id number]

## 2010-08-06 ENCOUNTER — Other Ambulatory Visit: Payer: Self-pay | Admitting: Cardiology

## 2010-08-06 DIAGNOSIS — R911 Solitary pulmonary nodule: Secondary | ICD-10-CM

## 2010-08-07 ENCOUNTER — Other Ambulatory Visit: Payer: BC Managed Care – PPO

## 2010-08-07 ENCOUNTER — Other Ambulatory Visit (HOSPITAL_COMMUNITY): Payer: Self-pay | Admitting: Cardiology

## 2010-08-07 ENCOUNTER — Ambulatory Visit
Admission: RE | Admit: 2010-08-07 | Discharge: 2010-08-07 | Disposition: A | Discharge status: Home / Self Care | Payer: BC Managed Care – PPO | Source: Ambulatory Visit | Attending: Cardiology | Admitting: Cardiology

## 2010-08-07 DIAGNOSIS — R911 Solitary pulmonary nodule: Secondary | ICD-10-CM

## 2010-08-07 MED ORDER — IOHEXOL 300 MG/ML  SOLN
75.0000 mL | Freq: Once | INTRAMUSCULAR | Status: AC | PRN
  Administered 2010-08-07: 75 mL via INTRAVENOUS

## 2010-08-14 ENCOUNTER — Ambulatory Visit (HOSPITAL_COMMUNITY): Payer: BC Managed Care – PPO

## 2010-08-14 ENCOUNTER — Encounter (HOSPITAL_COMMUNITY)
Admission: RE | Admit: 2010-08-14 | Discharge: 2010-08-14 | Disposition: A | Discharge status: Home / Self Care | Payer: BC Managed Care – PPO | Source: Ambulatory Visit | Attending: Cardiology | Admitting: Cardiology

## 2010-08-14 DIAGNOSIS — R079 Chest pain, unspecified: Secondary | ICD-10-CM | POA: Insufficient documentation

## 2010-08-14 MED ORDER — TECHNETIUM TC 99M TETROFOSMIN IV KIT
10.0000 | PACK | Freq: Once | INTRAVENOUS | Status: AC | PRN
  Administered 2010-08-14: 10 via INTRAVENOUS

## 2010-08-14 MED ORDER — TECHNETIUM TC 99M TETROFOSMIN IV KIT
30.0000 | PACK | Freq: Once | INTRAVENOUS | Status: AC | PRN
  Administered 2010-08-14: 30 via INTRAVENOUS

## 2010-08-20 ENCOUNTER — Emergency Department (INDEPENDENT_AMBULATORY_CARE_PROVIDER_SITE_OTHER): Payer: BC Managed Care – PPO

## 2010-08-20 ENCOUNTER — Encounter (HOSPITAL_BASED_OUTPATIENT_CLINIC_OR_DEPARTMENT_OTHER): Payer: Self-pay | Admitting: *Deleted

## 2010-08-20 ENCOUNTER — Emergency Department (HOSPITAL_BASED_OUTPATIENT_CLINIC_OR_DEPARTMENT_OTHER)
Admission: EM | Admit: 2010-08-20 | Discharge: 2010-08-20 | Disposition: A | Discharge status: Home / Self Care | Payer: BC Managed Care – PPO | Attending: Emergency Medicine | Admitting: Emergency Medicine

## 2010-08-20 ENCOUNTER — Other Ambulatory Visit: Payer: Self-pay

## 2010-08-20 DIAGNOSIS — J984 Other disorders of lung: Secondary | ICD-10-CM

## 2010-08-20 DIAGNOSIS — R079 Chest pain, unspecified: Secondary | ICD-10-CM | POA: Insufficient documentation

## 2010-08-20 DIAGNOSIS — R0789 Other chest pain: Secondary | ICD-10-CM

## 2010-08-20 LAB — COMPREHENSIVE METABOLIC PANEL
ALT: 32 U/L (ref 0–53)
AST: 21 U/L (ref 0–37)
Albumin: 4.2 g/dL (ref 3.5–5.2)
Alkaline Phosphatase: 58 U/L (ref 39–117)
BUN: 13 mg/dL (ref 6–23)
CO2: 26 mEq/L (ref 19–32)
Calcium: 9.6 mg/dL (ref 8.4–10.5)
Chloride: 105 mEq/L (ref 96–112)
Creatinine, Ser: 1 mg/dL (ref 0.50–1.35)
GFR calc Af Amer: >60 mL/min (ref >60)
GFR calc non Af Amer: >60 mL/min (ref >60)
Glucose, Bld: 113 mg/dL — ABNORMAL HIGH (ref 70–99)
Potassium: 3.7 mEq/L (ref 3.5–5.1)
Sodium: 142 mEq/L (ref 135–145)
Total Bilirubin: 0.3 mg/dL (ref 0.3–1.2)
Total Protein: 7.4 g/dL (ref 6.0–8.3)

## 2010-08-20 LAB — DIFFERENTIAL
Basophils Absolute: 0 10*3/uL (ref 0.0–0.1)
Basophils Relative: 0 % (ref 0–1)
Eosinophils Absolute: 0 10*3/uL (ref 0.0–0.7)
Eosinophils Relative: 1 % (ref 0–5)
Lymphocytes Relative: 27 % (ref 12–46)
Lymphs Abs: 1.1 10*3/uL (ref 0.7–4.0)
Monocytes Absolute: 0.4 10*3/uL (ref 0.1–1.0)
Monocytes Relative: 10 % (ref 3–12)
Neutro Abs: 2.5 10*3/uL (ref 1.7–7.7)
Neutrophils Relative %: 62 % (ref 43–77)

## 2010-08-20 LAB — CBC
HCT: 39.3 % (ref 39.0–52.0)
Hemoglobin: 13.6 g/dL (ref 13.0–17.0)
MCH: 27.5 pg (ref 26.0–34.0)
MCHC: 34.6 g/dL (ref 30.0–36.0)
MCV: 79.6 fL (ref 78.0–100.0)
Platelets: 194 10*3/uL (ref 150–400)
RBC: 4.94 MIL/uL (ref 4.22–5.81)
RDW: 13.6 % (ref 11.5–15.5)
WBC: 4 10*3/uL (ref 4.0–10.5)

## 2010-08-20 LAB — LIPASE, BLOOD: Lipase: 32 U/L (ref 11–59)

## 2010-08-20 MED ORDER — ACETAMINOPHEN 500 MG PO TABS
1000.0000 mg | ORAL_TABLET | Freq: Once | ORAL | Status: AC
Start: 2010-08-20 — End: 2010-08-20
  Administered 2010-08-20: 1000 mg via ORAL
  Filled 2010-08-20: qty 2

## 2010-08-20 NOTE — ED Provider Notes (Addendum)
History     Chief Complaint  Patient presents with  . Chest Pain    Left sided chest x 2 months. Abd fullness.    HPI Comments: Pt complains of chest pain for 2 months.  Pt reports he has seen Dr. Sharyn Lull and Dr. Quintella Reichert.  Pt had a stress test and thallium that was negative.  Pt reports pain is worse with sitting and with lying down.   Patient is a 55 y.o. male presenting with chest pain. The history is provided by the patient.  Chest Pain The chest pain began more  than 1 month ago. Duration of episode(s) is 2 months. Chest pain occurs constantly. The chest pain is improving. At its most intense, the pain is at 8/10. The quality of the pain is described as sharp and tightness. Primary symptoms include fatigue. Pertinent negatives for primary symptoms include no syncope, no shortness of breath, no cough and no wheezing. He tried nothing for the symptoms.  Pertinent negatives for past medical history include no CAD.  Procedure history is negative for stress thallium and exercise treadmill test.     Past Medical History  Diagnosis Date  . Hyperlipidemia   . Allergic rhinitis   . Supraspinatus tendon tear   . Colon polyps     adenomatous 03/2009  . GERD (gastroesophageal reflux disease)     Past Surgical History  Procedure Date  . Corneal transplant   . Back surgery   . Nasal bone surgery     Family History  Problem Relation Age of Onset  . Hypertension Mother   . Diabetes Mother   . Stroke Mother   . Diabetes Father   . Hypertension Father   . Diabetes Brother   . Hypertension Brother   . Heart disease Brother   . Stroke Brother   . Diabetes Sister   . Hypertension Sister     History  Substance Use Topics  . Smoking status: Never Smoker   . Smokeless tobacco: Not on file  . Alcohol Use: Yes      Review of Systems  Constitutional: Positive for fatigue.  Respiratory: Negative for cough, shortness of breath and wheezing.   Cardiovascular: Positive for chest  pain. Negative for syncope.    Physical Exam  BP 123/80  Pulse 76  Temp(Src) 98 F (36.7 C) (Oral)  Resp 20  Ht 5\' 6"  (1.676 m)  Wt 142 lb (64.411 kg)  BMI 22.92 kg/m2  SpO2 100%  Physical Exam  Constitutional: He is oriented to person, place, and time. He appears well-developed and well-nourished.  HENT:  Head: Normocephalic.  Eyes: Conjunctivae and EOM are normal. Pupils are equal, round, and reactive to light.  Neck: Normal range of motion. Neck supple.  Cardiovascular: Normal rate and regular rhythm.   Pulmonary/Chest: Effort normal and breath sounds normal.  Abdominal: Soft. Bowel sounds are normal.  Musculoskeletal: Normal range of motion.  Neurological: He is alert and oriented to person, place, and time. He has normal reflexes.  Skin: Skin is warm and dry.  Psychiatric: He has a normal mood and affect.    ED Course  Procedures  MDM Dr. Ethelda Chick in to see pt.  I obtained lipase and chemistry.   Pt advised that he needs to follow up with Dr. Quintella Reichert.  Pt had a ct of his chest 6/26 which was normal.     I participated in all key decisions and personally evaluated the patient. Pt alert, no distress  Doug Sou, MD  08/24/10 1322  Doug Sou, MD 09/12/10 2357

## 2010-08-20 NOTE — ED Provider Notes (Signed)
History     Chief Complaint  Patient presents with  . Chest Pain    Left sided chest x 2 months. Abd fullness.    HPI  Past Medical History  Diagnosis Date  . Hyperlipidemia   . Allergic rhinitis   . Supraspinatus tendon tear   . Colon polyps     adenomatous 03/2009  . GERD (gastroesophageal reflux disease)     Past Surgical History  Procedure Date  . Corneal transplant   . Back surgery   . Nasal bone surgery     Family History  Problem Relation Age of Onset  . Hypertension Mother   . Diabetes Mother   . Stroke Mother   . Diabetes Father   . Hypertension Father   . Diabetes Brother   . Hypertension Brother   . Heart disease Brother   . Stroke Brother   . Diabetes Sister   . Hypertension Sister     History  Substance Use Topics  . Smoking status: Never Smoker   . Smokeless tobacco: Not on file  . Alcohol Use: Yes      Review of Systems  Physical Exam  BP 123/80  Pulse 76  Temp(Src) 98 F (36.7 C) (Oral)  Resp 20  Ht 5\' 6"  (1.676 m)  Wt 142 lb (64.411 kg)  BMI 22.92 kg/m2  SpO2 100%  Physical Exam  ED Course  Procedures  I have personally interviewed and examined the patent and  Provided a key role in all aspects of his care.  Pt  /ol eft parasternal chest pain constant for 2 mobnths worse last nigh pain worse is worsee when sits for ap ronged period of time . Nonexertional no sob on exam alert nad   Lungs cat cioer rrr abs no ntender note cpauin chronic doubt acs pt reports neg steress test aspprox 1 week ago .  Doubt ao dissection or pe , had neg ct chest 08/07/10    Date: 08/20/2010  Rate:75   Rhythm: normal sinus rhythm  QRS Axis: normal  Intervals: normal  ST/T Wave abnormalities: normal  Conduction Disutrbances:none  Narrative Interpretation:   Old EKG Reviewed: none available     Doug Sou, MD 08/20/10 1454

## 2010-08-21 ENCOUNTER — Other Ambulatory Visit (HOSPITAL_COMMUNITY): Payer: BC Managed Care – PPO

## 2010-08-27 ENCOUNTER — Encounter: Payer: Self-pay | Admitting: Internal Medicine

## 2010-09-03 ENCOUNTER — Telehealth: Payer: Self-pay | Admitting: Gastroenterology

## 2010-09-04 NOTE — Telephone Encounter (Signed)
Left message on machine to call back  

## 2010-09-05 NOTE — Telephone Encounter (Signed)
Pt is continuing to have acid reflux at night and a bad breath odor, he is taking his prilosec bid and zantac at bedtime.  He is still eating very spicy fried foods as well as caffeine and chocolate.  I advised the pt to avoid these foods elevate the head of his bed and I have sent him some info on GERD and thing to avoid and try.  Appt scheduled for 10/05/10 10 am

## 2010-09-10 ENCOUNTER — Other Ambulatory Visit: Payer: Self-pay | Admitting: Internal Medicine

## 2010-09-11 MED ORDER — OMEPRAZOLE 40 MG PO CPDR: 40.0000 mg | DELAYED_RELEASE_CAPSULE | Freq: Two times a day (BID) | ORAL | Status: DC

## 2010-09-11 NOTE — Telephone Encounter (Signed)
RX sent to pharmacy  

## 2010-10-05 ENCOUNTER — Ambulatory Visit (INDEPENDENT_AMBULATORY_CARE_PROVIDER_SITE_OTHER): Payer: BC Managed Care – PPO | Admitting: Gastroenterology

## 2010-10-05 ENCOUNTER — Encounter: Payer: Self-pay | Admitting: Gastroenterology

## 2010-10-05 DIAGNOSIS — R109 Unspecified abdominal pain: Secondary | ICD-10-CM

## 2010-10-05 DIAGNOSIS — R634 Abnormal weight loss: Secondary | ICD-10-CM

## 2010-10-05 DIAGNOSIS — R6881 Early satiety: Secondary | ICD-10-CM

## 2010-10-05 MED ORDER — OMEPRAZOLE 40 MG PO CPDR: 40.0000 mg | DELAYED_RELEASE_CAPSULE | Freq: Every day | ORAL | Status: DC

## 2010-10-05 NOTE — Patient Instructions (Addendum)
You will be set up for a CT scan of abdomen and pelvis with IV and oral contrast. Decrease the omeprazole to once daily (20-30 min before breakfast meal). Restart OTC zantac/pepcid, one pill at bedtime. A copy of this information will be made available to Dr. Alwyn Ren.     You have been scheduled for a CT scan of the abdomen and pelvis at [facility] ([address]---this is in the same building as [facility]).   You are scheduled on 10/09/10 at 1 pm. You should arrive 15 minutes prior to your appointment time for registration. Please follow the written instructions below on the day of your exam:  WARNING: IF YOU ARE ALLERGIC TO IODINE/X-RAY DYE, PLEASE NOTIFY RADIOLOGY IMMEDIATELY AT [phone]! YOU WILL BE GIVEN A 13 HOUR PREMEDICATION PREP.  1) Do not eat or drink anything after 9 am (4 hours prior to your test) 2) You have been given 2 bottles of oral contrast to drink. The solution may taste better if refrigerated, but do NOT add ice or any other liquid to this solution. Shake                  well before drinking.    Drink 1 bottle of contrast @ 11 am (2 hours prior to your exam)  Drink 1 bottle of contrast @ 12 noon  (1 hour prior to your exam)  You may take any medications as prescribed with a small amount of water except for the following: Metformin, Glucophage, Glucovance, Avandamet, Riomet, Fortamet, Actoplus Met, Janumet, Glumetza or Metaglip. The above medications must be held the day of the exam AND 48 hours after the exam.  The purpose of you drinking the oral contrast is to aid in the visualization of your intestinal tract. The contrast solution may cause some diarrhea. Before your exam is started, you will be given a small amount of fluid to drink. Depending on your individual set of symptoms, you may also receive an intravenous injection of x-ray contrast/dye. Plan on being at [employer] for 30 minutes or long, depending on the type of exam you  are having performed.  If you have any questions regarding your exam or if you need to reschedule, you may call the CT department at [phone] between the hours of 8:00 am and 5:00 pm, Monday-Friday.  ________________________________________________________________________

## 2010-10-05 NOTE — Progress Notes (Signed)
Review of pertinent gastrointestinal problems:  1. Routine risk for colon cancer, colonoscopy February 2011 found several small adenomatous. Recall colonoscopy at 3 year interval.  2. Gastritis, GERD symptoms , EGD September 2011 , biopsies of gastritis showed no H. Pylori. Improved on once daily proton pump inhibitor ( March 2012); nightly H2 blocker was added March 2012.   HPI: This is a pleasant 55 year old man whom I last saw about 5 months ago. He is here with his wife today.  He says he is better than last visits. He tried H2 blocker at bedtime with good reflief.   He increased his PPI to twice daily.    He complains of foul breath in AM.  Also has noises in left side of abd.  These are not new problems.  He has early satiety and he has lost 5 pounds in the past 5 months  caffinated and carbonated bevs make it worse.  STill has some burning in chest.    Past Medical History:   Hyperlipidemia                                               Allergic rhinitis                                            Supraspinatus tendon tear                                    Colon polyps                                                   Comment:adenomatous 03/2009   GERD (gastroesophageal reflux disease)                      Past Surgical History:   CORNEAL TRANSPLANT                                           BACK SURGERY                                                 nasal bone surgery                                           cardic catherization                            2005           Comment:negative   colon polyps  03-2009        Comment:Dr. repeat due 2014   reports that he has never smoked. He does not have any smokeless tobacco history on file. He reports that he drinks alcohol. He reports that he does not use illicit drugs.  family history includes Diabetes in his brother, father, mother, and sister; Heart disease in his brother and father;  Hypertension in his brother, father, mother, and sister; and Stroke in his brother and mother.    Current medicines and allergies were reviewed in [facility]    Physical Exam: BP 118/68  Pulse 72  Ht 5\' 6"  (1.676 m)  Wt 139 lb (63.05 kg)  BMI 22.44 kg/m2 Constitutional: generally well-appearing Psychiatric: alert and oriented x3 Abdomen: soft, nontender, nondistended, no obvious ascites, no peritoneal signs, normal bowel sounds     Assessment and plan: 55 y.o. male with abdominal fullness, mild early satiety, weight loss  He had EGD and colonoscopy last year I don't think those need to be repeated now. He'll get a CT scan to rule out malignancy given his weight loss and early satiety symptoms. Some of this may simply be functional dyspepsia. He complained a lot about noise on the left side of his abdomen and foul breath. Proton pump inhibitors can cause dyspepsia, abdominal discomfort and I recommended he decrease to once daily PPI and instead take over-the-counter H2 blocker at bedtime.

## 2010-10-09 ENCOUNTER — Ambulatory Visit (INDEPENDENT_AMBULATORY_CARE_PROVIDER_SITE_OTHER)
Admission: RE | Admit: 2010-10-09 | Discharge: 2010-10-09 | Disposition: A | Discharge status: Home / Self Care | Payer: BC Managed Care – PPO | Source: Ambulatory Visit | Attending: Gastroenterology | Admitting: Gastroenterology

## 2010-10-09 DIAGNOSIS — R109 Unspecified abdominal pain: Secondary | ICD-10-CM

## 2010-10-09 MED ORDER — IOHEXOL 300 MG/ML  SOLN
80.0000 mL | Freq: Once | INTRAMUSCULAR | Status: AC | PRN
  Administered 2010-10-09: 80 mL via INTRAVENOUS

## 2010-10-19 ENCOUNTER — Ambulatory Visit (INDEPENDENT_AMBULATORY_CARE_PROVIDER_SITE_OTHER): Payer: BC Managed Care – PPO | Admitting: Internal Medicine

## 2010-10-19 ENCOUNTER — Encounter: Payer: Self-pay | Admitting: Internal Medicine

## 2010-10-19 DIAGNOSIS — Z8619 Personal history of other infectious and parasitic diseases: Secondary | ICD-10-CM

## 2010-10-19 DIAGNOSIS — Z0000 Encounter for general adult medical examination without abnormal findings: Secondary | ICD-10-CM

## 2010-10-19 DIAGNOSIS — E785 Hyperlipidemia, unspecified: Secondary | ICD-10-CM

## 2010-10-19 DIAGNOSIS — Z23 Encounter for immunization: Secondary | ICD-10-CM

## 2010-10-19 DIAGNOSIS — Z86010 Personal history of colonic polyps: Secondary | ICD-10-CM

## 2010-10-19 LAB — LIPID PANEL
Cholesterol: 265 mg/dL — ABNORMAL HIGH (ref 0–200)
HDL: 60.5 mg/dL (ref >39.00)
Total CHOL/HDL Ratio: 4
Triglycerides: 295 mg/dL — ABNORMAL HIGH (ref 0.0–149.0)
VLDL: 59 mg/dL — ABNORMAL HIGH (ref 0.0–40.0)

## 2010-10-19 LAB — CK: Total CK: 111 U/L (ref 7–232)

## 2010-10-19 LAB — LDL CHOLESTEROL, DIRECT: Direct LDL: 166.6 mg/dL

## 2010-10-19 LAB — HEMOGLOBIN A1C: Hgb A1c MFr Bld: 5.9 % (ref 4.6–6.5)

## 2010-10-19 MED ORDER — TETANUS-DIPHTH-ACELL PERTUSSIS 5-2.5-18.5 LF-MCG/0.5 IM SUSP
0.5000 mL | Freq: Once | INTRAMUSCULAR | Status: AC
  Administered 2010-10-19: 0.5 mL via INTRAMUSCULAR

## 2010-10-19 NOTE — Patient Instructions (Addendum)
Preventive Health Care: Exercise at least 30-45 minutes a day,  3-4 days a week.  Eat a low-fat diet with lots of fruits and vegetables, up to 7-9 servings per day. Consume less than 40 grams of sugar per day from foods & drinks with High Fructose Corn Sugar as # 1,2,3 or # 4 on label.  Note: on Lipitor 40 mg 1/2 daily X 3 weeks

## 2010-10-19 NOTE — Progress Notes (Signed)
Subjective:    Patient ID: Kevin Farley, male    DOB: [birth year], 55 y.o.   MRN: [id number]  HPI  Kevin Farley  is here for a physical;acute issues include recent chest pain evaluation (see below)      Review of Systems Records from the med center were reviewed. He was seen 08/20/10 for chest pain. The only abnormality at that time with a glucose of 113. His A1c had been 6%, nondiabetic in May of 2011. No cardiac enzymes for 7/9 found in EMR. Chest x-ray suggested in the left upper lobe lobe nodule may be more prominent. Followup CAT scan was recommended. Followup stress test (08/14/10) and CAT scan (08/07/10) showed no change or new process.    Objective:   Physical Exam Gen.: Healthy and well-nourished in appearance. Alert, appropriate and cooperative throughout exam. Head: Normocephalic without obvious abnormalities;  no alopecia  Eyes: No corneal or conjunctival inflammation noted. Pupils equal round reactive to light and accommodation. Fundal exam is benign without hemorrhages, exudate, papilledema. Extraocular motion intact. Vision grossly decreased OS. Ears: External  ear exam reveals no significant lesions or deformities. Canals clear .TMs scarred; R > L. Hearing is grossly normal bilaterally. Nose: External nasal exam reveals no deformity or inflammation. Nasal mucosa are pink and moist. No lesions or exudates noted.  Mouth: Oral mucosa and oropharynx reveal no lesions or exudates. Teeth in good repair. Neck: No deformities, masses, or tenderness noted. Range of motion normal. Thyroid ? R lobe nodule. Lungs: Normal respiratory effort; chest expands symmetrically. Lungs are clear to auscultation without rales, wheezes, or increased work of breathing. Heart: Normal rate and rhythm. Normal S1 and S2. No gallop, click, or rub. No  murmur. Abdomen: Bowel sounds normal; abdomen soft and nontender. No masses, organomegaly or hernias noted. Genitalia: Varices on L. Colonoscopy 2011; PSA 0.63 in 2009.   .                                                                                    Musculoskeletal/extremities: No deformity or scoliosis noted of  the thoracic or lumbar spine. No clubbing, cyanosis, edema, or deformity noted. Range of motion  normal .Tone & strength  normal.Joints normal. Nail health  good. Vascular: Carotid, radial artery, dorsalis pedis and  posterior tibial pulses are full and equal. No bruits present. Neurologic: Alert and oriented x3. Deep tendon reflexes symmetrical and normal.          Skin: Intact without suspicious lesions or rashes. Keratosis & nevus  over abdomen ("unchanged") Lymph: No cervical, axillary, or inguinal lymphadenopathy present. Psych: Mood and affect are normal. Normally interactive                                                                                         Assessment & Plan:  #1 comprehensive  physical exam; no acute findings #2 see Problem List with Assessments & Recommendations  #3 chest pain, negative stress test. He has had a catheterization with "normal coronary arteries". I explained that this means that the size of the plaque is less than 30% of the lumen. My concern is the incredibly elevated LDL number and its particle distribution as noted. Additional concern is related to his family history particularly his brother's.  Subjectively he has been intolerant to statins. CK monitor of myalgias was discussed if statins are considered in the future.  #4 pulmonary nodule, stable by CT surveillance Plan: see Orders   I have encouraged him to seek the opinion of an  Board  Certified Lipidologist for second opinion concerning the cholesterol abnormalities.

## 2010-11-06 ENCOUNTER — Ambulatory Visit: Payer: BC Managed Care – PPO | Admitting: Gastroenterology

## 2010-12-12 ENCOUNTER — Ambulatory Visit (INDEPENDENT_AMBULATORY_CARE_PROVIDER_SITE_OTHER): Payer: BC Managed Care – PPO | Admitting: Internal Medicine

## 2010-12-12 ENCOUNTER — Encounter: Payer: Self-pay | Admitting: Internal Medicine

## 2010-12-12 DIAGNOSIS — E785 Hyperlipidemia, unspecified: Secondary | ICD-10-CM

## 2010-12-12 DIAGNOSIS — Z8249 Family history of ischemic heart disease and other diseases of the circulatory system: Secondary | ICD-10-CM

## 2010-12-12 DIAGNOSIS — R1013 Epigastric pain: Secondary | ICD-10-CM

## 2010-12-12 LAB — CK TOTAL AND CKMB (NOT AT ARMC)
CK, MB: 1.3 ng/mL (ref 0.3–4.0)
Total CK: 90 U/L (ref 7–232)

## 2010-12-12 LAB — TROPONIN I: Troponin I: <0.01 ng/mL (ref <0.06)

## 2010-12-12 MED ORDER — DEXLANSOPRAZOLE 60 MG PO CPDR: 60.0000 mg | DELAYED_RELEASE_CAPSULE | Freq: Every day | ORAL | Status: DC

## 2010-12-12 NOTE — Patient Instructions (Signed)
The triggers for dyspepsia or "heart burn"  include stress; the "aspirin family" ; alcohol; peppermint; and caffeine (coffee, tea, cola, and chocolate). The aspirin family would include aspirin and the nonsteroidal agents such as ibuprofen &  Naproxen. Tylenol would not cause reflux. If having dyspepsia ; food & drink should be avoided for @ least 2 hours before going to bed.  

## 2010-12-12 NOTE — Progress Notes (Signed)
  Subjective:    Patient ID: Kevin Farley, male    DOB: 1955-09-23, 55 y.o.   MRN: [id number]  HPI ABDOMINAL DISCOMFORT: Location: epigastrium  Onset: 4 days ago; he stopped Omeprazole 5 days because of burning   Radiation: no  Severity: up to 5 Quality: burning  Duration: 5 minutes  Better with: position change  Worse with: prone position Symptoms Nausea/Vomiting: no  Diarrhea: no  Constipation: no  Melena/BRBPR: no  Hematemesis: no  Anorexia: no  Fever/Chills: no  Dysuria: no  Rash: no  Wt loss:? 2 #  EtOH use: 2 glasses of wine nightly NSAIDs/ASA: no  Past Surgeries: Endoscopy by Dr Christella Hartigan 2011: gastritis. PMH of colon polyp ; repeat due 2014       Review of Systems     Objective:   Physical Exam General appearance is one of good health and nourishment w/o distress.  Eyes: No conjunctival inflammation or scleral icterus is present.  Oral exam: Dental hygiene is good; lips and gums are healthy appearing.There is no oropharyngeal erythema or exudate noted.   Heart:  Normal rate and regular rhythm. S1 and S2 normal without gallop, murmur, click, rub or other extra sounds     Lungs:Chest clear to auscultation; no wheezes, rhonchi,rales ,or rubs present.No increased work of breathing.   Abdomen: bowel sounds normal, soft and non-tender without masses, organomegaly or hernias noted.  No guarding or rebound   Skin:Warm & dry.  Intact without suspicious lesions or rashes ; no jaundice or tenting  Lymphatic: No lymphadenopathy is noted about the head, neck, axilla areas.              Assessment & Plan:  #1 epigastric burning in the context of a past history of endoscopic documented gastritis  #2  dyslipidemia with increased cardiovascular risk  Plan: See orders and recommendations.

## 2010-12-13 ENCOUNTER — Other Ambulatory Visit: Payer: Self-pay | Admitting: Internal Medicine

## 2010-12-13 MED ORDER — LORAZEPAM 1 MG PO TABS: ORAL_TABLET | ORAL | Status: DC

## 2010-12-13 NOTE — Telephone Encounter (Signed)
Lorazepam request, per VO Dr Ulla Potash for 1 mg Sig: Take 1/2 Q 8-12 hrs prn #30x0

## 2010-12-18 ENCOUNTER — Ambulatory Visit: Payer: BC Managed Care – PPO | Admitting: Gastroenterology

## 2010-12-20 ENCOUNTER — Other Ambulatory Visit: Payer: Self-pay | Admitting: Internal Medicine

## 2010-12-20 DIAGNOSIS — R1013 Epigastric pain: Secondary | ICD-10-CM

## 2010-12-20 MED ORDER — DEXLANSOPRAZOLE 60 MG PO CPDR: 60.0000 mg | DELAYED_RELEASE_CAPSULE | Freq: Every day | ORAL | Status: DC

## 2010-12-20 NOTE — Telephone Encounter (Signed)
#  30, refill x5

## 2010-12-20 NOTE — Telephone Encounter (Signed)
Patient needs rx for dexilant 60mg  -- [facility] [address]

## 2010-12-20 NOTE — Telephone Encounter (Signed)
Rx sent 

## 2010-12-20 NOTE — Telephone Encounter (Signed)
Dr.Hopper please advise on refill request, last filled 12/12/10 for #10 (ONLY), ? Is this a long-term medication or a trial medication

## 2011-04-22 ENCOUNTER — Encounter: Payer: Self-pay | Admitting: Family

## 2011-04-22 ENCOUNTER — Ambulatory Visit (INDEPENDENT_AMBULATORY_CARE_PROVIDER_SITE_OTHER): Payer: BC Managed Care – PPO | Admitting: Family

## 2011-04-22 VITALS — BP 116/80 | HR 78 | Temp 98.1°F | Resp 16 | Wt 141.0 lb

## 2011-04-22 DIAGNOSIS — R109 Unspecified abdominal pain: Secondary | ICD-10-CM

## 2011-04-22 DIAGNOSIS — M7918 Myalgia, other site: Secondary | ICD-10-CM

## 2011-04-22 DIAGNOSIS — IMO0001 Reserved for inherently not codable concepts without codable children: Secondary | ICD-10-CM

## 2011-04-22 LAB — POCT URINALYSIS DIPSTICK
Bilirubin, UA: NEGATIVE
Blood, UA: NEGATIVE
Glucose, UA: NEGATIVE
Ketones, UA: NEGATIVE
Leukocytes, UA: NEGATIVE
Nitrite, UA: NEGATIVE
Protein, UA: NEGATIVE
Spec Grav, UA: <=1.005
Urobilinogen, UA: 0.2
pH, UA: 7

## 2011-04-22 MED ORDER — TRAMADOL HCL 50 MG PO TABS: ORAL_TABLET | ORAL | Status: DC

## 2011-04-22 MED ORDER — CYCLOBENZAPRINE HCL 5 MG PO TABS: 5.0000 mg | ORAL_TABLET | Freq: Three times a day (TID) | ORAL | Status: AC | PRN

## 2011-04-22 NOTE — Progress Notes (Signed)
Subjective:    Patient ID: Kevin Farley, male    DOB: 04-19-1955, 56 y.o.   MRN: [id number]  HPI  Mr.  Kevin Farley is a 56 yr old male who presents today with chief complaint of left sided flank pain.  He reports that his discomfort started yesterday on the left and is radiating around the front to the front.  Present x 2 days. He denies associated dysuria, hematuria, fever or chills.  Does report mild dizziness. Pain was described as moderate yesterday, 6/10 but is reported as improved today. He denies hx of kidney stone.   Review of Systems See HPI  Past Medical History  Diagnosis Date  . Hyperlipidemia   . Allergic rhinitis   . Supraspinatus tendon tear   . Colon polyps     adenomatous 03/2009  . GERD (gastroesophageal reflux disease)   . Family history of ischemic heart disease   . Other abnormal glucose     A1c 6% in 5/11    History   Social History  . Marital Status: Married    Spouse Name: N/A    Number of Children: N/A  . Years of Education: N/A   Occupational History  . manager of hotels     Social History Main Topics  . Smoking status: Never Smoker   . Smokeless tobacco: Not on file  . Alcohol Use: 8.4 oz/week    14 Glasses of wine per week     two glasses of wine with dinner every night  . Drug Use: No  . Sexually Active: Not on file   Other Topics Concern  . Not on file   Social History Narrative   Never smokedRegular dietDoes exercise 2x weekCoffee 1 daily    Past Surgical History  Procedure Date  . Corneal transplant   . Back surgery     Dr Channing Mutters, [place]  . Nasal bone surgery   . Cardic catherization 2005    negative  . Colon polyps 03-2009    Dr.Jacobs repeat due 2014  . Upper gastrointestinal endoscopy 2011    mild gastritis; Dr Christella Hartigan    Family History  Problem Relation Age of Onset  . Hypertension Mother   . Diabetes Mother   . Stroke Mother   . Diabetes Father   . Hypertension Father   . Heart disease Father     had open heart surgery  .  Diabetes Brother   . Hypertension Brother   . Heart disease Brother      X 2 had MI in 34s  . Stroke Brother   . Diabetes Sister   . Hypertension Sister     Allergies  Allergen Reactions  . Aspirin     REACTION: hives  . Ezetimibe-Simvastatin     REACTION: myalgia    Current Outpatient Prescriptions on File Prior to Visit  Medication Sig Dispense Refill  . acetaminophen (TYLENOL ARTHRITIS PAIN) 650 MG CR tablet Take 650 mg by mouth every 8 (eight) hours as needed. Pain       . atorvastatin (LIPITOR) 80 MG tablet Take 40 mg by mouth daily.       . Flaxseed, Linseed, (FLAX SEED OIL PO) Take 1 capsule by mouth daily.        . fluticasone (FLONASE) 50 MCG/ACT nasal spray Place 2 sprays into the nose daily. As needed      . Lansoprazole (PREVACID PO) Take by mouth daily.        . Liniments (BIOFREEZE EX) Apply 1  application topically 3 (three) times daily as needed. For pain       . LORazepam (ATIVAN) 1 MG tablet Take 0.5 tablet by mouth every 8-12 hours as needed.  30 tablet  0  . Multiple Vitamins-Minerals (MULTIVITAL PO) Take 1 tablet by mouth daily.        . Omega-3 Fatty Acids (FISH OIL) 1000 MG CAPS Take 1 capsule by mouth daily.        Marland Kitchen omeprazole (PRILOSEC) 40 MG capsule Take 1 capsule (40 mg total) by mouth daily.  180 capsule  1  . traMADol (ULTRAM) 50 MG tablet Take 50 mg by mouth every 12 (twelve) hours as needed. Pain         BP 116/80  Pulse 78  Temp(Src) 98.1 F (36.7 C) (Oral)  Resp 16  Wt 141 lb (63.957 kg)  SpO2 97%       Objective:   Physical Exam  Constitutional: He appears well-developed and well-nourished. No distress.  Cardiovascular: Normal rate and regular rhythm.   No murmur heard. Pulmonary/Chest: Breath sounds normal. No respiratory distress. He has no wheezes. He has no rales. He exhibits no tenderness.  Abdominal: Soft. He exhibits no distension and no mass. There is no tenderness. There is no rebound and no guarding.  Genitourinary:        Neg CVAT bilaterally          Assessment & Plan:

## 2011-04-22 NOTE — Patient Instructions (Signed)
meloxicam 7.5mg  once daily for the next few days.  Flexeril 5 mg 3 times daily as needed for back pain. Call if symptoms worsening, or if no improvement over the next few days.

## 2011-04-23 ENCOUNTER — Ambulatory Visit (INDEPENDENT_AMBULATORY_CARE_PROVIDER_SITE_OTHER): Payer: BC Managed Care – PPO | Admitting: Gastroenterology

## 2011-04-23 ENCOUNTER — Encounter: Payer: Self-pay | Admitting: Gastroenterology

## 2011-04-23 VITALS — BP 110/70 | HR 76 | Ht 66.0 in | Wt 141.0 lb

## 2011-04-23 DIAGNOSIS — M7918 Myalgia, other site: Secondary | ICD-10-CM | POA: Insufficient documentation

## 2011-04-23 DIAGNOSIS — K219 Gastro-esophageal reflux disease without esophagitis: Secondary | ICD-10-CM

## 2011-04-23 NOTE — Assessment & Plan Note (Signed)
UA is neg for blood.  No urinary complaints.  No CVAT, abd exam is benign.  Suspect MS etiology for pt's pain.  Due to aspirin allergy, will rx tramadol (rather than meloxicam as I had initially planned).  Flexeril prn.  Pt to call if symptoms worsen, or if no improvement in next few days.  He verbalizes understanding.

## 2011-04-23 NOTE — Patient Instructions (Addendum)
Start taking (OTC) pepcid or zantac (buy the generic if possible). Take one pill at bedtime every night. Cutting back a bit on evening alcohol may also help your nighttime burning, GERD. GERD handout given.    Gastroesophageal reflux disease (GERD) happens when acid from your stomach flows up into the esophagus. When acid comes in contact with the esophagus, the acid causes soreness (inflammation) in the esophagus. Over time, GERD may create small holes (ulcers) in the lining of the esophagus. CAUSES   Increased body weight. This puts pressure on the stomach, making acid rise from the stomach into the esophagus.   Smoking. This increases acid production in the stomach.   Drinking alcohol. This causes decreased pressure in the lower esophageal sphincter (valve or ring of muscle between the esophagus and stomach), allowing acid from the stomach into the esophagus.   Late evening meals and a full stomach. This increases pressure and acid production in the stomach.   A malformed lower esophageal sphincter.  Sometimes, no cause is found. SYMPTOMS   Burning pain in the lower part of the mid-chest behind the breastbone and in the mid-stomach area. This may occur twice a week or more often.   Trouble swallowing.   Sore throat.   Dry cough.   Asthma-like symptoms including chest tightness, shortness of breath, or wheezing.  DIAGNOSIS  Your caregiver may be able to diagnose GERD based on your symptoms. In some cases, X-rays and other tests may be done to check for complications or to check the condition of your stomach and esophagus. TREATMENT  Your caregiver may recommend over-the-counter or prescription medicines to help decrease acid production. Ask your caregiver before starting or adding any new medicines.  HOME CARE INSTRUCTIONS   Change the factors that you can control. Ask your caregiver for guidance concerning weight loss, quitting smoking, and alcohol consumption.   Avoid foods  and drinks that make your symptoms worse, such as:   Caffeine or alcoholic drinks.   Chocolate.   Peppermint or mint flavorings.   Garlic and onions.   Spicy foods.   Citrus fruits, such as oranges, lemons, or limes.   Tomato-based foods such as sauce, chili, salsa, and pizza.   Fried and fatty foods.   Avoid lying down for the 3 hours prior to your bedtime or prior to taking a nap.   Eat small, frequent meals instead of large meals.   Wear loose-fitting clothing. Do not wear anything tight around your waist that causes pressure on your stomach.   Raise the head of your bed 6 to 8 inches with wood blocks to help you sleep. Extra pillows will not help.   Only take over-the-counter or prescription medicines for pain, discomfort, or fever as directed by your caregiver.   Do not take aspirin, ibuprofen, or other nonsteroidal anti-inflammatory drugs (NSAIDs).  SEEK IMMEDIATE MEDICAL CARE IF:   You have pain in your arms, neck, jaw, teeth, or back.   Your pain increases or changes in intensity or duration.   You develop nausea, vomiting, or sweating (diaphoresis).   You develop shortness of breath, or you faint.   Your vomit is green, yellow, black, or looks like coffee grounds or blood.   Your stool is red, bloody, or black.  These symptoms could be signs of other problems, such as heart disease, gastric bleeding, or esophageal bleeding. MAKE SURE YOU:   Understand these instructions.   Will watch your condition.   Will get help right away if  you are not doing well or get worse.  Document Released: 11/07/2004 Document Revised: 01/17/2011 Document Reviewed: 08/17/2010 [facility] Patient Information 2012 [place], [place].

## 2011-04-23 NOTE — Progress Notes (Signed)
Review of pertinent gastrointestinal problems:  1. Routine risk for colon cancer, colonoscopy February 2011 found several small adenomatous. Recall colonoscopy at 3 year interval.  2. Gastritis, GERD symptoms, dyspepsia , EGD September 2011 , biopsies of gastritis showed no H. Pylori. Improved on once daily proton pump inhibitor ( March 2012); nightly H2 blocker was added March 2012.   August 2012 CT scan showed gallstones in his gallbladder, otherwise normal.    HPI: This is a  pleasant 56 year old man whom I last saw 5 or 6 months ago.  Last visit here 5-6 months ago.  3-4 months ago changed to once a day dexlinant.  This helps a little bit with the burping. Still with burning at night, laying down.  Bad breath in AM.   He has not been taking H2 blocker at bedtime as we discussed earlier.  He has been having some left flank pains that are of unclear etiology. They do not seem related to being or moving his bowels.   Past Medical History  Diagnosis Date  . Hyperlipidemia   . Allergic rhinitis   . Supraspinatus tendon tear   . Colon polyps     adenomatous 03/2009  . GERD (gastroesophageal reflux disease)   . Family history of ischemic heart disease   . Other abnormal glucose     A1c 6% in 5/11    Past Surgical History  Procedure Date  . Corneal transplant   . Back surgery     Dr Channing Mutters, [place]  . Nasal bone surgery   . Cardic catherization 2005    negative  . Colon polyps 03-2009    Dr. repeat due 2014  . Upper gastrointestinal endoscopy 2011    mild gastritis; Dr Christella Hartigan    Current Outpatient Prescriptions  Medication Sig Dispense Refill  . acetaminophen (TYLENOL ARTHRITIS PAIN) 650 MG CR tablet Take 650 mg by mouth every 8 (eight) hours as needed. Pain       . atorvastatin (LIPITOR) 80 MG tablet Take 40 mg by mouth daily.       . cyclobenzaprine (FLEXERIL) 5 MG tablet Take 1 tablet (5 mg total) by mouth 3 (three) times daily as needed for muscle spasms.  20 tablet  0  .  dexlansoprazole (DEXILANT) 60 MG capsule Take 60 mg by mouth daily.      . Flaxseed, Linseed, (FLAX SEED OIL PO) Take 1 capsule by mouth daily.        . fluticasone (FLONASE) 50 MCG/ACT nasal spray Place 2 sprays into the nose daily. As needed      . Liniments (BIOFREEZE EX) Apply 1 application topically 3 (three) times daily as needed. For pain       . LORazepam (ATIVAN) 1 MG tablet Take 0.5 tablet by mouth every 8-12 hours as needed.  30 tablet  0  . Multiple Vitamins-Minerals (MULTIVITAL PO) Take 1 tablet by mouth daily.        . Omega-3 Fatty Acids (FISH OIL) 1000 MG CAPS Take 1 capsule by mouth daily.        Marland Kitchen omeprazole (PRILOSEC) 40 MG capsule Take 1 capsule (40 mg total) by mouth daily.  180 capsule  1  . traMADol (ULTRAM) 50 MG tablet One tablet every 8 hours as needed for pain (may cause drowsiness)  20 tablet  0    Allergies as of 04/23/2011 - Review Complete 04/23/2011  Allergen Reaction Noted  . Aspirin  11/24/2008  . Ezetimibe-simvastatin  05/01/2009    Family  History  Problem Relation Age of Onset  . Hypertension Mother   . Diabetes Mother   . Stroke Mother   . Diabetes Father   . Hypertension Father   . Heart disease Father     had open heart surgery  . Diabetes Brother   . Hypertension Brother   . Heart disease Brother      X 2 had MI in 80s  . Stroke Brother   . Diabetes Sister   . Hypertension Sister     History   Social History  . Marital Status: Married    Spouse Name: N/A    Number of Children: 1  . Years of Education: N/A   Occupational History  . manager of hotels    . OWNER    Social History Main Topics  . Smoking status: Never Smoker   . Smokeless tobacco: Never Used  . Alcohol Use: 8.4 oz/week    14 Glasses of wine per week     two glasses of wine with dinner every night  . Drug Use: No  . Sexually Active: Not on file   Other Topics Concern  . Not on file   Social History Narrative   Never smokedRegular dietDoes exercise 2x  weekCoffee 1 daily      Physical Exam: BP 110/70  Pulse 76  Ht 5\' 6"  (1.676 m)  Wt 141 lb (63.957 kg)  BMI 22.76 kg/m2 Constitutional: generally well-appearing Psychiatric: alert and oriented x3 Abdomen: soft, nontender, nondistended, no obvious ascites, no peritoneal signs, normal bowel sounds     Assessment and plan: 56 y.o. male with GERD, dyspepsia  I again recommended nightly H2 blocker. He drinks 2-3 glasses of wine a night. He says one bottle will last usually 2 days. This amount of alcohol, especially at night, could contribute to GERD symptoms I recommend he try to cut back if he can. He will stay on dexilant once daily as well.

## 2011-05-29 ENCOUNTER — Other Ambulatory Visit: Payer: Self-pay | Admitting: Internal Medicine

## 2011-05-29 NOTE — Telephone Encounter (Signed)
refill Fluticasone Prop 50 MCG Spray   Not prescribed by [facility] before   [facility] Refills 2 Use 2-sprays each nostril once daily  Last filled 7.25.12

## 2011-05-30 MED ORDER — FLUTICASONE PROPIONATE 50 MCG/ACT NA SUSP: 2.0000 | Freq: Every day | NASAL | Status: DC

## 2011-05-30 NOTE — Telephone Encounter (Signed)
RX sent

## 2011-06-04 ENCOUNTER — Ambulatory Visit (INDEPENDENT_AMBULATORY_CARE_PROVIDER_SITE_OTHER): Payer: BC Managed Care – PPO | Admitting: Internal Medicine

## 2011-06-04 ENCOUNTER — Encounter: Payer: Self-pay | Admitting: Internal Medicine

## 2011-06-04 VITALS — BP 130/82 | HR 76 | Temp 98.1°F | Wt 142.6 lb

## 2011-06-04 DIAGNOSIS — K219 Gastro-esophageal reflux disease without esophagitis: Secondary | ICD-10-CM

## 2011-06-04 DIAGNOSIS — R0789 Other chest pain: Secondary | ICD-10-CM

## 2011-06-04 DIAGNOSIS — R0681 Apnea, not elsewhere classified: Secondary | ICD-10-CM

## 2011-06-04 DIAGNOSIS — R0609 Other forms of dyspnea: Secondary | ICD-10-CM

## 2011-06-04 DIAGNOSIS — G479 Sleep disorder, unspecified: Secondary | ICD-10-CM

## 2011-06-04 DIAGNOSIS — G478 Other sleep disorders: Secondary | ICD-10-CM

## 2011-06-04 DIAGNOSIS — R0683 Snoring: Secondary | ICD-10-CM

## 2011-06-04 DIAGNOSIS — R0989 Other specified symptoms and signs involving the circulatory and respiratory systems: Secondary | ICD-10-CM

## 2011-06-04 NOTE — Patient Instructions (Signed)
The triggers for reflux  include stress; the "aspirin family" ; alcohol; peppermint; and caffeine (coffee, tea, cola, and chocolate). The aspirin family would include aspirin and the nonsteroidal agents such as ibuprofen &  Naproxen. Tylenol would not cause reflux. If having symptoms ; food & drink should be avoided for @ least 2 hours before going to bed.  

## 2011-06-04 NOTE — Progress Notes (Signed)
  Subjective:    Patient ID: Kevin Farley, male    DOB: 1955/10/05, 56 y.o.   MRN: [id number]  HPI He's had some sleep issues for years but this has become progressive in the last 4-5 months. Lorazepam as needed at bedtime it is of benefit. A generic Tylenol PM products has also helped, but  it causes a "hangover" the next morning. There is no specific trigger or for this change in his sleep pattern.    Review of Systems  Pattern of sleep issues Difficulty going to sleep:no Frequent awakening:no Early awakening:after 3-4 hrs of sleep due to sweating & warm in chest area Nightmares:no Abnormal leg movement:no Snoring: Significant at times according to his wife; she makes remarks that is better after he had nasal surgery Apnea:yes Risk factors/sleep hygiene: Stimulants:no Alcohol intake:1-2 glasses with dinner Reading, watching TV, eating in bed:no Daytime naps:no Stress/anxiety: His wife describes him as a Solicitor factors:no, only once a year Impact: Daytime hypersomnolence: yes Motor vehicle accident/motor dysfunction:no  He has intermittent exertional left chest discomfort. He last saw his Cardiologist in late January; he states his cardiologist is aware of his chest symptoms. LDL at that time was significantly improved with a value of 151; HDL was 67. Glucose was 116. He denies frank dysphagia but does have dyspepsia despite taking Dexilant. He denies unexplained weight loss, melena, rectal bleeding     Objective:   Physical Exam General appearance is one of good health and nourishment w/o distress.  Eyes: No conjunctival inflammation or scleral icterus is present.  Oral exam: Dental hygiene is good; lips and gums are healthy appearing.There is no oropharyngeal erythema or exudate noted. The oral pharynx area is not decreased  Nares: Fully patent with no obstruction  Heart:  Normal rate and regular rhythm. S1 and S2 normal without gallop,  click, rub or other  extra sounds  . Grade 1/2-1 systolic murmur at the lower sternal border   Lungs:Chest clear to auscultation; no wheezes, rhonchi,rales ,or rubs present.No increased work of breathing.   Abdomen: bowel sounds normal, soft and non-tender without masses, organomegaly or hernias noted.  No guarding or rebound   Vascular: All pulses are intact. Homans sign is negative  Skin:Warm & dry.  Intact without suspicious lesions or rashes ; no jaundice   Lymphatic: No lymphadenopathy is noted about the head, neck, axilla areas.              Assessment & Plan:  1 sleep disorder manifested by early awakening in the context of some chest discomfort and sweating. This may represent nocturnal reflux. Additionally he has some snoring and intermittent apnea. Definitive sleep evaluation is needed.  #2 probable symptomatic reflux despite Dexilant. GI assessment is indicated once #1 is  addressed  #3 chest pain, atypical; this  has been evaluated by his Cardiologist; as noted GI follow up  would be the next step.

## 2011-06-18 ENCOUNTER — Ambulatory Visit (INDEPENDENT_AMBULATORY_CARE_PROVIDER_SITE_OTHER): Payer: BC Managed Care – PPO | Admitting: Internal Medicine

## 2011-06-18 ENCOUNTER — Encounter: Payer: Self-pay | Admitting: Internal Medicine

## 2011-06-18 VITALS — BP 112/78 | HR 79 | Temp 98.9°F | Wt 140.6 lb

## 2011-06-18 DIAGNOSIS — R059 Cough, unspecified: Secondary | ICD-10-CM

## 2011-06-18 DIAGNOSIS — R05 Cough: Secondary | ICD-10-CM

## 2011-06-18 DIAGNOSIS — J029 Acute pharyngitis, unspecified: Secondary | ICD-10-CM

## 2011-06-18 LAB — POCT RAPID STREP A (OFFICE): Rapid Strep A Screen: NEGATIVE

## 2011-06-18 MED ORDER — AZITHROMYCIN 250 MG PO TABS: ORAL_TABLET | ORAL | Status: AC

## 2011-06-18 NOTE — Progress Notes (Signed)
  Subjective:    Patient ID: Kevin Farley, male    DOB: 1955/04/05, 56 y.o.   MRN: [id number]  HPI Onset 5/4 as ST @ night followed by NP cough.Saline gargles  & Chloraseptic spray helped.    Review of Systems  Present symptoms: Fever/chills/sweats:sweats last night Frontal headache:no Facial pain:no Nasal purulence: no Dental pain:no Lymphadenopathy:no Wheezing/shortness of breath:no Associated extrinsic/allergic symptoms:itchy eyes/ sneezing:for weeks            Objective:   Physical Exam General appearance:good health ;well nourished; no acute distress or increased work of breathing is present.  No  lymphadenopathy about the head, neck, or axilla noted.   Eyes: No conjunctival inflammation or lid edema is present.   Ears:  External ear exam shows no significant lesions or deformities.  Otoscopic examination reveals clear canals, tympanic membranes are intact bilaterally without bulging, retraction, inflammation or discharge.  Nose:  External nasal examination shows no deformity or inflammation. Nasal mucosa are pink and moist without lesions or exudates. No septal dislocation or deviation.No obstruction to airflow.   Oral exam: Dental hygiene is good; lips and gums are healthy appearing.There is no oropharyngeal erythema or exudate noted.    Heart:  Normal rate and regular rhythm. S1 and S2 normal without gallop, murmur, click, rub . S 4  Lungs:Chest clear to auscultation; no wheezes, rhonchi,rales ,or rubs present.No increased work of breathing.    Extremities:  No cyanosis, edema, or clubbing  noted    Skin: Warm & dry .          Assessment & Plan:  #1 pharyngitis #2 cough Plan: See orders and recommendations

## 2011-06-18 NOTE — Patient Instructions (Signed)
Zicam Melts or Zinc lozenges ; vitamin C 2000 mg daily; & Echinacea for 4-7 days. Report fever, exudate("pus") or progressive pain. Flonase 1 spray in each nostril twice a day as needed. Use the "crossover" technique as discussed. Nasal cleansing in the shower as discussed. Make sure that all residual soap is removed to prevent irritation.

## 2011-07-09 ENCOUNTER — Encounter: Payer: Self-pay | Admitting: Pulmonary Disease

## 2011-07-09 ENCOUNTER — Ambulatory Visit (INDEPENDENT_AMBULATORY_CARE_PROVIDER_SITE_OTHER): Payer: BC Managed Care – PPO | Admitting: Pulmonary Disease

## 2011-07-09 VITALS — BP 110/80 | HR 76 | Temp 98.1°F | Ht 66.0 in | Wt 144.0 lb

## 2011-07-09 DIAGNOSIS — G4733 Obstructive sleep apnea (adult) (pediatric): Secondary | ICD-10-CM | POA: Insufficient documentation

## 2011-07-09 DIAGNOSIS — G4700 Insomnia, unspecified: Secondary | ICD-10-CM | POA: Insufficient documentation

## 2011-07-09 NOTE — Assessment & Plan Note (Signed)
Your main issue seems to be insomnia - difficulty staying asleep. Physical activity (walk, treadmill) x 30 mins daily Shower late evening Avoid caffeinated beverages DO not use computer or watch TV after waking up at night - sit up in a chair -listen to music or read until sleepy again Trial of MELATONIN 5 mg 3 h before bedtime Call me & report in 2-3 weeks - if this does not work -we can send in Rx for  [employer]

## 2011-07-09 NOTE — Assessment & Plan Note (Signed)
We will perform a home sleep test for obstructive sleep apnea due to snoring, witnessed apneas & gasping episodes since no comorbidities - low pre test prob

## 2011-07-09 NOTE — Progress Notes (Signed)
  Subjective:    Patient ID: Kevin Farley, male    DOB: 1955-11-30, 56 y.o.   MRN: [id number]  HPI 56 y.o never smoker for evaluation of insomnia & witnessed apneas He has had some sleep issues for years but this has become progressive in the last 4-5 months. He reports frequent nocturnal awakenings. Bedtime is 1100 pm, latency is 10 mins, wakes up around 3 am & can stay awake for as long as 2 hrs - he watches tv in bed or works on the computer. He is then awake again at 0600 but stays in bed until 0730, sleeps on his side x 2 pillows, denies GERD symptoms or restless legs. Lorazepam as needed at bedtime it is of benefit. A generic Tylenol PM product has also helped, but both of these products causes a "hangover" the next morning. There is no specific trigger or for this change in his sleep pattern. Wife has noted snoring & witnessed apneas. This is mildly better since sinus surgery in 2005. ESS is 3/24. He does not nap in the daytime. 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 There is no history suggestive of cataplexy, sleep paralysis or parasomnias Atorvastatin has caused muscle aches  Past Medical History  Diagnosis Date  . Hyperlipidemia   . Allergic rhinitis   . Supraspinatus tendon tear   . Colon polyps     adenomatous 03/2009  . GERD (gastroesophageal reflux disease)   . Family history of ischemic heart disease   . Other abnormal glucose     A1c 6% in 5/11   Past Surgical History  Procedure Date  . Corneal transplant   . Back surgery     Dr Channing Mutters, [place]  . Nasal bone surgery   . Cardic catherization 2005    negative  . Colon polyps 03-2009    Dr.Jacobs repeat due 2014  . Upper gastrointestinal endoscopy 2011    mild gastritis; Dr Christella Hartigan    Allergies  Allergen Reactions  . Aspirin     REACTION: hives  . Ezetimibe-Simvastatin     REACTION: myalgia       Review of Systems  Constitutional: Negative for fever and unexpected weight change.  HENT: Positive for  ear pain and sneezing. Negative for nosebleeds, congestion, sore throat, rhinorrhea, trouble swallowing, dental problem, postnasal drip and sinus pressure.   Eyes: Negative for redness and itching.  Respiratory: Positive for cough. Negative for chest tightness, shortness of breath and wheezing.   Cardiovascular: Negative for palpitations and leg swelling.  Gastrointestinal: Negative for nausea and vomiting.  Genitourinary: Negative for dysuria.  Musculoskeletal: Negative for joint swelling.  Skin: Negative for rash.  Neurological: Negative for headaches.  Hematological: Does not bruise/bleed easily.  Psychiatric/Behavioral: Negative for dysphoric mood. The patient is not nervous/anxious.        Objective:   Physical Exam  Gen. Pleasant, well-nourished, in no distress, normal affect ENT - no lesions, no post nasal drip Neck: No JVD, no thyromegaly, no carotid bruits Lungs: no use of accessory muscles, no dullness to percussion, clear without rales or rhonchi  Cardiovascular: Rhythm regular, heart sounds  normal, no murmurs or gallops, no peripheral edema Abdomen: soft and non-tender, no hepatosplenomegaly, BS normal. Musculoskeletal: No deformities, no cyanosis or clubbing Neuro:  alert, non focal       Assessment & Plan:

## 2011-07-09 NOTE — Patient Instructions (Signed)
Your main issue seems to be insomnia - difficulty staying asleep. We will perform a home sleep test for obstructive sleep apnea due to snoring & gasping episodes Physical activity (walk, treadmill) x 30 mins daily Shower late evening Avoid caffeinated beverages DO not use computer or watch TV after waking up at night - sit up in a chair -listen to music or read until sleepy again Trial of MELATONIN 5 mg 3 h before bedtime Call me & report in 2-3 weeks - if this does not work -we can send in Rx for  [employer]

## 2011-07-17 ENCOUNTER — Other Ambulatory Visit: Payer: Self-pay | Admitting: Pulmonary Disease

## 2011-07-17 DIAGNOSIS — G4733 Obstructive sleep apnea (adult) (pediatric): Secondary | ICD-10-CM

## 2011-07-31 ENCOUNTER — Ambulatory Visit (HOSPITAL_BASED_OUTPATIENT_CLINIC_OR_DEPARTMENT_OTHER): Payer: BC Managed Care – PPO | Attending: Pulmonary Disease

## 2011-07-31 VITALS — Ht 66.0 in | Wt 138.0 lb

## 2011-07-31 DIAGNOSIS — R0609 Other forms of dyspnea: Secondary | ICD-10-CM | POA: Insufficient documentation

## 2011-07-31 DIAGNOSIS — R0989 Other specified symptoms and signs involving the circulatory and respiratory systems: Secondary | ICD-10-CM | POA: Insufficient documentation

## 2011-07-31 DIAGNOSIS — G4733 Obstructive sleep apnea (adult) (pediatric): Secondary | ICD-10-CM

## 2011-08-04 ENCOUNTER — Other Ambulatory Visit: Payer: Self-pay | Admitting: Internal Medicine

## 2011-08-10 DIAGNOSIS — R0609 Other forms of dyspnea: Secondary | ICD-10-CM

## 2011-08-10 DIAGNOSIS — R0989 Other specified symptoms and signs involving the circulatory and respiratory systems: Secondary | ICD-10-CM

## 2011-08-10 NOTE — Procedures (Signed)
Kevin Farley, Kevin Farley                   ACCOUNT NO.:  [id number]  MEDICAL RECORD NO.:  [id number]          PATIENT TYPE:  OUT  LOCATION:  SLEEP CENTER                 FACILITY:  [facility]  PHYSICIAN:  Oretha Milch, MD      DATE OF BIRTH:  03-04-1955  DATE OF STUDY:  07/31/2011                           NOCTURNAL POLYSOMNOGRAM  REFERRING PHYSICIAN:  Oretha Milch, MD  INDICATION FOR STUDY:  Mr. Jaivian is a 56 year old man with loud snoring, witnessed apneas, and frequent awakenings and some degree of insomnia. At the time of this study, he weighed 138 pounds with a height of 5 feet and 6 inches, BMI of 27, neck size of 14 inches.  EPWORTH SLEEPINESS SCORE:  22.  This nocturnal polysomnogram was performed with a sleep technologist in attendance.  EEG, EOG, EMG, EKG, and respiratory parameters were recorded.  Sleep stages, arousals, limb movements, and respiratory data were scored according to criteria laid out by the American Academy of Sleep Medicine.  MEDICATIONS:  SLEEP ARCHITECTURE:  Lights out was at 10:49 p.m., lights on was at 5 a.m.  Total sleep time was 304 minutes with a sleep period time of 348 minutes and a sleep efficiency of 82%.  Sleep latency was 22 minutes and latency to REM sleep was 65 minutes.  Awake after sleep onset was 44 minutes.  Sleep stages as the percentage of total sleep time was N1 9.5%, N2 73%, N3 0%, REM sleep 17% (52 minutes).  Supine REM sleep was not noted.  The longest period of REM sleep was around 4 a.m.  Good REM progression was noted.  AROUSAL DATA:  Total of 30 arousals with an arousal index of 6 events per hour.  Most of these were spontaneous.  RESPIRATORY DATA:  There were total of 14 obstructive apneas, 0 central apneas, 0 mixed apneas, and 0 hypopneas with an apnea-hypopnea index of 2.8 events per hour.  No RERAs were noted.  OXYGEN DATA:  The desaturation index was 0.3 events per hour, and the lowest desaturation was 95%.  CARDIAC DATA:   No arrhythmias were noted.  Low heart rate was 59 beats per minute.  The high heart rate was 103 beats per minute.  MOVEMENT-PARASOMNIA:  No significant limb movements were noted.  DISCUSSION:  Moderate snoring was noted.  He did not have significant respiratory events to meet split night criteria.  IMPRESSION: 1. No evidence of sleep-disordered breathing. 2. No evidence of cardiac arrhythmias, limb movements, or behavioral     disturbance during sleep. 3. Sleep maintenance efficiency was acceptable although the total sleep     time was low.  RECOMMENDATION: 1. Rules of sleep hygiene can be discussed. 2. He should be asked to avoid medications with sedative side effects.     He should be cautioned against driving when sleepy.     Oretha Milch, MD    RVA/MEDQ  D:  08/10/2011 16:18:07  T:  08/10/2011 22:48:31  Job:  [id number]

## 2011-08-27 ENCOUNTER — Ambulatory Visit: Payer: BC Managed Care – PPO | Admitting: Pulmonary Disease

## 2011-09-30 ENCOUNTER — Encounter: Payer: Self-pay | Admitting: Pulmonary Disease

## 2011-09-30 ENCOUNTER — Ambulatory Visit (INDEPENDENT_AMBULATORY_CARE_PROVIDER_SITE_OTHER): Payer: BC Managed Care – PPO | Admitting: Pulmonary Disease

## 2011-09-30 VITALS — BP 126/74 | HR 82 | Temp 98.3°F | Ht 66.0 in | Wt 145.8 lb

## 2011-09-30 DIAGNOSIS — G4700 Insomnia, unspecified: Secondary | ICD-10-CM

## 2011-09-30 MED ORDER — ZOLPIDEM TARTRATE 5 MG PO TABS: 5.0000 mg | ORAL_TABLET | Freq: Every evening | ORAL | Status: DC | PRN

## 2011-09-30 NOTE — Patient Instructions (Addendum)
You do not have obstructive sleep apnea  You have mild insomnia likely related to stress & work OK to use 5mg   melatonin 3 h before bedtime Rx for ambien 5 mg 30 mins prior to bedtime - do not take this later than midnight, report back if you have side effects or hangover effect in the morning

## 2011-09-30 NOTE — Progress Notes (Signed)
  Subjective:    Patient ID: Kevin Farley, male    DOB: 11/25/1955, 56 y.o.   MRN: [id number]  HPI 56 y.o never smoker for FU of insomnia & witnessed apneas  He has had some sleep issues for years but this has become progressive in the last 4-5 months.  He reports frequent nocturnal awakenings. Bedtime is 1100 pm, latency is 10 mins, wakes up around 3 am & can stay awake for as long as 2 hrs - he watches tv in bed or works on the computer. He is then awake again at 0600 but stays in bed until 0730, sleeps on his side x 2 pillows, denies GERD symptoms or restless legs.  Lorazepam as needed at bedtime it is of benefit. A generic Tylenol PM product has also helped, but both of these products caused a "hangover" the next morning.  Wife has noted snoring & witnessed apneas. This is mildly better since sinus surgery in 2005. 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    09/30/2011 No evidence of sleep-disordered breathing.  No evidence of cardiac arrhythmias, limb movements, or behavioral disturbance during sleep.  Sleep maintenance efficiency was acceptable although the total sleep time was low. [place] & did not have problems sleeping. No jetlag symptoms     Review of Systems neg for any significant sore throat, dysphagia, itching, sneezing, nasal congestion or excess/ purulent secretions, fever, chills, sweats, unintended wt loss, pleuritic or exertional cp, hempoptysis, orthopnea pnd or change in chronic leg swelling. Also denies presyncope, palpitations, heartburn, abdominal pain, nausea, vomiting, diarrhea or change in bowel or urinary habits, dysuria,hematuria, rash, arthralgias, visual complaints, headache, numbness weakness or ataxia.     Objective:   Physical Exam  Gen. Pleasant, well-nourished, in no distress ENT - no lesions, no post nasal drip Neck: No JVD, no thyromegaly, no carotid bruits Lungs: no use of accessory muscles, no dullness to percussion, clear  without rales or rhonchi  Cardiovascular: Rhythm regular, heart sounds  normal, no murmurs or gallops, no peripheral edema Musculoskeletal: No deformities, no cyanosis or clubbing        Assessment & Plan:

## 2011-10-01 NOTE — Assessment & Plan Note (Addendum)
He does no have obstructive sleep apnea  He has insomnia -difficulty staying asleep. -worsened by stressors of daily living & work.  We discussed rules of sleep hygiene  Physical activity (walk, treadmill) x 30 mins daily Shower late evening Avoid caffeinated beverages DO not use computer or watch TV after waking up at night - sit up in a chair -listen to music or read until sleepy again   Trial of MELATONIN 5 mg 3 h before bedtime If this does not work, Rx for [employer] 5mg  given - do not use after MN - discussed hangover effects Further FU can be with PCP

## 2011-10-02 ENCOUNTER — Other Ambulatory Visit: Payer: Self-pay | Admitting: Cardiology

## 2011-10-16 ENCOUNTER — Encounter: Payer: Self-pay | Admitting: Internal Medicine

## 2011-10-16 ENCOUNTER — Ambulatory Visit (HOSPITAL_BASED_OUTPATIENT_CLINIC_OR_DEPARTMENT_OTHER)
Admission: RE | Admit: 2011-10-16 | Discharge: 2011-10-16 | Disposition: A | Discharge status: Home / Self Care | Payer: BC Managed Care – PPO | Source: Ambulatory Visit | Attending: Internal Medicine | Admitting: Internal Medicine

## 2011-10-16 ENCOUNTER — Ambulatory Visit (INDEPENDENT_AMBULATORY_CARE_PROVIDER_SITE_OTHER): Payer: BC Managed Care – PPO | Admitting: Internal Medicine

## 2011-10-16 VITALS — BP 116/70 | HR 82 | Temp 98.4°F | Wt 145.0 lb

## 2011-10-16 DIAGNOSIS — R61 Generalized hyperhidrosis: Secondary | ICD-10-CM

## 2011-10-16 DIAGNOSIS — R0609 Other forms of dyspnea: Secondary | ICD-10-CM

## 2011-10-16 DIAGNOSIS — R911 Solitary pulmonary nodule: Secondary | ICD-10-CM | POA: Insufficient documentation

## 2011-10-16 DIAGNOSIS — R0789 Other chest pain: Secondary | ICD-10-CM

## 2011-10-16 DIAGNOSIS — R079 Chest pain, unspecified: Secondary | ICD-10-CM | POA: Insufficient documentation

## 2011-10-16 DIAGNOSIS — R0600 Dyspnea, unspecified: Secondary | ICD-10-CM

## 2011-10-16 DIAGNOSIS — R7301 Impaired fasting glucose: Secondary | ICD-10-CM

## 2011-10-16 DIAGNOSIS — R0989 Other specified symptoms and signs involving the circulatory and respiratory systems: Secondary | ICD-10-CM

## 2011-10-16 DIAGNOSIS — R071 Chest pain on breathing: Secondary | ICD-10-CM

## 2011-10-16 LAB — CK TOTAL AND CKMB (NOT AT ARMC)
CK, MB: 1.5 ng/mL (ref 0.3–4.0)
Relative Index: 1 (ref 0.0–2.5)
Total CK: 145 U/L (ref 7–232)

## 2011-10-16 LAB — TROPONIN I: Troponin I: <0.01 ng/mL (ref <0.06)

## 2011-10-16 LAB — D-DIMER, QUANTITATIVE: D-Dimer, Quant: 0.27 ug/mL-FEU (ref 0.00–0.48)

## 2011-10-16 NOTE — Progress Notes (Signed)
  Subjective:    Patient ID: Kevin Farley, male    DOB: 1956-01-30, 56 y.o.   MRN: [id number]  HPI He has noted nocturnal sweating chiefly on the left side of his body for 2 weeks. He's also had some exertional dyspnea during this period. He denies fever, chills,weight loss, cough, sputum, or hemoptysis.  He describes intermittent left anterior nonexertional, throbbing chest pain for 2 weeks  as well as dull intrascapular area discomfort for the same period  Has had some cramping in his  lower legs without claudication or edema.  There is no personal or family history of asthma, emphysema, or tuberculosis.    Review of Systems He denies associated nasal congestion/obstruction; nasal purulence; facial pain; anosmia; fatigue; fever; headache; halitosis; earache and dental pain.    His wife checked his fasting sugar today;it  was 134. There is a strong family history of diabetes.     Objective:   Physical Exam Gen.: Healthy and well-nourished in appearance. Alert, appropriate and cooperative throughout exam.  Eyes: No corneal or conjunctival inflammation noted. Mouth: Oral mucosa and oropharynx reveal no lesions or exudates. Teeth in good repair. Neck: No deformities, masses, or tenderness noted. Thyroid normal. Lungs: Normal respiratory effort; chest expands symmetrically. Lungs are clear to auscultation without rales, wheezes, or increased work of breathing. Heart: Normal rate and rhythm. Normal S1 and S2. No gallop, click, or rub. S4 w/o murmur. Abdomen: Bowel sounds normal; abdomen soft and nontender. No masses, organomegaly or hernias noted.                                                                                    Musculoskeletal/extremities: No deformity or scoliosis noted of  the thoracic or lumbar spine. No clubbing, cyanosis, edema, or deformity noted. Homan's negative .Tone & strength  normal.Joints normal. Nail health  good. Vascular: Carotid, radial artery, dorsalis pedis  and  posterior tibial pulses are full and equal. No bruits present. Neurologic: Alert and oriented x3.  Skin: Intact without suspicious lesions or rashes. Lymph: No cervical, axillary lymphadenopathy present. Psych: Mood and affect are normal. Normally interactive                                                                                         Assessment & Plan:  #1 nocturnal diaphoresis without associated constitutional  or pulmonary symptoms  #2 atypical left anterior chest pain and interscapular pain. EKG reveals minor early repolarization changes; it is normal with no evidence of ischemia.  #3 dyspnea on exertion  #4 fasting hyperglycemia with strong family history of diabetes  Plan: Cardiac enzymes and d-dimer will be collected. Chest x-ray will be performed. He will need a CT angiogram were the d-dimer abnormal. Labs will be transmitted through My Chart to facilitate immediate communication.

## 2011-10-16 NOTE — Patient Instructions (Addendum)
To ER if pain persists or is associaled with Warning Signs as discussed. Please try to go on My Chart ASAP to allow me to release the results directly to you.

## 2011-10-17 LAB — CBC WITH DIFFERENTIAL/PLATELET
Basophils Absolute: 0 10*3/uL (ref 0.0–0.1)
Basophils Relative: 0.7 % (ref 0.0–3.0)
Eosinophils Absolute: 0.1 10*3/uL (ref 0.0–0.7)
Eosinophils Relative: 0.9 % (ref 0.0–5.0)
HCT: 40 % (ref 39.0–52.0)
Hemoglobin: 13.2 g/dL (ref 13.0–17.0)
Lymphocytes Relative: 24.8 % (ref 12.0–46.0)
Lymphs Abs: 1.5 10*3/uL (ref 0.7–4.0)
MCHC: 33.1 g/dL (ref 30.0–36.0)
MCV: 84 fl (ref 78.0–100.0)
Monocytes Absolute: 0.5 10*3/uL (ref 0.1–1.0)
Monocytes Relative: 8.1 % (ref 3.0–12.0)
Neutro Abs: 4 10*3/uL (ref 1.4–7.7)
Neutrophils Relative %: 65.5 % (ref 43.0–77.0)
Platelets: 171 10*3/uL (ref 150.0–400.0)
RBC: 4.76 Mil/uL (ref 4.22–5.81)
RDW: 13.8 % (ref 11.5–14.6)
WBC: 6.1 10*3/uL (ref 4.5–10.5)

## 2011-10-17 LAB — HEMOGLOBIN A1C: Hgb A1c MFr Bld: 6 % (ref 4.6–6.5)

## 2011-10-18 ENCOUNTER — Telehealth: Payer: Self-pay

## 2011-10-18 NOTE — Telephone Encounter (Signed)
See printed labs; all are normal. If he Is taking Dexilant & still having chest symptoms ; I recommend a GI reevaluation.      I left detailed message on voicemail informing patient of lab results and above information. Copy of report to be mailed to patient, patient to call if he has questions or concerns. CBeatty

## 2011-10-18 NOTE — Telephone Encounter (Signed)
Message copied by Maurice Small on Fri Oct 18, 2011  9:44 AM ------      Message from: Pecola Lawless      Created: Thu Oct 17, 2011  6:25 PM       I do not see lab results in My Chart; but the chest x-ray report was & it was released directly to you. I did find them in the chart file as  signed off inadvertently; I apologize for this. All studies were normal with no evidence of cardiac injury. The screening test for pulmonary emboli was also normal. A1c was 6%, in the nondiabetic range. Complete blood count revealed no anemia, elevated white count, or abnormal cells. A hard copy will be mailed for your home file

## 2011-11-08 ENCOUNTER — Encounter: Payer: Self-pay | Admitting: Internal Medicine

## 2011-11-08 ENCOUNTER — Ambulatory Visit (INDEPENDENT_AMBULATORY_CARE_PROVIDER_SITE_OTHER): Payer: BC Managed Care – PPO | Admitting: Internal Medicine

## 2011-11-08 VITALS — BP 116/70 | HR 77 | Temp 98.2°F | Resp 12 | Ht 67.0 in | Wt 144.0 lb

## 2011-11-08 DIAGNOSIS — Z0000 Encounter for general adult medical examination without abnormal findings: Secondary | ICD-10-CM

## 2011-11-08 DIAGNOSIS — G479 Sleep disorder, unspecified: Secondary | ICD-10-CM

## 2011-11-08 DIAGNOSIS — Z23 Encounter for immunization: Secondary | ICD-10-CM

## 2011-11-08 LAB — LIPID PANEL
Cholesterol: 265 mg/dL — ABNORMAL HIGH (ref 0–200)
HDL: 68.6 mg/dL (ref >39.00)
Total CHOL/HDL Ratio: 4
Triglycerides: 239 mg/dL — ABNORMAL HIGH (ref 0.0–149.0)
VLDL: 47.8 mg/dL — ABNORMAL HIGH (ref 0.0–40.0)

## 2011-11-08 LAB — LDL CHOLESTEROL, DIRECT: Direct LDL: 166.9 mg/dL

## 2011-11-08 LAB — HEPATIC FUNCTION PANEL
ALT: 43 U/L (ref 0–53)
AST: 27 U/L (ref 0–37)
Albumin: 4.2 g/dL (ref 3.5–5.2)
Alkaline Phosphatase: 56 U/L (ref 39–117)
Bilirubin, Direct: 0.1 mg/dL (ref 0.0–0.3)
Total Bilirubin: 0.6 mg/dL (ref 0.3–1.2)
Total Protein: 7 g/dL (ref 6.0–8.3)

## 2011-11-08 LAB — TSH: TSH: 2.5 u[IU]/mL (ref 0.35–5.50)

## 2011-11-08 LAB — PSA: PSA: 0.76 ng/mL (ref 0.10–4.00)

## 2011-11-08 MED ORDER — CLONAZEPAM 0.5 MG PO TABS: ORAL_TABLET | ORAL | Status: DC

## 2011-11-08 NOTE — Patient Instructions (Addendum)
Preventive Health Care: Exercise at least 30-45 minutes a day,  3-4 days a week.  Eat a low-fat diet with lots of fruits and vegetables, up to 7-9 servings per day.  Consume less than 40 grams of sugar per day from foods & drinks with High Fructose Corn Sugar as #1,2,3 or # 4 on label. Health Care Power of Attorney & Living Will. Complete if not in place ; these place you in charge of your health care decisions. To prevent sleep dysfunction follow these instructions for sleep hygiene. Do not read, watch TV, or eat in bed. Do not get into bed until you are ready to turn off the light &  to go to sleep. Do not ingest stimulants ( decongestants, diet pills, nicotine, caffeine) after the evening meal. Do not take Ambien  if  Clonazepam taken If you activate My Chart; the results can be released to you as soon as they populate from the lab. If you choose not to use this program; the labs have to be reviewed, copied & mailed   causing a delay in getting the results to you.

## 2011-11-08 NOTE — Progress Notes (Signed)
  Subjective:    Patient ID: Kevin Farley, male    DOB: 01-01-1956, 56 y.o.   MRN: [id number]  HPI  Kevin Farley is here for a physical;  acute issues include sleep disorder & night sweats.      Review of Systems He walks 10-15 minutes daily. He denies associated chest pain, dyspnea, palpitations, or claudication. His diet is low sugar with increased fruits and vegetables. Past medical history/family history/social history were all reviewed and updated. Pertinent data include strong family history of coronary artery disease and diabetes with renal complications.    Insomnia Onset:> 12 mos Pattern: Difficulty going to sleep:no Frequent awakening:no Early awakening:yes , 3 -4 am Nightmares:no Abnormal leg movement:no Snoring:no Apnea:tested , low AHI; Dr Vassie Loll Risk factors/sleep hygiene: Stimulants:no Alcohol intake: Reading, watching TV, eat in HKV:[id number] TV Daytime naps:no Stress/anxiety:business stress Work/travel factors:occasionally Impact: Daytime hypersomnolence: intermittently Motor vehicle accident/motor dysfunction:no Treatment to date/efficacy: Ambien helped         Objective:   Physical Exam Gen.: Healthy and well-nourished in appearance. Alert, appropriate and cooperative throughout exam. Head: Normocephalic without obvious abnormalities;  no alopecia  Eyes: No corneal or conjunctival inflammation noted. Decreased OS light reflex with decreased visual acuity. Extraocular motion intact.  Ears: External  ear exam reveals no significant lesions or deformities. Canals clear .TMs normal. Hearing is grossly normal bilaterally. Nose: External nasal exam reveals no deformity or inflammation. Nasal mucosa are pink and moist. No lesions or exudates noted.   Mouth: Oral mucosa and oropharynx reveal no lesions or exudates. Teeth in good repair. Neck: No deformities, masses, or tenderness noted. Range of motion & Thyroid normal. Lungs: Normal respiratory effort; chest expands  symmetrically. Lungs are clear to auscultation without rales, wheezes, or increased work of breathing. Heart: Normal rate and rhythm. Normal S1 and S2. No gallop, click, or rub. No murmur. Abdomen: Bowel sounds normal; abdomen soft and nontender. No masses, organomegaly or hernias noted. Genitalia/DRE: Genitalia normal except for left varices. Prostate reveals slight  asymmetry; left lobe upper limits of normal; right lobe small. No nodularity or induration.              Musculoskeletal/extremities: No deformity or scoliosis noted of  the thoracic or lumbar spine. No clubbing, cyanosis, edema, or deformity noted. Range of motion  normal .Tone & strength  normal.Joints normal. Nail health  good. Vascular: Carotid, radial artery, dorsalis pedis and  posterior tibial pulses are full and equal. No bruits present. Neurologic: Alert and oriented x3. Deep tendon reflexes symmetrical and normal.          Skin: Intact without suspicious lesions or rashes. Lymph: No cervical, axillary, or inguinal lymphadenopathy present. Psych: Mood and affect are normal. Normally interactive                                                                                        Assessment & Plan:  #1 comprehensive physical exam; no acute findings #2 see Problem List with Assessments & Recommendations Plan: see Orders

## 2011-11-12 LAB — VITAMIN D 1,25 DIHYDROXY
Vitamin D 1, 25 (OH)2 Total: 79 pg/mL — ABNORMAL HIGH (ref 18–72)
Vitamin D2 1, 25 (OH)2: <8 pg/mL
Vitamin D3 1, 25 (OH)2: 79 pg/mL

## 2011-11-14 ENCOUNTER — Telehealth: Payer: Self-pay

## 2011-11-14 NOTE — Telephone Encounter (Signed)
Spoke with pt advise pt wife no lab results in yet we will contact either by phone or mail when results in. Pt stated understanding.    MW

## 2011-11-15 ENCOUNTER — Telehealth: Payer: Self-pay | Admitting: Internal Medicine

## 2011-11-15 NOTE — Telephone Encounter (Signed)
Labs results were sent through mychart on 11/10/11.   I called patient and left message informing patient labs were responded to in Mychart, I do not know why he is unable to view labs. Copy of labs mailed to patient

## 2011-11-15 NOTE — Telephone Encounter (Signed)
Pt would like my chart labs loaded into My chart- but would like someone to call her- she called yesterday, amy

## 2012-01-10 ENCOUNTER — Other Ambulatory Visit: Payer: Self-pay | Admitting: Internal Medicine

## 2012-01-10 NOTE — Telephone Encounter (Signed)
I did #30 no refills

## 2012-01-10 NOTE — Telephone Encounter (Signed)
Ok to refill 

## 2012-01-10 NOTE — Telephone Encounter (Signed)
Faxed refill

## 2012-02-10 ENCOUNTER — Other Ambulatory Visit: Payer: Self-pay | Admitting: Internal Medicine

## 2012-03-02 ENCOUNTER — Encounter: Payer: Self-pay | Admitting: Gastroenterology

## 2012-05-07 ENCOUNTER — Ambulatory Visit (INDEPENDENT_AMBULATORY_CARE_PROVIDER_SITE_OTHER): Payer: Self-pay | Admitting: General Surgery

## 2012-05-12 ENCOUNTER — Encounter (INDEPENDENT_AMBULATORY_CARE_PROVIDER_SITE_OTHER): Payer: Self-pay | Admitting: General Surgery

## 2012-05-12 ENCOUNTER — Ambulatory Visit (INDEPENDENT_AMBULATORY_CARE_PROVIDER_SITE_OTHER): Payer: BC Managed Care – PPO | Admitting: General Surgery

## 2012-05-12 VITALS — BP 118/80 | HR 72 | Temp 98.2°F | Resp 18 | Ht 66.0 in | Wt 147.8 lb

## 2012-05-12 DIAGNOSIS — R1032 Left lower quadrant pain: Secondary | ICD-10-CM

## 2012-05-12 NOTE — Progress Notes (Signed)
Patient ID: Kevin Farley, male   DOB: [birth year], 56 y.o.   MRN: [id number]  No chief complaint on file.   HPI Kevin Farley is a 57 y.o. male.   HPI  He is referred by Dr. Annabell Farley for evaluation of a left inguinal hernia.  For at least 2 years, He has been having some pain in the left lower quadrant and groin regions as well as left testicle. It seems to be worse at night. He can have the discomfort with intercourse. He has not noticed a left inguinal bulge. He saw Dr. Annabell Farley who thought he had a left inguinal hernia on exam and sent him over here.  No family history of hernia. No difficulty with urination or having a bowel movement. No chronic cough.  Past Medical History  Diagnosis Date  . Hyperlipidemia   . Allergic rhinitis   . Supraspinatus tendon tear   . Colon polyps     adenomatous 03/2009  . GERD (gastroesophageal reflux disease)   . Family history of ischemic heart disease   . Other abnormal glucose     A1c 6% in 06/2009    Past Surgical History  Procedure Laterality Date  . Corneal transplant      Dr Dagoberto Ligas  . Back surgery      Dr Channing Mutters, [place]  . Nasal bone surgery    . Cardic catherization  2005    negative; Dr Marni Griffon  . Colon polyps  03/2009    Dr.Jacobs repeat due 2014  . Upper gastrointestinal endoscopy  2011    mild gastritis; Dr Christella Hartigan    Family History  Problem Relation Age of Onset  . Hypertension Mother   . Diabetes Mother   . Stroke Mother     in 55s  . Diabetes Father   . Hypertension Father   . Heart attack Father     had open heart surgery  . Diabetes Brother   . Heart attack Brother      X 2 had MI in 22s  . Stroke Brother     in 60s  . Kidney failure Brother   . Hypertension Brother   . Diabetes Sister   . Hypertension Sister   . COPD Neg Hx   . Asthma Neg Hx   . Cancer Neg Hx   . Kidney failure Brother     renal transplant  . Diabetes Brother     Social History History  Substance Use Topics  . Smoking status: Never Smoker   . Smokeless  tobacco: Never Used  . Alcohol Use: 8.4 oz/week    14 Glasses of wine per week     Comment: two glasses of wine with dinner every night    Allergies  Allergen Reactions  . Aspirin     REACTION: hives  . Ezetimibe-Simvastatin     REACTION: myalgia    Current Outpatient Prescriptions  Medication Sig Dispense Refill  . atorvastatin (LIPITOR) 80 MG tablet Take 80 mg by mouth. 1/2 by mouth daily      . Cholecalciferol (VITAMIN D3) 1000 UNITS CAPS Take by mouth daily.      . clonazePAM (KLONOPIN) 0.5 MG tablet One at bedtime as needed for sleep dysfunction  30 tablet  1  . DEXILANT 60 MG capsule TAKE ONE CAPSULE BY MOUTH DAILY  30 capsule  5  . ezetimibe (ZETIA) 10 MG tablet Take 10 mg by mouth daily.      . Flaxseed, Linseed, (FLAX SEED OIL PO) Take by  mouth.      . fluticasone (FLONASE) 50 MCG/ACT nasal spray PLACE 2 SPRAYS INTO THE NOSE DAILY. AS NEEDED  16 g  3  . LORazepam (ATIVAN) 0.5 MG tablet TAKE 1/2 TABLET EVERY 8-12 HOURS AS NEEDED  30 tablet  0  . zolpidem (AMBIEN) 5 MG tablet Take 1 tablet (5 mg total) by mouth at bedtime as needed for sleep.  30 tablet  0  . [DISCONTINUED] Lansoprazole (PREVACID PO) Take by mouth daily.        . [DISCONTINUED] omeprazole (PRILOSEC) 40 MG capsule Take 1 capsule (40 mg total) by mouth daily.  180 capsule  1   No current facility-administered medications for this visit.    Review of Systems Review of Systems  Constitutional: Negative.   Respiratory: Negative.   Cardiovascular: Negative.   Gastrointestinal: Positive for abdominal pain (LLQ). Negative for vomiting and constipation.    Blood pressure 118/80, pulse 72, temperature 98.2 F (36.8 C), temperature source Temporal, resp. rate 18, height 5\' 6"  (1.676 m), weight 147 lb 12.8 oz (67.042 kg).  Physical Exam Physical Exam  Constitutional: He appears well-developed and well-nourished. No distress.  HENT:  Head: Normocephalic and atraumatic.  Abdominal: Soft. He exhibits no  distension and no mass. There is no tenderness.  Very small reducible umbilical hernia.  Genitourinary:  Right inguinal floor it is solid. No testicular masses. Left inguinal floor does not demonstrate any bulges with cough on my exam today.    Data Reviewed .  Assessment    Left lower quadrant and left testicular pain. I cannot appreciate a hernia today    Plan    Resume usual activities. Return visit for reexamination in 6 weeks. If I still cannot appreciate a hernia on exam and he still having the symptoms then we'll order a CT scan.        , J 05/12/2012, 3:33 PM

## 2012-05-12 NOTE — Patient Instructions (Signed)
Continue all physical activity. I will see you in about 6 weeks and reevaluate you.

## 2012-06-25 ENCOUNTER — Ambulatory Visit (INDEPENDENT_AMBULATORY_CARE_PROVIDER_SITE_OTHER): Payer: BC Managed Care – PPO | Admitting: General Surgery

## 2012-06-25 ENCOUNTER — Encounter (INDEPENDENT_AMBULATORY_CARE_PROVIDER_SITE_OTHER): Payer: BC Managed Care – PPO | Admitting: General Surgery

## 2012-06-25 VITALS — BP 128/78 | HR 80 | Temp 97.5°F | Resp 18 | Ht 66.0 in | Wt 145.0 lb

## 2012-06-25 DIAGNOSIS — S76219A Strain of adductor muscle, fascia and tendon of unspecified thigh, initial encounter: Secondary | ICD-10-CM | POA: Insufficient documentation

## 2012-06-25 DIAGNOSIS — R1032 Left lower quadrant pain: Secondary | ICD-10-CM

## 2012-06-25 DIAGNOSIS — S76219D Strain of adductor muscle, fascia and tendon of unspecified thigh, subsequent encounter: Secondary | ICD-10-CM

## 2012-06-25 NOTE — Progress Notes (Signed)
Subjective:     Patient ID: Kevin Farley, male   DOB: [birth year], 57 y.o.   MRN: [id number]  HPIHe returns to have me reevaluate his left groin pain. He states it is getting better. No swelling in the area. Last time I saw him, I could not appreciate a left inguinal hernia.   Review of Systems     Objective:   Physical Exam Gen-he looks well.  GU- Both groin areas are flat. I am unable to palpate a left inguinal hernia even when he does a Valsalva maneuver.    Assessment:     Left groin strain appears to be improving. No evidence of left inguinal hernia.     Plan:     Avoid activities that cause discomfort to the area. I told him this could take a number of weeks to get completely better. Return visit as needed.

## 2012-06-25 NOTE — Patient Instructions (Signed)
Avoid activities that cause you discomfort in the left groin area.

## 2012-07-07 ENCOUNTER — Other Ambulatory Visit: Payer: Self-pay | Admitting: Internal Medicine

## 2012-07-08 NOTE — Telephone Encounter (Signed)
Left message on voicemail for patient to return call when available, reason for call: Patient needs to stop by the office to sign a contract and pick-up rx, patient to call and let me know when he will stop by

## 2012-07-09 NOTE — Telephone Encounter (Signed)
Patient aware Controlled Substance Contract to be sign and rx to be picked up   

## 2012-08-04 ENCOUNTER — Telehealth: Payer: Self-pay | Admitting: Internal Medicine

## 2012-08-04 ENCOUNTER — Encounter: Payer: Self-pay | Admitting: Lab

## 2012-08-04 NOTE — Telephone Encounter (Signed)
Patient's wife is calling to see if it is time for the patient to come in to have lab work done. Please advise.

## 2012-08-04 NOTE — Telephone Encounter (Signed)
Left message to call office to advise Pt is due for repeat fasting labs after 10 weeks oer labs done 11-08-11. Labs: lipids, AST,ALT,CK.Diagnoses /Codes:272.4,995.20,V17.3.

## 2012-08-05 ENCOUNTER — Other Ambulatory Visit (INDEPENDENT_AMBULATORY_CARE_PROVIDER_SITE_OTHER): Payer: BC Managed Care – PPO

## 2012-08-05 DIAGNOSIS — Z8249 Family history of ischemic heart disease and other diseases of the circulatory system: Secondary | ICD-10-CM

## 2012-08-05 DIAGNOSIS — E785 Hyperlipidemia, unspecified: Secondary | ICD-10-CM

## 2012-08-05 DIAGNOSIS — T887XXA Unspecified adverse effect of drug or medicament, initial encounter: Secondary | ICD-10-CM

## 2012-08-05 LAB — LIPID PANEL
Cholesterol: 197 mg/dL (ref 0–200)
HDL: 64.7 mg/dL (ref >39.00)
LDL Cholesterol: 106 mg/dL — ABNORMAL HIGH (ref 0–99)
Total CHOL/HDL Ratio: 3
Triglycerides: 132 mg/dL (ref 0.0–149.0)
VLDL: 26.4 mg/dL (ref 0.0–40.0)

## 2012-08-05 LAB — AST: AST: 39 U/L — ABNORMAL HIGH (ref 0–37)

## 2012-08-05 LAB — ALT: ALT: 76 U/L — ABNORMAL HIGH (ref 0–53)

## 2012-08-05 LAB — CK: Total CK: 142 U/L (ref 7–232)

## 2012-08-05 NOTE — Telephone Encounter (Signed)
Pt had labs done today.

## 2012-08-19 ENCOUNTER — Encounter: Payer: Self-pay | Admitting: Internal Medicine

## 2012-09-08 ENCOUNTER — Encounter: Payer: Self-pay | Admitting: Gastroenterology

## 2012-10-21 ENCOUNTER — Encounter: Payer: Self-pay | Admitting: Internal Medicine

## 2012-10-21 ENCOUNTER — Ambulatory Visit (INDEPENDENT_AMBULATORY_CARE_PROVIDER_SITE_OTHER): Payer: BC Managed Care – PPO | Admitting: Internal Medicine

## 2012-10-21 VITALS — BP 126/78 | HR 87 | Temp 98.7°F | Resp 12 | Wt 149.2 lb

## 2012-10-21 DIAGNOSIS — K219 Gastro-esophageal reflux disease without esophagitis: Secondary | ICD-10-CM

## 2012-10-21 DIAGNOSIS — Z23 Encounter for immunization: Secondary | ICD-10-CM

## 2012-10-21 MED ORDER — DEXLANSOPRAZOLE 60 MG PO CPDR: 60.0000 mg | DELAYED_RELEASE_CAPSULE | Freq: Every day | ORAL | Status: DC

## 2012-10-21 NOTE — Patient Instructions (Addendum)
Reflux of gastric acid may be asymptomatic as this may occur mainly during sleep.The triggers for reflux  include stress; the "aspirin family" ; alcohol; peppermint; and caffeine (coffee, tea, cola, and chocolate). The aspirin family would include aspirin and the nonsteroidal agents such as ibuprofen &  Naproxen. Tylenol would not cause reflux. If having symptoms ; food & drink should be avoided for @ least 2 hours before going to bed.  Mix Eucerin 1 part to Cort Aid 1 part and apply  Twice a day to itchy area of skin as needed .

## 2012-10-21 NOTE — Progress Notes (Signed)
  Subjective:    Patient ID: Kevin Farley, male    DOB: Jul 16, 1955, 57 y.o.   MRN: [id number]  HPI  He had an exacerbation of his reflux symptoms over the last 6 weeks. This is manifested as burning and some burping, particularly postprandially. Triggers include spicy foods.  He had been on Dexilant and but stopped this several months ago as he was stable. A Gaia herbal product was initially of benefit but has been less so recently.  He is not ingesting aspirin Advil or other nonsteroidals. He will have 1-2 cups of coffee or tea a day. He has 1-2 glasses of alcohol night. He does not smoke and is not ingesting peppermint.  He has a past history of reflux & colon polyps and is followed by Dr. Christella Hartigan.     Review of Systems He denies weight loss, fever, chills, sweats, dysuria, pyuria, or hematuria. There's been no rash or temperature or color change in the area of the discomfort. He denies any radicular component to this.  He has some itching of the soles of his feet. He questions whether this might be related to water exposure in the garden.  He is having no exertional chest pain. He requested to review his lipids and his present statin therapy to assess the optimal intervention    Objective:   Physical Exam General appearance is one of good health and nourishment w/o distress.  Eyes: No conjunctival inflammation or scleral icterus is present.  Oral exam: Dental hygiene is good; lips and gums are healthy appearing.There is no oropharyngeal erythema or exudate noted.   Heart:  Normal rate and regular rhythm. S1 and S2 normal without gallop, murmur, click, rub or other extra sounds     Lungs:Chest clear to auscultation; no wheezes, rhonchi,rales ,or rubs present.No increased work of breathing.   Abdomen: bowel sounds normal, soft and non-tender without masses, organomegaly or hernias noted.  No guarding or rebound   Skin:Warm & dry.  Intact without suspicious lesions or rashes ; no  jaundice or tenting. There is some dryness over the soles of the feet. The nails reveal no fungal changes. Hair growth is excellent over the dorsum of the feet.  Lymphatic: No lymphadenopathy is noted about the head, neck, axilla            Assessment & Plan:  #1 symptomatic reflux  #2 dryness of the soles of the feet with clinical drying #3 dyslipidemia; there is been dramatic improvement in his LDL. This risk reduction was discussed with him and his wife. No change will be recommended in his present dual therapy. His ezetimibe is helping prevent cholesterol absorption which may be a significant component in his dyslipidemia. Plan see orders and recommendations

## 2012-11-03 ENCOUNTER — Ambulatory Visit (INDEPENDENT_AMBULATORY_CARE_PROVIDER_SITE_OTHER): Payer: BC Managed Care – PPO | Admitting: Family

## 2012-11-03 ENCOUNTER — Encounter: Payer: Self-pay | Admitting: Family

## 2012-11-03 VITALS — BP 120/84 | HR 89 | Temp 97.8°F | Resp 16 | Ht 67.0 in | Wt 148.0 lb

## 2012-11-03 DIAGNOSIS — H6690 Otitis media, unspecified, unspecified ear: Secondary | ICD-10-CM | POA: Insufficient documentation

## 2012-11-03 DIAGNOSIS — H6692 Otitis media, unspecified, left ear: Secondary | ICD-10-CM

## 2012-11-03 MED ORDER — BENZONATATE 100 MG PO CAPS: 100.0000 mg | ORAL_CAPSULE | Freq: Three times a day (TID) | ORAL | Status: DC | PRN

## 2012-11-03 MED ORDER — AMOXICILLIN 500 MG PO CAPS: 500.0000 mg | ORAL_CAPSULE | Freq: Three times a day (TID) | ORAL | Status: DC

## 2012-11-03 NOTE — Patient Instructions (Addendum)
Please call if symptoms worsen or if not improved in 2-3 days.   

## 2012-11-03 NOTE — Progress Notes (Signed)
Subjective:    Patient ID: Kevin Farley, male    DOB: 11/22/1955, 57 y.o.   MRN: [id number]  HPI  Kevin Farley is a 57 yr old male who presents today with chief complaint of sore throat. Reports that symptoms started approximately 3 days ago after he took a plane to [place].  Cough is productive of green phlegm.  + heat intolerance though no documented fevers.  He reports mild myalgia across his back. Wife is starting to get similar symptoms.     Review of Systems    see HPI  Past Medical History  Diagnosis Date  . Hyperlipidemia   . Allergic rhinitis   . Supraspinatus tendon tear   . Colon polyps     adenomatous 03/2009  . GERD (gastroesophageal reflux disease)   . Family history of ischemic heart disease   . Other abnormal glucose     A1c 6% in 06/2009    History   Social History  . Marital Status: Married    Spouse Name: N/A    Number of Children: 1  . Years of Education: N/A   Occupational History  . manager of hotels    . OWNER    Social History Main Topics  . Smoking status: Never Smoker   . Smokeless tobacco: Never Used  . Alcohol Use: 8.4 oz/week    14 Glasses of wine per week     Comment: two glasses of wine with dinner every night  . Drug Use: No  . Sexual Activity: Not on file   Other Topics Concern  . Not on file   Social History Narrative   Never smoked   Regular diet   Does exercise 2x week   Coffee 1 daily    Past Surgical History  Procedure Laterality Date  . Corneal transplant      Dr Dagoberto Ligas  . Back surgery      Dr Channing Mutters, [place]  . Nasal bone surgery    . Cardic catherization  2005    negative; Dr Marni Griffon  . Colon polyps  03/2009    Dr.Jacobs repeat due 2014  . Upper gastrointestinal endoscopy  2011    mild gastritis; Dr Christella Hartigan    Family History  Problem Relation Age of Onset  . Hypertension Mother   . Diabetes Mother   . Stroke Mother     in 8s  . Diabetes Father   . Hypertension Father   . Heart attack Father     had open  heart surgery  . Diabetes Brother   . Heart attack Brother      X 2 had MI in 44s  . Stroke Brother     in 52s  . Kidney failure Brother   . Hypertension Brother   . Diabetes Sister   . Hypertension Sister   . COPD Neg Hx   . Asthma Neg Hx   . Cancer Neg Hx   . Kidney failure Brother     renal transplant  . Diabetes Brother     Allergies  Allergen Reactions  . Aspirin     REACTION: hives  . Ezetimibe-Simvastatin     REACTION: myalgia    Current Outpatient Prescriptions on File Prior to Visit  Medication Sig Dispense Refill  . atorvastatin (LIPITOR) 40 MG tablet Take 40 mg by mouth daily.      . Cholecalciferol (VITAMIN D3) 1000 UNITS CAPS Take by mouth daily.      . Coenzyme Q10 (CO Q-10) 100 MG  CAPS Take 1 capsule by mouth daily.      Marland Kitchen dexlansoprazole (DEXILANT) 60 MG capsule Take 1 capsule (60 mg total) by mouth daily.  30 capsule  5  . ezetimibe (ZETIA) 10 MG tablet Take 10 mg by mouth daily.      . Flaxseed, Linseed, (FLAX SEED OIL PO) Take by mouth.      . fluticasone (FLONASE) 50 MCG/ACT nasal spray PLACE 2 SPRAYS INTO THE NOSE DAILY. AS NEEDED  16 g  3  . Multiple Vitamin (MULTIVITAMIN) tablet Take 1 tablet by mouth daily.      . Omega-3 Fatty Acids (FISH OIL) 500 MG CAPS Take 1 capsule by mouth daily.      . [DISCONTINUED] Lansoprazole (PREVACID PO) Take by mouth daily.        . [DISCONTINUED] omeprazole (PRILOSEC) 40 MG capsule Take 1 capsule (40 mg total) by mouth daily.  180 capsule  1   No current facility-administered medications on file prior to visit.    BP 120/84  Pulse 89  Temp(Src) 97.8 F (36.6 C) (Oral)  Resp 16  Ht 5\' 7"  (1.702 m)  Wt 148 lb (67.132 kg)  BMI 23.17 kg/m2  SpO2 98%    Objective:   Physical Exam  Constitutional: He is oriented to person, place, and time. He appears well-developed and well-nourished. No distress.  HENT:  Head: Normocephalic and atraumatic.  Mouth/Throat: Posterior oropharyngeal erythema present. No  oropharyngeal exudate or posterior oropharyngeal edema.  R TM small perforation noted L TM + erythema, small perforation noted  Cardiovascular: Normal rate and regular rhythm.   No murmur heard. Pulmonary/Chest: Effort normal and breath sounds normal. No respiratory distress. He has no wheezes. He has no rales. He exhibits no tenderness.  Lymphadenopathy:    He has cervical adenopathy.  Neurological: He is alert and oriented to person, place, and time.  Psychiatric: He has a normal mood and affect. His behavior is normal. Judgment and thought content normal.          Assessment & Plan:

## 2012-11-03 NOTE — Assessment & Plan Note (Signed)
Pt with early left otitis media. He has bilateral TM perforations which are small and appear chronic. Pt states he is aware of this and denies any hearing deficits. Will rx with amoxicillin. Add tessalon prn cough.  Follow up if symptoms worsen, or if symptoms do not improve in 2-3 days.

## 2012-11-05 ENCOUNTER — Other Ambulatory Visit: Payer: Self-pay | Admitting: Internal Medicine

## 2012-11-05 NOTE — Telephone Encounter (Signed)
Patients wife returned phone call. Best # [phone]

## 2012-11-05 NOTE — Telephone Encounter (Signed)
Left message for pt's wife to return my call with reason of request for syrup.

## 2012-11-05 NOTE — Telephone Encounter (Signed)
Patients wife called in stating that patient was prescribed some cough pills at last visit but would also like cough syrup. [facility] [address]

## 2012-11-06 NOTE — Telephone Encounter (Signed)
Left message for pt's wife to return my call.  

## 2012-11-09 MED ORDER — HYDROCOD POLST-CHLORPHEN POLST 10-8 MG/5ML PO LQCR: 5.0000 mL | Freq: Every evening | ORAL | Status: DC | PRN

## 2012-11-09 NOTE — Telephone Encounter (Signed)
OK to call in tussionex as below.  Also, I recommend that patient be re-evaluated in the office for possible pneumonia since cough has not improved.

## 2012-11-09 NOTE — Telephone Encounter (Signed)
Spoke with pt's wife. She states pt has taken almost all of the tessalon perles and the don't really seem to be helping. He is requesting cough syrup to help him sleep at night. Pt is still taking antibiotic. Please advise.

## 2012-11-09 NOTE — Telephone Encounter (Signed)
Rx called to pharmacy voicemail. Notified pt's wife and she states she will relay recommendation for re-evaluation to pt.

## 2012-11-26 ENCOUNTER — Telehealth: Payer: Self-pay

## 2012-11-26 NOTE — Telephone Encounter (Signed)
Medication List and allergies: done  [facility] [address] [facility]  Immunizations due: UTD  A/P:  LAST: PSA: UTD 10/2011 Seen by Dr Abbey Chatters 05/2012  CCS: UTD 03/2009   DM:  Due 11/2011 Eye Exam: na  HTN: Due 11/2011 Lipids: Due 11/2011  Recent family history or surgical procedures: none  To Discuss with Provider: Hemorrhoids  Eyebrows turning white

## 2012-11-30 ENCOUNTER — Encounter: Payer: Self-pay | Admitting: Internal Medicine

## 2012-11-30 ENCOUNTER — Ambulatory Visit (INDEPENDENT_AMBULATORY_CARE_PROVIDER_SITE_OTHER): Payer: BC Managed Care – PPO | Admitting: Internal Medicine

## 2012-11-30 VITALS — BP 120/80 | HR 72 | Temp 98.8°F | Resp 13 | Ht 66.0 in | Wt 146.0 lb

## 2012-11-30 DIAGNOSIS — E785 Hyperlipidemia, unspecified: Secondary | ICD-10-CM

## 2012-11-30 DIAGNOSIS — Z0000 Encounter for general adult medical examination without abnormal findings: Secondary | ICD-10-CM

## 2012-11-30 DIAGNOSIS — Z86010 Personal history of colonic polyps: Secondary | ICD-10-CM

## 2012-11-30 MED ORDER — HYDROCORTISONE ACE-PRAMOXINE 1-1 % RE FOAM: 1.0000 | Freq: Two times a day (BID) | RECTAL | Status: DC

## 2012-11-30 MED ORDER — LORAZEPAM 0.5 MG PO TABS: ORAL_TABLET | ORAL | Status: DC

## 2012-11-30 NOTE — Patient Instructions (Signed)
Please  schedule fasting Labs: BMET, hepatic panel, TSH, CBC & dif.  As per the Standard of Care , screening Colonoscopy recommended THIS YEAR due to prior TUBULAR ADENOMAS in 2011.

## 2012-11-30 NOTE — Progress Notes (Signed)
  Subjective:    Patient ID: Kevin Farley, male    DOB: Apr 30, 1955, 57 y.o.   MRN: [id number]  HPI  He is here for a physical;acute issues denied.     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 Specifically he denies chest pain, palpitations, dyspnea, or claudication.  Family history is strongly positive for coronary disease.  Advanced cholesterol testing reveals his LDL goal was less than 10.     Objective:   Physical Exam Gen.: Healthy and well-nourished in appearance. Alert, appropriate and cooperative throughout exam.Appears younger than stated age  Head: Normocephalic without obvious abnormalities; no alopecia  Eyes: No corneal or conjunctival inflammation noted. Pupils equal round reactive to light and accommodation.  Extraocular motion intact.  Ears: External  ear exam reveals no significant lesions or deformities. Canals clear .TMs normal. Hearing is grossly normal bilaterally. Nose: External nasal exam reveals no deformity or inflammation. Nasal mucosa are pink and moist. No lesions or exudates noted.   Mouth: Oral mucosa and oropharynx reveal no lesions or exudates. Teeth in good repair. Neck: No deformities, masses, or tenderness noted. Range of motion & Thyroid  normal. Lungs: Normal respiratory effort; chest expands symmetrically. Lungs are clear to auscultation without rales, wheezes, or increased work of breathing. Heart: Normal rate and rhythm. Normal S1 and S2. No gallop, click, or rub. S4 w/o murmur. Abdomen: Bowel sounds normal; abdomen soft and nontender. No masses, organomegaly or hernias noted. Genitalia: hemorrhoids active; colonoscopy recommended after topical treatment. PSA always < 1.00.                              Musculoskeletal/extremities: No deformity or scoliosis noted of  the thoracic or lumbar spine.  Accentuated curvature of upper thoracic  Spine. There is  some asymmetry of the posterior thoracic musculature suggesting occult scoliosis. No clubbing, cyanosis, edema, or significant extremity  deformity noted. Range of motion normal .Tone & strength  Normal. Joints normal . Nail health good. Able to lie down & sit up w/o help. Negative SLR bilaterally Vascular: Carotid, radial artery, dorsalis pedis and  posterior tibial pulses are full and equal. No bruits present. Neurologic: Alert and oriented x3. Deep tendon reflexes symmetrical and normal.      Skin: Intact without suspicious lesions or rashes. Keratosis upper abdomen Lymph: No cervical, axillary, or inguinal lymphadenopathy present. Psych: Mood and affect are normal. Normally interactive                                                                                        Assessment & Plan:  #1 comprehensive physical exam; no acute findings  Plan: see Orders  & Recommendations

## 2012-12-01 ENCOUNTER — Encounter: Payer: Self-pay | Admitting: Gastroenterology

## 2012-12-01 ENCOUNTER — Telehealth: Payer: Self-pay | Admitting: *Deleted

## 2012-12-01 ENCOUNTER — Other Ambulatory Visit: Payer: Self-pay | Admitting: *Deleted

## 2012-12-01 DIAGNOSIS — K649 Unspecified hemorrhoids: Secondary | ICD-10-CM

## 2012-12-01 MED ORDER — HYDROCORTISONE ACE-PRAMOXINE 2.5-1 % RE CREA: TOPICAL_CREAM | Freq: Two times a day (BID) | RECTAL | Status: DC

## 2012-12-01 NOTE — Telephone Encounter (Signed)
[facility], [facility] no longer carry protocofoam

## 2012-12-01 NOTE — Telephone Encounter (Signed)
Received [facility] from [facility] regarding Proctofoam that was prescribed for patient. Pharmacy states that they no longer carry this medication. Please advise.

## 2012-12-01 NOTE — Telephone Encounter (Signed)
What is their generic substitute ? Thanks

## 2012-12-02 ENCOUNTER — Other Ambulatory Visit (INDEPENDENT_AMBULATORY_CARE_PROVIDER_SITE_OTHER): Payer: BC Managed Care – PPO

## 2012-12-02 DIAGNOSIS — Z0000 Encounter for general adult medical examination without abnormal findings: Secondary | ICD-10-CM

## 2012-12-02 DIAGNOSIS — E785 Hyperlipidemia, unspecified: Secondary | ICD-10-CM

## 2012-12-02 LAB — CBC WITH DIFFERENTIAL/PLATELET
Basophils Absolute: 0 10*3/uL (ref 0.0–0.1)
Basophils Relative: 0.6 % (ref 0.0–3.0)
Eosinophils Absolute: 0.4 10*3/uL (ref 0.0–0.7)
Eosinophils Relative: 7.3 % — ABNORMAL HIGH (ref 0.0–5.0)
HCT: 39.2 % (ref 39.0–52.0)
Hemoglobin: 13.1 g/dL (ref 13.0–17.0)
Lymphocytes Relative: 23.6 % (ref 12.0–46.0)
Lymphs Abs: 1.2 10*3/uL (ref 0.7–4.0)
MCHC: 33.4 g/dL (ref 30.0–36.0)
MCV: 82.4 fl (ref 78.0–100.0)
Monocytes Absolute: 0.4 10*3/uL (ref 0.1–1.0)
Monocytes Relative: 8.1 % (ref 3.0–12.0)
Neutro Abs: 3 10*3/uL (ref 1.4–7.7)
Neutrophils Relative %: 60.4 % (ref 43.0–77.0)
Platelets: 175 10*3/uL (ref 150.0–400.0)
RBC: 4.76 Mil/uL (ref 4.22–5.81)
RDW: 14 % (ref 11.5–14.6)
WBC: 4.9 10*3/uL (ref 4.5–10.5)

## 2012-12-02 LAB — HEPATIC FUNCTION PANEL
ALT: 39 U/L (ref 0–53)
AST: 24 U/L (ref 0–37)
Albumin: 4.1 g/dL (ref 3.5–5.2)
Alkaline Phosphatase: 76 U/L (ref 39–117)
Bilirubin, Direct: 0 mg/dL (ref 0.0–0.3)
Total Bilirubin: 0.7 mg/dL (ref 0.3–1.2)
Total Protein: 7 g/dL (ref 6.0–8.3)

## 2012-12-02 LAB — BASIC METABOLIC PANEL
BUN: 18 mg/dL (ref 6–23)
CO2: 26 mEq/L (ref 19–32)
Calcium: 8.9 mg/dL (ref 8.4–10.5)
Chloride: 105 mEq/L (ref 96–112)
Creatinine, Ser: 1.1 mg/dL (ref 0.4–1.5)
GFR: 74.39 mL/min (ref >60.00)
Glucose, Bld: 115 mg/dL — ABNORMAL HIGH (ref 70–99)
Potassium: 4.1 mEq/L (ref 3.5–5.1)
Sodium: 141 mEq/L (ref 135–145)

## 2012-12-02 LAB — TSH: TSH: 2.23 u[IU]/mL (ref 0.35–5.50)

## 2012-12-06 ENCOUNTER — Other Ambulatory Visit: Payer: Self-pay | Admitting: Internal Medicine

## 2012-12-07 ENCOUNTER — Other Ambulatory Visit: Payer: Self-pay | Admitting: *Deleted

## 2012-12-07 DIAGNOSIS — K219 Gastro-esophageal reflux disease without esophagitis: Secondary | ICD-10-CM

## 2012-12-07 MED ORDER — DEXLANSOPRAZOLE 60 MG PO CPDR: 60.0000 mg | DELAYED_RELEASE_CAPSULE | Freq: Every day | ORAL | Status: DC

## 2012-12-07 NOTE — Telephone Encounter (Signed)
dexilant refill sent to pharmacy

## 2012-12-09 ENCOUNTER — Ambulatory Visit: Payer: BC Managed Care – PPO

## 2012-12-09 DIAGNOSIS — R7309 Other abnormal glucose: Secondary | ICD-10-CM

## 2012-12-09 LAB — HEMOGLOBIN A1C: Hgb A1c MFr Bld: 6.3 % (ref 4.6–6.5)

## 2013-01-13 ENCOUNTER — Ambulatory Visit (AMBULATORY_SURGERY_CENTER): Payer: Self-pay | Admitting: *Deleted

## 2013-01-13 VITALS — Ht 66.0 in | Wt 152.2 lb

## 2013-01-13 DIAGNOSIS — Z86010 Personal history of colonic polyps: Secondary | ICD-10-CM

## 2013-01-13 MED ORDER — MOVIPREP 100 G PO SOLR: ORAL | Status: DC

## 2013-01-13 NOTE — Progress Notes (Signed)
No allergies to eggs or soy. No problems with anesthesia.  

## 2013-01-14 ENCOUNTER — Encounter: Payer: Self-pay | Admitting: Gastroenterology

## 2013-01-27 ENCOUNTER — Ambulatory Visit (AMBULATORY_SURGERY_CENTER): Payer: BC Managed Care – PPO | Admitting: Gastroenterology

## 2013-01-27 ENCOUNTER — Encounter: Payer: Self-pay | Admitting: Gastroenterology

## 2013-01-27 VITALS — BP 119/80 | HR 62 | Temp 96.9°F | Resp 12 | Ht 66.0 in | Wt 152.0 lb

## 2013-01-27 DIAGNOSIS — D126 Benign neoplasm of colon, unspecified: Secondary | ICD-10-CM

## 2013-01-27 DIAGNOSIS — Z86010 Personal history of colonic polyps: Secondary | ICD-10-CM

## 2013-01-27 MED ORDER — SODIUM CHLORIDE 0.9 % IV SOLN: 500.0000 mL | INTRAVENOUS | Status: DC

## 2013-01-27 NOTE — Patient Instructions (Signed)
Impressions/recommendations:  Polyp (handout given) Repeat colonoscopy pending pathology results.  YOU HAD AN ENDOSCOPIC PROCEDURE TODAY AT THE [facility]: Refer to the procedure report that was given to you for any specific questions about what was found during the examination.  If the procedure report does not answer your questions, please call your gastroenterologist to clarify.  If you requested that your care partner not be given the details of your procedure findings, then the procedure report has been included in a sealed envelope for you to review at your convenience later.  YOU SHOULD EXPECT: Some feelings of bloating in the abdomen. Passage of more gas than usual.  Walking can help get rid of the air that was put into your GI tract during the procedure and reduce the bloating. If you had a lower endoscopy (such as a colonoscopy or flexible sigmoidoscopy) you may notice spotting of blood in your stool or on the toilet paper. If you underwent a bowel prep for your procedure, then you may not have a normal bowel movement for a few days.  DIET: Your first meal following the procedure should be a light meal and then it is ok to progress to your normal diet.  A half-sandwich or bowl of soup is an example of a good first meal.  Heavy or fried foods are harder to digest and may make you feel nauseous or bloated.  Likewise meals heavy in dairy and vegetables can cause extra gas to form and this can also increase the bloating.  Drink plenty of fluids but you should avoid alcoholic beverages for 24 hours.  ACTIVITY: Your care partner should take you home directly after the procedure.  You should plan to take it easy, moving slowly for the rest of the day.  You can resume normal activity the day after the procedure however you should NOT DRIVE or use heavy machinery for 24 hours (because of the sedation medicines used during the test).    SYMPTOMS TO REPORT IMMEDIATELY: A gastroenterologist  can be reached at any hour.  During normal business hours, 8:30 AM to 5:00 PM Monday through Friday, call [phone].  After hours and on weekends, please call the GI answering service at [phone] who will take a message and have the physician on call contact you.   Following lower endoscopy (colonoscopy or flexible sigmoidoscopy):  Excessive amounts of blood in the stool  Significant tenderness or worsening of abdominal pains  Swelling of the abdomen that is new, acute  Fever of 100F or higher  FOLLOW UP: If any biopsies were taken you will be contacted by phone or by letter within the next 1-3 weeks.  Call your gastroenterologist if you have not heard about the biopsies in 3 weeks.  Our staff will call the home number listed on your records the next business day following your procedure to check on you and address any questions or concerns that you may have at that time regarding the information given to you following your procedure. This is a courtesy call and so if there is no answer at the home number and we have not heard from you through the emergency physician on call, we will assume that you have returned to your regular daily activities without incident.  SIGNATURES/CONFIDENTIALITY: You and/or your care partner have signed paperwork which will be entered into your electronic medical record.  These signatures attest to the fact that that the information above on your After Visit Summary has been reviewed   and is understood.  Full responsibility of the confidentiality of this discharge information lies with you and/or your care-partner. 

## 2013-01-27 NOTE — Op Note (Signed)
[facility] [address] [place], [address]   COLONOSCOPY PROCEDURE REPORT  PATIENT: Kevin Farley, Kevin Farley  MR#: [id number] BIRTHDATE: 1955/02/29 , 57  yrs. old GENDER: Male ENDOSCOPIST: Rachael Fee, MD PROCEDURE DATE:  01/27/2013 PROCEDURE:   Colonoscopy with snare polypectomy First Screening Colonoscopy - Avg.  risk and is 50 yrs.  old or older - No.  Prior Negative Screening - Now for repeat screening. N/A  History of Adenoma - Now for follow-up colonoscopy & has been > or = to 3 yrs.  Yes hx of adenoma.  Has been 3 or more years since last colonoscopy.  Polyps Removed Today? Yes. ASA CLASS:   Class II INDICATIONS:six small polyps removed 2011; three of them were adenomas. MEDICATIONS: Fentanyl 50 mcg IV, Versed 6 mg IV, and These medications were titrated to patient response per physician's verbal order  DESCRIPTION OF PROCEDURE:   After the risks benefits and alternatives of the procedure were thoroughly explained, informed consent was obtained.  A digital rectal exam revealed no abnormalities of the rectum.   The [id number]  endoscope was introduced through the anus and advanced to the cecum, which was identified by both the appendix and ileocecal valve. No adverse events experienced.   The quality of the prep was excellent.  The instrument was then slowly withdrawn as the colon was fully examined.  COLON FINDINGS: One polyp was found, removed and sent to pathology. This was sessile, 3mm across, located in descending segment, removed with cold snare.  The examination was otherwise normal. Retroflexed views revealed no abnormalities. The time to cecum=2 minutes 30 seconds.  Withdrawal time=7 minutes 48 seconds.  The scope was withdrawn and the procedure completed. COMPLICATIONS: There were no complications.  ENDOSCOPIC IMPRESSION: One polyp was found, removed and sent to pathology. The examination was otherwise normal.  RECOMMENDATIONS: Given  your personal history of adenomatous (pre-cancerous) polyps, you will need a repeat colonoscopy in 5 years even if the polyp removed today is NOT precancerous.  You will receive a letter within 1-2 weeks with the results of your biopsy as well as final recommendations.  Please call my office if you have not received a letter after 3 weeks.   eSigned:  Rachael Fee, MD 01/27/2013 10:16 AM   cc: Marga Melnick, MD

## 2013-01-28 ENCOUNTER — Telehealth: Payer: Self-pay | Admitting: *Deleted

## 2013-01-28 NOTE — Telephone Encounter (Signed)
No answer,  left message to call if  Questions or concerns.

## 2013-01-30 ENCOUNTER — Encounter (HOSPITAL_COMMUNITY): Payer: Self-pay | Admitting: Emergency Medicine

## 2013-01-30 ENCOUNTER — Emergency Department (HOSPITAL_COMMUNITY)
Admission: EM | Admit: 2013-01-30 | Discharge: 2013-01-30 | Disposition: A | Discharge status: Home / Self Care | Payer: BC Managed Care – PPO | Source: Home / Self Care | Attending: Emergency Medicine | Admitting: Emergency Medicine

## 2013-01-30 DIAGNOSIS — J069 Acute upper respiratory infection, unspecified: Secondary | ICD-10-CM

## 2013-01-30 LAB — POCT RAPID STREP A: Streptococcus, Group A Screen (Direct): NEGATIVE

## 2013-01-30 MED ORDER — BENZONATATE 100 MG PO CAPS: 100.0000 mg | ORAL_CAPSULE | Freq: Three times a day (TID) | ORAL | Status: DC | PRN

## 2013-01-30 NOTE — ED Provider Notes (Signed)
Medical screening examination/treatment/procedure(s) were performed by non-physician practitioner and as supervising physician I was immediately available for consultation/collaboration.  Leslee Home, M.D.  Reuben Likes, MD 01/30/13 2217

## 2013-01-30 NOTE — ED Notes (Signed)
Pt c/o sore throat onset 2 days... He describes pain as "burning" Sxs also include: left ear pain, productive cough Has been taking Nyquil and gargling warm salt water w/no relief Denies: f/v/nd He is alert w/no signs of acute distress.

## 2013-01-30 NOTE — ED Provider Notes (Signed)
CSN: [id number]     Arrival date & time 01/30/13  1159 History   First MD Initiated Contact with Patient 01/30/13 1332     Chief Complaint  Patient presents with  . Sore Throat   (Consider location/radiation/quality/duration/timing/severity/associated sxs/prior Treatment) HPI Comments: Began 2 days ago. Describes a "burning" sensation in his throat. Denies fever.  Patient is a 57 y.o. male presenting with pharyngitis. The history is provided by the patient.  Sore Throat This is a new problem. The problem has not changed since onset.Pertinent negatives include no headaches. Associated symptoms comments: +mild, non-productive cough, rhinorrhea and occasional ear discomfort..    Past Medical History  Diagnosis Date  . Hyperlipidemia   . Allergic rhinitis   . Supraspinatus tendon tear   . Colon polyps     adenomatous 03/2009  . GERD (gastroesophageal reflux disease)   . Family history of ischemic heart disease   . Other abnormal glucose     A1c 6% in 06/2009   Past Surgical History  Procedure Laterality Date  . Corneal transplant Left 1993    Dr Dagoberto Ligas  . Back surgery  2006    Dr Channing Mutters, [place]  . Nasal bone surgery  2007  . Cardic catherization  2005    negative; Dr Marni Griffon  . Colon polyps  03/2009    Dr.Jacobs repeat due 2014  . Upper gastrointestinal endoscopy  2011    mild gastritis; Dr Christella Hartigan   Family History  Problem Relation Age of Onset  . Hypertension Mother   . Diabetes Mother   . Stroke Mother     in 81s  . Diabetes Father   . Hypertension Father   . Heart attack Father     had open heart surgery  . Diabetes Brother     X2  . Heart attack Brother      X 2 had MI in 107s  . Stroke Brother     in 33s  . Kidney failure Brother   . Hypertension Brother   . Diabetes Sister      X 2  . Hypertension Sister   . COPD Neg Hx   . Asthma Neg Hx   . Cancer Neg Hx   . Colon polyps Neg Hx   . Kidney failure Brother     renal transplant  . Diabetes Brother     History  Substance Use Topics  . Smoking status: Never Smoker   . Smokeless tobacco: Never Used  . Alcohol Use: 8.4 oz/week    14 Glasses of wine per week     Comment: two glasses of wine with dinner every night    Review of Systems  Constitutional: Negative.   HENT: Positive for congestion, ear pain, rhinorrhea and sore throat. Negative for facial swelling, hearing loss, mouth sores, sinus pressure, sneezing, tinnitus, trouble swallowing and voice change.   Respiratory: Positive for cough.   Cardiovascular: Negative.   Gastrointestinal: Negative.   Genitourinary: Negative.   Musculoskeletal: Negative.   Skin: Negative.   Neurological: Negative for weakness, light-headedness and headaches.  Hematological: Negative for adenopathy.  Psychiatric/Behavioral: Negative.     Allergies  Aspirin; Other; and Ezetimibe-simvastatin  Home Medications   Current Outpatient Rx  Name  Route  Sig  Dispense  Refill  . atorvastatin (LIPITOR) 40 MG tablet   Oral   Take 40 mg by mouth daily.         . benzonatate (TESSALON) 100 MG capsule   Oral   Take 1  capsule (100 mg total) by mouth 3 (three) times daily as needed for cough.   21 capsule   0   . Coenzyme Q10 (CO Q-10) 100 MG CAPS   Oral   Take 1 capsule by mouth daily.         Marland Kitchen dexlansoprazole (DEXILANT) 60 MG capsule   Oral   Take 1 capsule (60 mg total) by mouth daily.   30 capsule   5   . ezetimibe (ZETIA) 10 MG tablet   Oral   Take 10 mg by mouth daily.         . Flaxseed, Linseed, (FLAX SEED OIL PO)   Oral   Take by mouth.         . fluticasone (FLONASE) 50 MCG/ACT nasal spray      PLACE 2 SPRAYS INTO THE NOSE DAILY. AS NEEDED   16 g   3   . hydrocortisone-pramoxine (ANALPRAM-HC) 2.5-1 % rectal cream   Rectal   Place rectally 2 (two) times daily.   30 g   3   . LORazepam (ATIVAN) 0.5 MG tablet      qhs prn   30 tablet   2   . Multiple Vitamin (MULTIVITAMIN) tablet   Oral   Take 1 tablet by  mouth daily.         . Omega-3 Fatty Acids (FISH OIL) 500 MG CAPS   Oral   Take 1 capsule by mouth daily.         . Psyllium (METAMUCIL PO)   Oral   Take 2 capsules by mouth daily.          BP 120/83  Pulse 80  Temp(Src) 98.5 F (36.9 C) (Oral)  Resp 16  SpO2 99% Physical Exam  Nursing note and vitals reviewed. Constitutional: He is oriented to person, place, and time. He appears well-developed and well-nourished. No distress.  HENT:  Head: Normocephalic and atraumatic.  Right Ear: Hearing, tympanic membrane, external ear and ear canal normal.  Left Ear: Hearing, tympanic membrane, external ear and ear canal normal.  Nose: Nose normal.  Mouth/Throat: Uvula is midline, oropharynx is clear and moist and mucous membranes are normal.  Eyes: Conjunctivae are normal. Right eye exhibits no discharge. Left eye exhibits no discharge. No scleral icterus.  Neck: Normal range of motion. Neck supple. No thyromegaly present.  Cardiovascular: Normal rate, regular rhythm and normal heart sounds.   Pulmonary/Chest: Effort normal and breath sounds normal.  Abdominal: Soft. Bowel sounds are normal. There is no tenderness.  Musculoskeletal: Normal range of motion.  Lymphadenopathy:    He has no cervical adenopathy.  Neurological: He is alert and oriented to person, place, and time.  Skin: Skin is warm and dry.  Psychiatric: He has a normal mood and affect. His behavior is normal.    ED Course  Procedures (including critical care time) Labs Review Labs Reviewed  CULTURE, GROUP A STREP  POCT RAPID STREP A ([facility]RG CARE ONLY)   Imaging Review No results found.  EKG Interpretation    Date/Time:    Ventricular Rate:    PR Interval:    QRS Duration:   QT Interval:    QTC Calculation:   R Axis:     Text Interpretation:              MDM  Rapid strep negative. Advised patient regarding symptomatic care at home.    Jess Barters [place], [place] 01/30/13 [phone]

## 2013-02-01 LAB — CULTURE, GROUP A STREP

## 2013-02-03 ENCOUNTER — Encounter: Payer: Self-pay | Admitting: Gastroenterology

## 2013-04-12 ENCOUNTER — Encounter: Payer: Self-pay | Admitting: Internal Medicine

## 2013-04-12 ENCOUNTER — Ambulatory Visit (INDEPENDENT_AMBULATORY_CARE_PROVIDER_SITE_OTHER)
Admission: RE | Admit: 2013-04-12 | Discharge: 2013-04-12 | Disposition: A | Discharge status: Home / Self Care | Payer: BC Managed Care – PPO | Source: Ambulatory Visit | Attending: Internal Medicine | Admitting: Internal Medicine

## 2013-04-12 ENCOUNTER — Ambulatory Visit (INDEPENDENT_AMBULATORY_CARE_PROVIDER_SITE_OTHER): Payer: BC Managed Care – PPO | Admitting: Internal Medicine

## 2013-04-12 VITALS — BP 122/70 | HR 78 | Temp 97.8°F | Resp 13 | Wt 151.0 lb

## 2013-04-12 DIAGNOSIS — IMO0001 Reserved for inherently not codable concepts without codable children: Secondary | ICD-10-CM

## 2013-04-12 DIAGNOSIS — R61 Generalized hyperhidrosis: Secondary | ICD-10-CM

## 2013-04-12 DIAGNOSIS — J984 Other disorders of lung: Secondary | ICD-10-CM

## 2013-04-12 DIAGNOSIS — E785 Hyperlipidemia, unspecified: Secondary | ICD-10-CM

## 2013-04-12 DIAGNOSIS — E559 Vitamin D deficiency, unspecified: Secondary | ICD-10-CM

## 2013-04-12 NOTE — Progress Notes (Signed)
Pre visit review using our clinic review tool, if applicable. No additional management support is needed unless otherwise documented below in the visit note. 

## 2013-04-12 NOTE — Progress Notes (Signed)
   Subjective:    Patient ID: Kevin Farley, male    DOB: [birth year], 58 y.o.   MRN: [id number]  HPI   He has had myalgias intermittently for years in the context of statin therapy.  There has been some exacerbation in the last 2-3 weeks with chest wall soreness in the anterior and posterior thorax.  The symptoms are worse in the morning. He states that when he is busy he is not aware of them. 2 Aleve as needed are of benefit  He also has night sweats nightly for 3-4 months.  He has had a pulmonary nodule on the left upper lobe; he is concerned about its status.  He plans to travel internationally and wanted to have his lipids updated.    Review of Systems  The night sweats are not associated with chills or fever  He has no associated weight loss  He also denies cough, sputum production, or hemoptysis.     Objective:   Physical Exam General appearance:good health ;well nourished; no acute distress or increased work of breathing is present.  No  lymphadenopathy about the head, neck, or axilla noted.   Eyes: No conjunctival inflammation or lid edema is present. There is no scleral icterus.  Ears:  External ear exam shows no significant lesions or deformities.  Otoscopic examination reveals clear canals, tympanic membranes are intact bilaterally without bulging, retraction, inflammation or discharge.  Nose:  External nasal examination shows no deformity or inflammation. Nasal mucosa are pink and moist without lesions or exudates. No septal dislocation or deviation.No obstruction to airflow.   Oral exam: Dental hygiene is good; lips and gums are healthy appearing.There is no oropharyngeal erythema or exudate noted.   Neck:  No deformities, thyromegaly, masses, or tenderness noted.   Supple with full range of motion without pain.   Heart:  Normal rate and regular rhythm. S1 and S2 normal without gallop, murmur, click, rub or other extra sounds.   Lungs:Chest clear to auscultation;  no wheezes, rhonchi,rales ,or rubs present.No increased work of breathing.    Extremities:  No cyanosis, edema, or clubbing  noted    Skin: Warm & dry w/o jaundice or tenting.         Assessment & Plan:  #53myalgias #2 night sweats #3 dyslipidemia #4 pulmonary nodule See orders

## 2013-04-12 NOTE — Patient Instructions (Signed)
Your next office appointment will be determined based upon review of your pending labs & x-rays. Those instructions will be transmitted to you through My Chart . 

## 2013-04-13 ENCOUNTER — Ambulatory Visit: Payer: BC Managed Care – PPO

## 2013-04-13 DIAGNOSIS — E559 Vitamin D deficiency, unspecified: Secondary | ICD-10-CM

## 2013-04-13 DIAGNOSIS — E785 Hyperlipidemia, unspecified: Secondary | ICD-10-CM

## 2013-04-13 DIAGNOSIS — IMO0001 Reserved for inherently not codable concepts without codable children: Secondary | ICD-10-CM

## 2013-04-13 LAB — HEPATIC FUNCTION PANEL
ALT: 96 U/L — ABNORMAL HIGH (ref 0–53)
AST: 54 U/L — ABNORMAL HIGH (ref 0–37)
Albumin: 4.4 g/dL (ref 3.5–5.2)
Alkaline Phosphatase: 80 U/L (ref 39–117)
Bilirubin, Direct: 0 mg/dL (ref 0.0–0.3)
Total Bilirubin: 0.6 mg/dL (ref 0.3–1.2)
Total Protein: 7.4 g/dL (ref 6.0–8.3)

## 2013-04-13 LAB — LIPID PANEL
Cholesterol: 228 mg/dL — ABNORMAL HIGH (ref 0–200)
HDL: 63.5 mg/dL (ref >39.00)
LDL Cholesterol: 136 mg/dL — ABNORMAL HIGH (ref 0–99)
Total CHOL/HDL Ratio: 4
Triglycerides: 145 mg/dL (ref 0.0–149.0)
VLDL: 29 mg/dL (ref 0.0–40.0)

## 2013-04-13 LAB — SEDIMENTATION RATE: Sed Rate: 7 mm/hr (ref 0–22)

## 2013-04-13 LAB — CK: Total CK: 106 U/L (ref 7–232)

## 2013-04-15 ENCOUNTER — Other Ambulatory Visit: Payer: Self-pay | Admitting: Internal Medicine

## 2013-04-15 DIAGNOSIS — R740 Nonspecific elevation of levels of transaminase and lactic acid dehydrogenase [LDH]: Principal | ICD-10-CM

## 2013-04-15 DIAGNOSIS — R7401 Elevation of levels of liver transaminase levels: Secondary | ICD-10-CM

## 2013-04-15 DIAGNOSIS — R7402 Elevation of levels of lactic acid dehydrogenase (LDH): Secondary | ICD-10-CM

## 2013-04-16 LAB — VITAMIN D 1,25 DIHYDROXY
Vitamin D 1, 25 (OH)2 Total: 50 pg/mL (ref 18–72)
Vitamin D2 1, 25 (OH)2: <8 pg/mL
Vitamin D3 1, 25 (OH)2: 50 pg/mL

## 2013-05-25 ENCOUNTER — Encounter: Payer: Self-pay | Admitting: Internal Medicine

## 2013-05-25 ENCOUNTER — Ambulatory Visit (INDEPENDENT_AMBULATORY_CARE_PROVIDER_SITE_OTHER)
Admission: RE | Admit: 2013-05-25 | Discharge: 2013-05-25 | Disposition: A | Discharge status: Home / Self Care | Payer: BC Managed Care – PPO | Source: Ambulatory Visit | Attending: Internal Medicine | Admitting: Internal Medicine

## 2013-05-25 ENCOUNTER — Ambulatory Visit (INDEPENDENT_AMBULATORY_CARE_PROVIDER_SITE_OTHER): Payer: BC Managed Care – PPO | Admitting: Internal Medicine

## 2013-05-25 VITALS — BP 128/94 | HR 68 | Temp 98.4°F | Resp 12 | Wt 147.0 lb

## 2013-05-25 DIAGNOSIS — M5412 Radiculopathy, cervical region: Secondary | ICD-10-CM

## 2013-05-25 NOTE — Progress Notes (Signed)
   Subjective:    Patient ID: Kevin Farley, male    DOB: 23-Oct-1955, 58 y.o.   MRN: [id number]  HPI He describes discomfort in the left upper chest, neck, and shoulder area for 3-4 months. He is described as dull, up to level IV. It is worse in am & aggravated by rotating the thorax to the right & much less with neck rotation.With such motion he will have radiation of the discomfort to the back. It has been stable in character.  He has no past history of neck or upper back injury. Aleve helps.  He has also had some muscle cramping.     Review of Systems He specifically denies any redness, swelling, joint stiffness in the area of the shoulder girdle.  There is no change in color or temperature of the skin in this area  He's had no rash in this area either.  There no associated fever, chills, sweats, or weight loss.  There is no weakness, numbness, tingling in the upper extremities  There has been no urine or stool incontinence.  He has no abnormal bruising or bleeding. He has no enlargement of lymph nodes.      Objective:   Physical Exam Gen.: Healthy and well-nourished in appearance. Alert, appropriate and cooperative throughout exam. Appears younger than stated age  Head: Normocephalic without obvious abnormalities Eyes: No corneal or conjunctival inflammation noted. Pupils equal round reactive to light and accommodation. Extraocular motion intact.  Neck: No deformities, masses, or tenderness noted. Range of motion &  Thyroid normal. Lungs: Normal respiratory effort; chest expands symmetrically. Lungs are clear to auscultation without rales, wheezes, or increased work of breathing. Heart: Normal rate and rhythm. Normal S1 and S2. No gallop, click, or rub. No murmur.                               Musculoskeletal/extremities: No deformity or scoliosis noted of  the thoracic or lumbar spine.   No clubbing, cyanosis, edema, or significant extremity  deformity noted. Range of motion  normal .Tone & strength normal. Hand joints normal   Fingernail  health good. Vascular: Carotid, radial artery, dorsalis pedis and  posterior tibial pulses are full and equal. No bruits present. Neurologic: Alert and oriented x3. Deep tendon reflexes symmetrical and normal. No neuromuscular deficit to opposition in the upper extremities Gait normal . Skin: Intact without suspicious lesions or rashes. Lymph: No cervical, axillary lymphadenopathy present. Psych: Mood and affect are normal. Normally interactive                                                                                        Assessment & Plan:  #1 cervical radiculopathy  Plan: See orders

## 2013-05-25 NOTE — Progress Notes (Signed)
Pre visit review using our clinic review tool, if applicable. No additional management support is needed unless otherwise documented below in the visit note. 

## 2013-05-25 NOTE — Patient Instructions (Addendum)
Use a cervical memory foam pillow to prevent hyperextension or hyperflexion of the cervical spine. Use an anti-inflammatory cream such as Aspercreme or Zostrix cream twice a day to the affected area as needed. In lieu of this warm moist compresses or  hot water bottle can be used. Do not apply ice . Your next office appointment will be determined based upon review of your pending x-rays. Those instructions will be transmitted to you through My Chart. Followup as needed for your acute issue. Please report any significant change in your symptoms.

## 2013-06-07 ENCOUNTER — Other Ambulatory Visit: Payer: Self-pay

## 2013-06-07 MED ORDER — FLUTICASONE PROPIONATE 50 MCG/ACT NA SUSP: NASAL | Status: DC

## 2013-06-14 ENCOUNTER — Other Ambulatory Visit: Payer: Self-pay | Admitting: Internal Medicine

## 2013-06-14 ENCOUNTER — Telehealth: Payer: Self-pay | Admitting: Internal Medicine

## 2013-06-14 DIAGNOSIS — M5412 Radiculopathy, cervical region: Secondary | ICD-10-CM

## 2013-06-14 NOTE — Telephone Encounter (Signed)
OK see last OV

## 2013-06-14 NOTE — Telephone Encounter (Signed)
Patient's wife called to request a referral for the patient to go to physical therapy for his shoulder and back.

## 2013-06-14 NOTE — Telephone Encounter (Signed)
I do not see referral placed. Will you please place referral for patient

## 2013-06-16 ENCOUNTER — Encounter: Payer: Self-pay | Admitting: Internal Medicine

## 2013-06-18 ENCOUNTER — Emergency Department (HOSPITAL_COMMUNITY)
Admission: EM | Admit: 2013-06-18 | Discharge: 2013-06-18 | Disposition: A | Discharge status: Home / Self Care | Payer: BC Managed Care – PPO | Source: Home / Self Care

## 2013-06-18 ENCOUNTER — Encounter (HOSPITAL_COMMUNITY): Payer: Self-pay | Admitting: Emergency Medicine

## 2013-06-18 DIAGNOSIS — J029 Acute pharyngitis, unspecified: Secondary | ICD-10-CM

## 2013-06-18 DIAGNOSIS — R0982 Postnasal drip: Secondary | ICD-10-CM

## 2013-06-18 DIAGNOSIS — J309 Allergic rhinitis, unspecified: Secondary | ICD-10-CM

## 2013-06-18 LAB — POCT RAPID STREP A: Streptococcus, Group A Screen (Direct): NEGATIVE

## 2013-06-18 NOTE — Discharge Instructions (Signed)
Allergic Rhinitis Allegra 180 mg a day flonase nasal spray Lots of saline nasal spray to clear nasal passages Sudafed PE for congestion Cepacol lozenges and ibuprofen for sorethroat.  Robitussin DM for cough Allergic rhinitis is when the mucous membranes in the nose respond to allergens. Allergens are particles in the air that cause your body to have an allergic reaction. This causes you to release allergic antibodies. Through a chain of events, these eventually cause you to release histamine into the blood stream. Although meant to protect the body, it is this release of histamine that causes your discomfort, such as frequent sneezing, congestion, and an itchy, runny nose.  CAUSES  Seasonal allergic rhinitis (hay fever) is caused by pollen allergens that may come from grasses, trees, and weeds. Year-round allergic rhinitis (perennial allergic rhinitis) is caused by allergens such as house dust mites, pet dander, and mold spores.  SYMPTOMS   Nasal stuffiness (congestion).  Itchy, runny nose with sneezing and tearing of the eyes. DIAGNOSIS  Your health care provider can help you determine the allergen or allergens that trigger your symptoms. If you and your health care provider are unable to determine the allergen, skin or blood testing may be used. TREATMENT  Allergic Rhinitis does not have a cure, but it can be controlled by:  Medicines and allergy shots (immunotherapy).  Avoiding the allergen. Hay fever may often be treated with antihistamines in pill or nasal spray forms. Antihistamines block the effects of histamine. There are over-the-counter medicines that may help with nasal congestion and swelling around the eyes. Check with your health care provider before taking or giving this medicine.  If avoiding the allergen or the medicine prescribed do not work, there are many new medicines your health care provider can prescribe. Stronger medicine may be used if initial measures are  ineffective. Desensitizing injections can be used if medicine and avoidance does not work. Desensitization is when a patient is given ongoing shots until the body becomes less sensitive to the allergen. Make sure you follow up with your health care provider if problems continue. HOME CARE INSTRUCTIONS It is not possible to completely avoid allergens, but you can reduce your symptoms by taking steps to limit your exposure to them. It helps to know exactly what you are allergic to so that you can avoid your specific triggers. SEEK MEDICAL CARE IF:   You have a fever.  You develop a cough that does not stop easily (persistent).  You have shortness of breath.  You start wheezing.  Symptoms interfere with normal daily activities. Document Released: 10/23/2000 Document Revised: 11/18/2012 Document Reviewed: 10/05/2012 [facility] Patient Information 2014 [place].

## 2013-06-18 NOTE — ED Provider Notes (Signed)
CSN: [id number]     Arrival date & time 06/18/13  1210 History   First MD Initiated Contact with Patient 06/18/13 1246     Chief Complaint  Patient presents with  . Sore Throat   (Consider location/radiation/quality/duration/timing/severity/associated sxs/prior Treatment) HPI Comments: C/o sore throat, burning throat, eyes and ears itching and cough.  Patient is a 58 y.o. male presenting with pharyngitis.  Sore Throat Pertinent negatives include no shortness of breath.    Past Medical History  Diagnosis Date  . Hyperlipidemia   . Allergic rhinitis   . Supraspinatus tendon tear   . Colon polyps     adenomatous 03/2009  . GERD (gastroesophageal reflux disease)   . Family history of ischemic heart disease   . Other abnormal glucose     A1c 6% in 06/2009   Past Surgical History  Procedure Laterality Date  . Corneal transplant Left 1993    Dr Kathrin Penner  . Back surgery  2006    Dr Carloyn Manner, [place]  . Nasal bone surgery  2007  . Cardic catherization  2005    negative; Dr Lyla Son  . Colon polyps  03/2009    Dr.Jacobs repeat due 2014  . Upper gastrointestinal endoscopy  2011    mild gastritis; Dr Ardis Hughs   Family History  Problem Relation Age of Onset  . Hypertension Mother   . Diabetes Mother   . Stroke Mother     in 87s  . Diabetes Father   . Hypertension Father   . Heart attack Father     had open heart surgery  . Diabetes Brother     X2  . Heart attack Brother      X 2 had MI in 24s  . Stroke Brother     in 72s  . Kidney failure Brother   . Hypertension Brother   . Diabetes Sister      X 2  . Hypertension Sister   . COPD Neg Hx   . Asthma Neg Hx   . Cancer Neg Hx   . Colon polyps Neg Hx   . Kidney failure Brother     renal transplant  . Diabetes Brother    History  Substance Use Topics  . Smoking status: Never Smoker   . Smokeless tobacco: Never Used  . Alcohol Use: 8.4 oz/week    14 Glasses of wine per week     Comment: two glasses of wine with dinner every  night    Review of Systems  Constitutional: Negative.   HENT: Positive for rhinorrhea and sore throat. Negative for congestion, ear pain, postnasal drip and sinus pressure.   Respiratory: Positive for cough. Negative for shortness of breath.   Cardiovascular: Negative.   Gastrointestinal: Negative.     Allergies  Aspirin; Other; and Ezetimibe-simvastatin  Home Medications   Prior to Admission medications   Medication Sig Start Date End Date Taking? Authorizing Provider  Flaxseed, Linseed, (FLAX SEED OIL PO) Take by mouth.   Yes Historical Provider, MD  atorvastatin (LIPITOR) 40 MG tablet Take 40 mg by mouth daily.    Historical Provider, MD  Coenzyme Q10 (CO Q-10) 100 MG CAPS Take 1 capsule by mouth daily.    Historical Provider, MD  dexlansoprazole (DEXILANT) 60 MG capsule Take 1 capsule (60 mg total) by mouth daily. 12/07/12   Hendricks Limes, MD  ezetimibe (ZETIA) 10 MG tablet Take 10 mg by mouth daily.    Historical Provider, MD  fluticasone (FLONASE) 50 MCG/ACT nasal  spray PLACE 2 SPRAYS INTO THE NOSE DAILY. AS NEEDED 06/07/13   Hendricks Limes, MD  hydrocortisone-pramoxine [facility]) 2.5-1 % rectal cream Place rectally 2 (two) times daily. 12/01/12   Hendricks Limes, MD  Multiple Vitamin (MULTIVITAMIN) tablet Take 1 tablet by mouth daily.    Historical Provider, MD  Omega-3 Fatty Acids (FISH OIL) 500 MG CAPS Take 1 capsule by mouth daily.    Historical Provider, MD  Psyllium (METAMUCIL PO) Take 2 capsules by mouth daily.    Historical Provider, MD   There were no vitals taken for this visit. Physical Exam  Nursing note and vitals reviewed. Constitutional: He is oriented to person, place, and time. He appears well-developed and well-nourished. No distress.  HENT:  Right Ear: External ear normal.  Left Ear: External ear normal.  Mouth/Throat: No oropharyngeal exudate.  OP with mild injection, clear PND  Eyes: Conjunctivae and EOM are normal.  Neck: Normal range of  motion. Neck supple.  Cardiovascular: Normal rate, regular rhythm and normal heart sounds.   Pulmonary/Chest: Effort normal and breath sounds normal. No respiratory distress. He has no rales.  Musculoskeletal: Normal range of motion. He exhibits no edema.  Lymphadenopathy:    He has no cervical adenopathy.  Neurological: He is alert and oriented to person, place, and time.  Skin: Skin is warm and dry. No rash noted.  Psychiatric: He has a normal mood and affect.    ED Course  Procedures (including critical care time) Labs Review Labs Reviewed  POCT RAPID STREP A ([facility]RG CARE ONLY)    Imaging Review No results found.   MDM   1. Allergic rhinosinusitis   2. PND (post-nasal drip)   3. Allergic pharyngitis     OTC meds for allergies, nasal sprays Motrin, cepacol loz Fluids     Janne Napoleon, NP 06/18/13 1300

## 2013-06-18 NOTE — ED Notes (Signed)
Sore throat x couple days, no improvement w home treatment

## 2013-06-19 ENCOUNTER — Ambulatory Visit: Payer: BC Managed Care – PPO | Admitting: Family Medicine

## 2013-06-20 LAB — CULTURE, GROUP A STREP

## 2013-06-20 NOTE — ED Provider Notes (Signed)
Medical screening examination/treatment/procedure(s) were performed by a resident physician or non-physician practitioner and as the supervising physician I was immediately available for consultation/collaboration.   , MD     S , MD 06/20/13 0903 

## 2013-06-21 ENCOUNTER — Encounter: Payer: Self-pay | Admitting: Internal Medicine

## 2013-06-28 ENCOUNTER — Ambulatory Visit: Payer: BC Managed Care – PPO | Attending: Internal Medicine | Admitting: Physical Therapy

## 2013-06-28 DIAGNOSIS — IMO0001 Reserved for inherently not codable concepts without codable children: Secondary | ICD-10-CM | POA: Diagnosis present

## 2013-06-28 DIAGNOSIS — M542 Cervicalgia: Secondary | ICD-10-CM | POA: Diagnosis not present

## 2013-07-01 ENCOUNTER — Ambulatory Visit: Payer: BC Managed Care – PPO | Admitting: Physical Therapy

## 2013-07-01 DIAGNOSIS — IMO0001 Reserved for inherently not codable concepts without codable children: Secondary | ICD-10-CM | POA: Diagnosis not present

## 2013-07-07 ENCOUNTER — Ambulatory Visit: Payer: BC Managed Care – PPO | Admitting: Physical Therapy

## 2013-07-07 DIAGNOSIS — IMO0001 Reserved for inherently not codable concepts without codable children: Secondary | ICD-10-CM | POA: Diagnosis not present

## 2013-07-13 ENCOUNTER — Ambulatory Visit: Payer: BC Managed Care – PPO | Attending: Internal Medicine | Admitting: Physical Therapy

## 2013-07-13 DIAGNOSIS — M542 Cervicalgia: Secondary | ICD-10-CM | POA: Insufficient documentation

## 2013-07-13 DIAGNOSIS — IMO0001 Reserved for inherently not codable concepts without codable children: Secondary | ICD-10-CM | POA: Diagnosis not present

## 2013-07-15 ENCOUNTER — Ambulatory Visit: Payer: BC Managed Care – PPO | Admitting: Physical Therapy

## 2013-07-15 DIAGNOSIS — IMO0001 Reserved for inherently not codable concepts without codable children: Secondary | ICD-10-CM | POA: Diagnosis not present

## 2013-07-20 ENCOUNTER — Ambulatory Visit: Payer: BC Managed Care – PPO | Admitting: Physical Therapy

## 2013-07-20 DIAGNOSIS — IMO0001 Reserved for inherently not codable concepts without codable children: Secondary | ICD-10-CM | POA: Diagnosis not present

## 2013-07-22 ENCOUNTER — Ambulatory Visit: Payer: BC Managed Care – PPO | Admitting: Physical Therapy

## 2013-07-22 DIAGNOSIS — IMO0001 Reserved for inherently not codable concepts without codable children: Secondary | ICD-10-CM | POA: Diagnosis not present

## 2013-07-27 ENCOUNTER — Ambulatory Visit: Payer: BC Managed Care – PPO | Admitting: Physical Therapy

## 2013-07-29 ENCOUNTER — Ambulatory Visit: Payer: BC Managed Care – PPO | Admitting: Physical Therapy

## 2013-07-30 ENCOUNTER — Encounter (HOSPITAL_COMMUNITY): Payer: Self-pay | Admitting: Emergency Medicine

## 2013-07-30 ENCOUNTER — Emergency Department (HOSPITAL_COMMUNITY)
Admission: EM | Admit: 2013-07-30 | Discharge: 2013-07-30 | Disposition: A | Discharge status: Home / Self Care | Payer: BC Managed Care – PPO | Source: Home / Self Care | Attending: Family Medicine | Admitting: Family Medicine

## 2013-07-30 DIAGNOSIS — J40 Bronchitis, not specified as acute or chronic: Secondary | ICD-10-CM

## 2013-07-30 MED ORDER — PREDNISONE 10 MG PO TABS: 30.0000 mg | ORAL_TABLET | Freq: Every day | ORAL | Status: DC

## 2013-07-30 MED ORDER — AZITHROMYCIN 250 MG PO TABS: 250.0000 mg | ORAL_TABLET | Freq: Every day | ORAL | Status: DC

## 2013-07-30 MED ORDER — IPRATROPIUM-ALBUTEROL 0.5-2.5 (3) MG/3ML IN SOLN
3.0000 mL | Freq: Once | RESPIRATORY_TRACT | Status: AC
  Administered 2013-07-30: 3 mL via RESPIRATORY_TRACT

## 2013-07-30 MED ORDER — ALBUTEROL SULFATE HFA 108 (90 BASE) MCG/ACT IN AERS: 2.0000 | INHALATION_SPRAY | Freq: Four times a day (QID) | RESPIRATORY_TRACT | Status: DC | PRN

## 2013-07-30 MED ORDER — IPRATROPIUM-ALBUTEROL 0.5-2.5 (3) MG/3ML IN SOLN
RESPIRATORY_TRACT | Status: AC
  Filled 2013-07-30: qty 3

## 2013-07-30 NOTE — ED Notes (Signed)
C/o  Cough.  Green mucus.  Sore throat.  Dry mouth.   States felt feverish.  Mild relief with mucinex.    Denies n/v/d

## 2013-07-30 NOTE — Discharge Instructions (Signed)
Thank you for coming in today. Call or go to the emergency room if you get worse, have trouble breathing, have chest pains, or palpitations.    Bronchitis Bronchitis is inflammation of the airways that extend from the windpipe into the lungs (bronchi). The inflammation often causes mucus to develop, which leads to a cough. If the inflammation becomes severe, it may cause shortness of breath. CAUSES  Bronchitis may be caused by:   Viral infections.   Bacteria.   Cigarette smoke.   Allergens, pollutants, and other irritants.  SIGNS AND SYMPTOMS  The most common symptom of bronchitis is a frequent cough that produces mucus. Other symptoms include:  Fever.   Body aches.   Chest congestion.   Chills.   Shortness of breath.   Sore throat.  DIAGNOSIS  Bronchitis is usually diagnosed through a medical history and physical exam. Tests, such as chest X-rays, are sometimes done to rule out other conditions.  TREATMENT  You may need to avoid contact with whatever caused the problem (smoking, for example). Medicines are sometimes needed. These may include:  Antibiotics. These may be prescribed if the condition is caused by bacteria.  Cough suppressants. These may be prescribed for relief of cough symptoms.   Inhaled medicines. These may be prescribed to help open your airways and make it easier for you to breathe.   Steroid medicines. These may be prescribed for those with recurrent (chronic) bronchitis. HOME CARE INSTRUCTIONS  Get plenty of rest.   Drink enough fluids to keep your urine clear or pale yellow (unless you have a medical condition that requires fluid restriction). Increasing fluids may help thin your secretions and will prevent dehydration.   Only take over-the-counter or prescription medicines as directed by your health care provider.  Only take antibiotics as directed. Make sure you finish them even if you start to feel better.  Avoid secondhand  smoke, irritating chemicals, and strong fumes. These will make bronchitis worse. If you are a smoker, quit smoking. Consider using nicotine gum or skin patches to help control withdrawal symptoms. Quitting smoking will help your lungs heal faster.   Put a cool-mist humidifier in your bedroom at night to moisten the air. This may help loosen mucus. Change the water in the humidifier daily. You can also run the hot water in your shower and sit in the bathroom with the door closed for 5-10 minutes.   Follow up with your health care provider as directed.   Wash your hands frequently to avoid catching bronchitis again or spreading an infection to others.  SEEK MEDICAL CARE IF: Your symptoms do not improve after 1 week of treatment.  SEEK IMMEDIATE MEDICAL CARE IF:  Your fever increases.  You have chills.   You have chest pain.   You have worsening shortness of breath.   You have bloody sputum.  You faint.  You have lightheadedness.  You have a severe headache.   You vomit repeatedly. MAKE SURE YOU:   Understand these instructions.  Will watch your condition.  Will get help right away if you are not doing well or get worse. Document Released: 01/28/2005 Document Revised: 11/18/2012 Document Reviewed: 09/22/2012 [facility] Patient Information 2015 [place], [place]. This information is not intended to replace advice given to you by your health care provider. Make sure you discuss any questions you have with your health care provider.

## 2013-07-30 NOTE — ED Provider Notes (Signed)
Kevin Farley is a 58 y.o. male who presents to Urgent Care today for sore throat productive cough and congestion. Symptoms present for 5 days. No fevers or chills nausea vomiting or diarrhea. Patient has tried Mucinex which has helped some. No chest pain palpitations or shortness of breath.   Past Medical History  Diagnosis Date  . Hyperlipidemia   . Allergic rhinitis   . Supraspinatus tendon tear   . Colon polyps     adenomatous 03/2009  . GERD (gastroesophageal reflux disease)   . Family history of ischemic heart disease   . Other abnormal glucose     A1c 6% in 06/2009   History  Substance Use Topics  . Smoking status: Never Smoker   . Smokeless tobacco: Never Used  . Alcohol Use: 8.4 oz/week    14 Glasses of wine per week     Comment: two glasses of wine with dinner every night   ROS as above Medications: No current facility-administered medications for this encounter.   Current Outpatient Prescriptions  Medication Sig Dispense Refill  . atorvastatin (LIPITOR) 40 MG tablet Take 40 mg by mouth daily.      Marland Kitchen dexlansoprazole (DEXILANT) 60 MG capsule Take 1 capsule (60 mg total) by mouth daily.  30 capsule  5  . ezetimibe (ZETIA) 10 MG tablet Take 10 mg by mouth daily.      . Flaxseed, Linseed, (FLAX SEED OIL PO) Take by mouth.      . fluticasone (FLONASE) 50 MCG/ACT nasal spray PLACE 2 SPRAYS INTO THE NOSE DAILY. AS NEEDED  16 g  5  . hydrocortisone-pramoxine (ANALPRAM-HC) 2.5-1 % rectal cream Place rectally 2 (two) times daily.  30 g  3  . Multiple Vitamin (MULTIVITAMIN) tablet Take 1 tablet by mouth daily.      . Omega-3 Fatty Acids (FISH OIL) 500 MG CAPS Take 1 capsule by mouth daily.      . Psyllium (METAMUCIL PO) Take 2 capsules by mouth daily.      Marland Kitchen albuterol (PROVENTIL HFA;VENTOLIN HFA) 108 (90 BASE) MCG/ACT inhaler Inhale 2 puffs into the lungs every 6 (six) hours as needed for wheezing or shortness of breath.  1 Inhaler  2  . azithromycin (ZITHROMAX) 250 MG tablet Take 1  tablet (250 mg total) by mouth daily. Take first 2 tablets together, then 1 every day until finished.  6 tablet  0  . Coenzyme Q10 (CO Q-10) 100 MG CAPS Take 1 capsule by mouth daily.      . predniSONE (DELTASONE) 10 MG tablet Take 3 tablets (30 mg total) by mouth daily.  15 tablet  0  . [DISCONTINUED] Lansoprazole (PREVACID PO) Take by mouth daily.        . [DISCONTINUED] omeprazole (PRILOSEC) 40 MG capsule Take 1 capsule (40 mg total) by mouth daily.  180 capsule  1    Exam:  BP 135/90  Pulse 82  Temp(Src) 98.7 F (37.1 C) (Oral)  Resp 16  SpO2 96% Gen: Well NAD HEENT: EOMI,  MMM posterior pharynx with cobblestoning. Tympanic membranes are normal appearing bilaterally Lungs: Normal work of breathing. Coarse breath sounds and wheezing present bilaterally Heart: RRR no MRG Abd: NABS, Soft. NT, ND Exts: Brisk capillary refill, warm and well perfused.   Patient was given a DuoNeb nebulizer treatment and had significant improvement in symptoms. His lung exam improved as well.  No results found for this or any previous visit (from the past 24 hour(s)). No results found.  Assessment and  Plan: 58 y.o. male with bronchitis. Plan to treat with prednisone albuterol and azithromycin.  Discussed warning signs or symptoms. Please see discharge instructions. Patient expresses understanding.    Gregor Hams, MD 07/30/13 [phone]

## 2013-07-31 ENCOUNTER — Ambulatory Visit: Payer: BC Managed Care – PPO | Admitting: Family Medicine

## 2013-08-11 ENCOUNTER — Ambulatory Visit: Payer: BC Managed Care – PPO | Admitting: Physical Therapy

## 2013-08-12 ENCOUNTER — Ambulatory Visit: Payer: BC Managed Care – PPO | Admitting: Physical Therapy

## 2013-08-23 ENCOUNTER — Ambulatory Visit: Payer: BC Managed Care – PPO | Admitting: Physical Therapy

## 2013-10-01 ENCOUNTER — Encounter: Payer: Self-pay | Admitting: Gastroenterology

## 2013-10-08 ENCOUNTER — Telehealth: Payer: Self-pay

## 2013-10-08 NOTE — Telephone Encounter (Signed)
Prior authorization has been approved for Dexilant 60 mg

## 2013-10-08 NOTE — Telephone Encounter (Signed)
Prior authorization for Dexilant 60 mg is required. This has been submitted via cover my meds. Waiting on insurance response.

## 2013-10-20 ENCOUNTER — Telehealth: Payer: Self-pay | Admitting: Internal Medicine

## 2013-10-20 MED ORDER — DEXLANSOPRAZOLE 60 MG PO CPDR: 60.0000 mg | DELAYED_RELEASE_CAPSULE | Freq: Every day | ORAL | Status: DC

## 2013-10-20 NOTE — Telephone Encounter (Signed)
Prior authorization for Dexilant has been approved

## 2013-10-20 NOTE — Telephone Encounter (Signed)
Kevin Farley called request refill for Dexilant to be send to [facility], pt is out for a bout a month now and the drug store requested 3 times.

## 2013-10-26 ENCOUNTER — Other Ambulatory Visit: Payer: Self-pay | Admitting: Internal Medicine

## 2013-10-27 ENCOUNTER — Encounter: Payer: Self-pay | Admitting: Physician Assistant

## 2013-10-27 ENCOUNTER — Ambulatory Visit (INDEPENDENT_AMBULATORY_CARE_PROVIDER_SITE_OTHER): Payer: BC Managed Care – PPO | Admitting: Physician Assistant

## 2013-10-27 VITALS — BP 128/78 | HR 79 | Temp 98.1°F | Resp 16 | Ht 66.0 in | Wt 149.0 lb

## 2013-10-27 DIAGNOSIS — H6590 Unspecified nonsuppurative otitis media, unspecified ear: Secondary | ICD-10-CM

## 2013-10-27 DIAGNOSIS — H6591 Unspecified nonsuppurative otitis media, right ear: Secondary | ICD-10-CM | POA: Insufficient documentation

## 2013-10-27 DIAGNOSIS — Z23 Encounter for immunization: Secondary | ICD-10-CM

## 2013-10-27 DIAGNOSIS — J209 Acute bronchitis, unspecified: Secondary | ICD-10-CM | POA: Insufficient documentation

## 2013-10-27 MED ORDER — AMOXICILLIN-POT CLAVULANATE 875-125 MG PO TABS: 1.0000 | ORAL_TABLET | Freq: Two times a day (BID) | ORAL | Status: DC

## 2013-10-27 MED ORDER — HYDROCOD POLST-CHLORPHEN POLST 10-8 MG/5ML PO LQCR: 5.0000 mL | Freq: Two times a day (BID) | ORAL | Status: DC | PRN

## 2013-10-27 NOTE — Progress Notes (Signed)
Patient presents to clinic today c/o productive cough, rhinorrhea and postnasal drip x 3 days.  Endorses intermittent fevers, right ear pain and fatigue.  Denies shortness of breath or pleuritic chest pain. Denies recent travel or sick contact.  Is taking Flonase daily for seasonal allergies.  Past Medical History  Diagnosis Date  . Hyperlipidemia   . Allergic rhinitis   . Supraspinatus tendon tear   . Colon polyps     adenomatous 03/2009  . GERD (gastroesophageal reflux disease)   . Family history of ischemic heart disease   . Other abnormal glucose     A1c 6% in 06/2009    Current Outpatient Prescriptions on File Prior to Visit  Medication Sig Dispense Refill  . atorvastatin (LIPITOR) 40 MG tablet Take 40 mg by mouth daily.      . Coenzyme Q10 (CO Q-10) 100 MG CAPS Take 1 capsule by mouth daily.      Marland Kitchen dexlansoprazole (DEXILANT) 60 MG capsule Take 1 capsule (60 mg total) by mouth daily.  30 capsule  5  . ezetimibe (ZETIA) 10 MG tablet Take 10 mg by mouth daily.      . Flaxseed, Linseed, (FLAX SEED OIL PO) Take by mouth.      . fluticasone (FLONASE) 50 MCG/ACT nasal spray PLACE 2 SPRAYS INTO THE NOSE DAILY. AS NEEDED  16 g  5  . hydrocortisone-pramoxine (ANALPRAM-HC) 2.5-1 % rectal cream PLACE RECTALLY 2 (TWO) TIMES DAILY.  30 g  3  . Multiple Vitamin (MULTIVITAMIN) tablet Take 1 tablet by mouth daily.      . Omega-3 Fatty Acids (FISH OIL) 500 MG CAPS Take 1 capsule by mouth daily.      . Psyllium (METAMUCIL PO) Take 2 capsules by mouth daily.      . [DISCONTINUED] Lansoprazole (PREVACID PO) Take by mouth daily.        . [DISCONTINUED] omeprazole (PRILOSEC) 40 MG capsule Take 1 capsule (40 mg total) by mouth daily.  180 capsule  1   No current facility-administered medications on file prior to visit.    Allergies  Allergen Reactions  . Aspirin     REACTION: hives Because of a history of documented adverse serious drug reaction;Medi Alert bracelet  is recommended  . Other       Walnuts & pecans cause hives  . Ezetimibe-Simvastatin Hives    REACTION: myalgia    Family History  Problem Relation Age of Onset  . Hypertension Mother   . Diabetes Mother   . Stroke Mother     in 14s  . Diabetes Father   . Hypertension Father   . Heart attack Father     had open heart surgery  . Diabetes Brother     X2  . Heart attack Brother      X 2 had MI in 79s  . Stroke Brother     in 39s  . Kidney failure Brother   . Hypertension Brother   . Diabetes Sister      X 2  . Hypertension Sister   . COPD Neg Hx   . Asthma Neg Hx   . Cancer Neg Hx   . Colon polyps Neg Hx   . Kidney failure Brother     renal transplant  . Diabetes Brother     History   Social History  . Marital Status: Married    Spouse Name: N/A    Number of Children: 1  . Years of Education: N/A  Occupational History  . manager of hotels    . OWNER    Social History Main Topics  . Smoking status: Never Smoker   . Smokeless tobacco: Never Used  . Alcohol Use: 8.4 oz/week    14 Glasses of wine per week     Comment: two glasses of wine with dinner every night  . Drug Use: No  . Sexual Activity: None   Other Topics Concern  . None   Social History Narrative   Never smoked   Regular diet   Does exercise 2x week   Coffee 1 daily   Review of Systems - See HPI.  All other ROS are negative.  BP 128/78  Pulse 79  Temp(Src) 98.1 F (36.7 C) (Oral)  Resp 16  Ht 5\' 6"  (1.676 m)  Wt 149 lb (67.586 kg)  BMI 24.06 kg/m2  SpO2 99%  Physical Exam  Vitals reviewed. Constitutional: He is oriented to person, place, and time and well-developed, well-nourished, and in no distress.  HENT:  Head: Normocephalic and atraumatic.  Right Ear: External ear and ear canal normal. Tympanic membrane is erythematous and bulging.  Left Ear: Tympanic membrane, external ear and ear canal normal.  Nose: Rhinorrhea present. Right sinus exhibits no maxillary sinus tenderness and no frontal sinus  tenderness. Left sinus exhibits no maxillary sinus tenderness and no frontal sinus tenderness.  Mouth/Throat: Uvula is midline, oropharynx is clear and moist and mucous membranes are normal. No oropharyngeal exudate.  Eyes: Conjunctivae are normal. Pupils are equal, round, and reactive to light.  Neck: Neck supple.  Cardiovascular: Normal rate, regular rhythm, normal heart sounds and intact distal pulses.   Pulmonary/Chest: Breath sounds normal. No respiratory distress. He has no wheezes. He has no rales. He exhibits no tenderness.  Lymphadenopathy:    He has no cervical adenopathy.  Neurological: He is alert and oriented to person, place, and time.  Skin: Skin is warm and dry. No rash noted.  Psychiatric: Affect normal.   Assessment/Plan: Right otitis media with effusion Rx Augmentin.    Acute bronchitis Likely viral in nature.  Also with bacterial AOM for which Augmentin was given.  Increase fluids.  Rest.  Saline nasal spray.  Rx Tussionex for cough. Mucinex.  Humidifier in bedroom.  Return precautions discussed with patient.

## 2013-10-27 NOTE — Assessment & Plan Note (Signed)
Rx Augmentin

## 2013-10-27 NOTE — Progress Notes (Signed)
Pre visit review using our clinic review tool, if applicable. No additional management support is needed unless otherwise documented below in the visit note/SLS  

## 2013-10-27 NOTE — Patient Instructions (Signed)
Please take antibiotic as directed.  Increase fluid intake.  Rest.  Use Cough syrup as directed.  Continue Flonase.  Place a humidifier in bedroom.  Otitis Media Otitis media is redness, soreness, and puffiness (swelling) in the space just behind your eardrum (middle ear). It may be caused by allergies or infection. It often happens along with a cold. HOME CARE  Take your medicine as told. Finish it even if you start to feel better.  Only take over-the-counter or prescription medicines for pain, discomfort, or fever as told by your doctor.  Follow up with your doctor as told. GET HELP IF:  You have otitis media only in one ear, or bleeding from your nose, or both.  You notice a lump on your neck.  You are not getting better in 3-5 days.  You feel worse instead of better. GET HELP RIGHT AWAY IF:   You have pain that is not helped with medicine.  You have puffiness, redness, or pain around your ear.  You get a stiff neck.  You cannot move part of your face (paralysis).  You notice that the bone behind your ear hurts when you touch it. MAKE SURE YOU:   Understand these instructions.  Will watch your condition.  Will get help right away if you are not doing well or get worse. Document Released: 07/17/2007 Document Revised: 02/02/2013 Document Reviewed: 08/25/2012 [facility] Patient Information 2015 [place], [place]. This information is not intended to replace advice given to you by your health care provider. Make sure you discuss any questions you have with your health care provider.  Acute Bronchitis Bronchitis is inflammation of the airways that extend from the windpipe into the lungs (bronchi). The inflammation often causes mucus to develop. This leads to a cough, which is the most common symptom of bronchitis.  In acute bronchitis, the condition usually develops suddenly and goes away over time, usually in a couple weeks. Smoking, allergies, and asthma can make bronchitis  worse. Repeated episodes of bronchitis may cause further lung problems.  CAUSES Acute bronchitis is most often caused by the same virus that causes a cold. The virus can spread from person to person (contagious) through coughing, sneezing, and touching contaminated objects. SIGNS AND SYMPTOMS   Cough.   Fever.   Coughing up mucus.   Body aches.   Chest congestion.   Chills.   Shortness of breath.   Sore throat.  DIAGNOSIS  Acute bronchitis is usually diagnosed through a physical exam. Your health care provider will also ask you questions about your medical history. Tests, such as chest X-rays, are sometimes done to rule out other conditions.  TREATMENT  Acute bronchitis usually goes away in a couple weeks. Oftentimes, no medical treatment is necessary. Medicines are sometimes given for relief of fever or cough. Antibiotic medicines are usually not needed but may be prescribed in certain situations. In some cases, an inhaler may be recommended to help reduce shortness of breath and control the cough. A cool mist vaporizer may also be used to help thin bronchial secretions and make it easier to clear the chest.  HOME CARE INSTRUCTIONS  Get plenty of rest.   Drink enough fluids to keep your urine clear or pale yellow (unless you have a medical condition that requires fluid restriction). Increasing fluids may help thin your respiratory secretions (sputum) and reduce chest congestion, and it will prevent dehydration.   Take medicines only as directed by your health care provider.  If you were prescribed an  antibiotic medicine, finish it all even if you start to feel better.  Avoid smoking and secondhand smoke. Exposure to cigarette smoke or irritating chemicals will make bronchitis worse. If you are a smoker, consider using nicotine gum or skin patches to help control withdrawal symptoms. Quitting smoking will help your lungs heal faster.   Reduce the chances of another bout  of acute bronchitis by washing your hands frequently, avoiding people with cold symptoms, and trying not to touch your hands to your mouth, nose, or eyes.   Keep all follow-up visits as directed by your health care provider.  SEEK MEDICAL CARE IF: Your symptoms do not improve after 1 week of treatment.  SEEK IMMEDIATE MEDICAL CARE IF:  You develop an increased fever or chills.   You have chest pain.   You have severe shortness of breath.  You have bloody sputum.   You develop dehydration.  You faint or repeatedly feel like you are going to pass out.  You develop repeated vomiting.  You develop a severe headache. MAKE SURE YOU:   Understand these instructions.  Will watch your condition.  Will get help right away if you are not doing well or get worse. Document Released: 03/07/2004 Document Revised: 06/14/2013 Document Reviewed: 07/21/2012 [facility] Patient Information 2015 [place], [place]. This information is not intended to replace advice given to you by your health care provider. Make sure you discuss any questions you have with your health care provider.

## 2013-10-27 NOTE — Assessment & Plan Note (Signed)
Likely viral in nature.  Also with bacterial AOM for which Augmentin was given.  Increase fluids.  Rest.  Saline nasal spray.  Rx Tussionex for cough. Mucinex.  Humidifier in bedroom.  Return precautions discussed with patient.

## 2013-11-03 ENCOUNTER — Ambulatory Visit: Payer: BC Managed Care – PPO | Admitting: Internal Medicine

## 2013-11-03 DIAGNOSIS — R4689 Other symptoms and signs involving appearance and behavior: Secondary | ICD-10-CM | POA: Insufficient documentation

## 2013-11-03 DIAGNOSIS — Z0289 Encounter for other administrative examinations: Secondary | ICD-10-CM

## 2013-11-05 ENCOUNTER — Encounter: Payer: BC Managed Care – PPO | Admitting: Internal Medicine

## 2013-11-09 ENCOUNTER — Encounter: Payer: Self-pay | Admitting: Internal Medicine

## 2013-11-09 ENCOUNTER — Ambulatory Visit (INDEPENDENT_AMBULATORY_CARE_PROVIDER_SITE_OTHER): Payer: BC Managed Care – PPO | Admitting: Internal Medicine

## 2013-11-09 ENCOUNTER — Other Ambulatory Visit (INDEPENDENT_AMBULATORY_CARE_PROVIDER_SITE_OTHER): Payer: BC Managed Care – PPO

## 2013-11-09 VITALS — BP 140/96 | HR 72 | Temp 98.2°F | Resp 12 | Wt 145.4 lb

## 2013-11-09 DIAGNOSIS — Z0000 Encounter for general adult medical examination without abnormal findings: Secondary | ICD-10-CM

## 2013-11-09 DIAGNOSIS — E785 Hyperlipidemia, unspecified: Secondary | ICD-10-CM

## 2013-11-09 DIAGNOSIS — Z86010 Personal history of colonic polyps: Secondary | ICD-10-CM

## 2013-11-09 DIAGNOSIS — R7309 Other abnormal glucose: Secondary | ICD-10-CM

## 2013-11-09 DIAGNOSIS — R7303 Prediabetes: Secondary | ICD-10-CM

## 2013-11-09 LAB — CBC WITH DIFFERENTIAL/PLATELET
Basophils Absolute: 0 10*3/uL (ref 0.0–0.1)
Basophils Relative: 0.4 % (ref 0.0–3.0)
Eosinophils Absolute: 0.3 10*3/uL (ref 0.0–0.7)
Eosinophils Relative: 4.1 % (ref 0.0–5.0)
HCT: 40.6 % (ref 39.0–52.0)
Hemoglobin: 13.7 g/dL (ref 13.0–17.0)
Lymphocytes Relative: 28.2 % (ref 12.0–46.0)
Lymphs Abs: 1.9 10*3/uL (ref 0.7–4.0)
MCHC: 33.7 g/dL (ref 30.0–36.0)
MCV: 82.3 fl (ref 78.0–100.0)
Monocytes Absolute: 0.5 10*3/uL (ref 0.1–1.0)
Monocytes Relative: 7.8 % (ref 3.0–12.0)
Neutro Abs: 4 10*3/uL (ref 1.4–7.7)
Neutrophils Relative %: 59.5 % (ref 43.0–77.0)
Platelets: 223 10*3/uL (ref 150.0–400.0)
RBC: 4.94 Mil/uL (ref 4.22–5.81)
RDW: 13.6 % (ref 11.5–15.5)
WBC: 6.7 10*3/uL (ref 4.0–10.5)

## 2013-11-09 LAB — HEPATIC FUNCTION PANEL
ALT: 55 U/L — ABNORMAL HIGH (ref 0–53)
AST: 32 U/L (ref 0–37)
Albumin: 4.3 g/dL (ref 3.5–5.2)
Alkaline Phosphatase: 91 U/L (ref 39–117)
Bilirubin, Direct: 0.1 mg/dL (ref 0.0–0.3)
Total Bilirubin: 0.6 mg/dL (ref 0.2–1.2)
Total Protein: 7.6 g/dL (ref 6.0–8.3)

## 2013-11-09 LAB — BASIC METABOLIC PANEL
BUN: 16 mg/dL (ref 6–23)
CO2: 27 mEq/L (ref 19–32)
Calcium: 9.1 mg/dL (ref 8.4–10.5)
Chloride: 105 mEq/L (ref 96–112)
Creatinine, Ser: 1.1 mg/dL (ref 0.4–1.5)
GFR: 75.74 mL/min (ref >60.00)
Glucose, Bld: 103 mg/dL — ABNORMAL HIGH (ref 70–99)
Potassium: 3.4 mEq/L — ABNORMAL LOW (ref 3.5–5.1)
Sodium: 138 mEq/L (ref 135–145)

## 2013-11-09 LAB — CK: Total CK: 96 U/L (ref 7–232)

## 2013-11-09 LAB — LIPID PANEL
Cholesterol: 255 mg/dL — ABNORMAL HIGH (ref 0–200)
HDL: 53.5 mg/dL (ref >39.00)
LDL Cholesterol: 162 mg/dL — ABNORMAL HIGH (ref 0–99)
NonHDL: 201.5
Total CHOL/HDL Ratio: 5
Triglycerides: 196 mg/dL — ABNORMAL HIGH (ref 0.0–149.0)
VLDL: 39.2 mg/dL (ref 0.0–40.0)

## 2013-11-09 LAB — TSH: TSH: 2.04 u[IU]/mL (ref 0.35–4.50)

## 2013-11-09 NOTE — Progress Notes (Signed)
Subjective:    Patient ID: Kevin Farley, male    DOB: 1955-12-06, 58 y.o.   MRN: [id number]  HPI  He is here for a physical;acute issues denied.    He has been compliant with his atorvastatin and ezetimibe since his  lipids were checked in July by his Cardiologist. At that time he had been off his statin 4 months. Lab results 09/07/13 were reviewed. Glucose 125; triglycerides 238; HDL 59; and LDL 212. Liver function tests were normal.  A1c was prediabetic at 6.3%. There is a strong family history of diabetes. Specifically both parents, 2 brothers, and sister have had diabetes.  He is on modified heart healthy diet. He plays badminton for 3 hours 2 times a week without cardio pulmonary symptoms.  He does have some possible memory issues. He will occasionally have myalgias , especially in the neck.        Review of Systems   Chest pain, palpitations, tachycardia, exertional dyspnea, paroxysmal nocturnal dyspnea, claudication or edema are absent.  He has some minor memory issues according his wife as well as some anger issues.      Objective:   Physical Exam  Gen.: Healthy and well-nourished in appearance. Alert, appropriate and cooperative throughout exam. Appears younger than stated age  Head: Normocephalic without obvious abnormalities;  no alopecia  Eyes: No corneal or conjunctival inflammation noted. Pupils equal round reactive to light and accommodation. Extraocular motion intact.  Ears: External  ear exam reveals no significant lesions or deformities. Canals clear .TMs scarred. Hearing is grossly normal bilaterally. Nose: External nasal exam reveals no deformity or inflammation. Nasal mucosa are pink and moist. No lesions or exudates noted.   Mouth: Oral mucosa and oropharynx reveal no lesions or exudates. Teeth in good repair. Neck: No deformities, masses, or tenderness noted. Range of motion & Thyroid normal. Lungs: Normal respiratory effort; chest expands symmetrically.  Lungs are clear to auscultation without rales, wheezes, or increased work of breathing. Heart: Normal rate and rhythm. Normal S1 and S2. No gallop, click, or rub. No murmur. Abdomen: Bowel sounds normal; abdomen soft and nontender. No masses, organomegaly or hernias noted. Genitalia: deferred . PSA < 1.00. DRE 12/15.                     Musculoskeletal/extremities: No deformity or scoliosis noted of  the thoracic or lumbar spine.  No clubbing, cyanosis, edema, or significant extremity  deformity noted. Range of motion normal .Tone & strength normal. Hand joints normal Fingernail health good. Able to lie down & sit up w/o help. Negative SLR bilaterally Vascular: Carotid, radial artery, dorsalis pedis and  posterior tibial pulses are full and equal. No bruits present. Neurologic: Alert and oriented x3. Deep tendon reflexes symmetrical and normal.  Gait normal .      Skin: Intact without suspicious lesions or rashes. Lymph: No cervical, axillary lymphadenopathy present. Psych: Mood and affect are normal despite history as noted. Normally interactive                                                                                        Assessment & Plan:  #  1 comprehensive physical exam; no acute findings #2 anger issues ; resources discussed (Psychology referral)  Plan: see Orders  & Recommendations

## 2013-11-09 NOTE — Progress Notes (Signed)
Pre visit review using our clinic review tool, if applicable. No additional management support is needed unless otherwise documented below in the visit note. 

## 2013-11-09 NOTE — Patient Instructions (Addendum)
The most common cause of elevated triglycerides (TG) is the ingestion of sugar from high fructose corn syrup sources added to processed foods & drinks.  Eat a low-fat diet with lots of fruits and vegetables, up to 7-9 servings per day. Consume less than 40 Grams (preferably ZERO) of sugar per day from foods & drinks with High Fructose Corn Syrup (HFCS) sugar as #1,2,3 or # 4 on label.[employer], [employer] [place] do not carry products with HFCS. Follow a  low carb nutrition program such as [place] or [employer]  to prevent Diabetes . White carbohydrates (potatoes, rice, bread, and pasta) have a high spike of sugar and a high load of sugar. For example a  baked potato has a cup of sugar and a  french fry  2 teaspoons of sugar. Yams, wild  rice, whole grained bread &  wheat pasta have been much lower spike and load of  sugar. Portions should be the size of a deck of cards or your palm.  Your next office appointment will be determined based upon review of your pending labs . Those instructions will be transmitted to you through My Chart.  Minimal Blood Pressure Goal= AVERAGE < 140/90;  Ideal is an AVERAGE < 135/85. This AVERAGE should be calculated from @ least 5-7 BP readings taken @ different times of day on different days of week. You should not respond to isolated BP readings , but rather the AVERAGE for that week .Please bring your  blood pressure cuff to office visits to verify that it is reliable.It  can also be checked against the blood pressure device at the pharmacy. Finger or wrist cuffs are not dependable; an arm cuff is.

## 2013-11-10 DIAGNOSIS — R7303 Prediabetes: Secondary | ICD-10-CM | POA: Insufficient documentation

## 2013-12-30 ENCOUNTER — Other Ambulatory Visit: Payer: Self-pay

## 2013-12-30 MED ORDER — DEXLANSOPRAZOLE 60 MG PO CPDR: 60.0000 mg | DELAYED_RELEASE_CAPSULE | Freq: Every day | ORAL | Status: DC

## 2013-12-30 NOTE — Telephone Encounter (Signed)
Pharmacy sent a fax stating patient wants a 90 day supply

## 2014-05-01 ENCOUNTER — Encounter (HOSPITAL_COMMUNITY): Payer: Self-pay | Admitting: *Deleted

## 2014-05-01 ENCOUNTER — Emergency Department (HOSPITAL_COMMUNITY)
Admission: EM | Admit: 2014-05-01 | Discharge: 2014-05-01 | Disposition: A | Discharge status: Home / Self Care | Payer: BLUE CROSS/BLUE SHIELD | Source: Home / Self Care | Attending: Family Medicine | Admitting: Family Medicine

## 2014-05-01 DIAGNOSIS — L739 Follicular disorder, unspecified: Secondary | ICD-10-CM | POA: Diagnosis not present

## 2014-05-01 MED ORDER — SULFAMETHOXAZOLE-TRIMETHOPRIM 800-160 MG PO TABS: 2.0000 | ORAL_TABLET | Freq: Two times a day (BID) | ORAL | Status: DC

## 2014-05-01 NOTE — ED Provider Notes (Signed)
Author Hatlestad is a 59 y.o. male who presents to Urgent Care today for left axillary tender papules present for 5 days. Pain preceded the rash. No radiating pain fevers chills nausea vomiting or diarrhea. No treatments tried yet.   Past Medical History  Diagnosis Date  . Hyperlipidemia   . Allergic rhinitis   . Supraspinatus tendon tear   . Colon polyps     adenomatous 2011 & 2014  . GERD (gastroesophageal reflux disease)   . Family history of ischemic heart disease   . Other abnormal glucose     A1c 6% in 06/2009   Past Surgical History  Procedure Laterality Date  . Corneal transplant Left 1993    Dr Kathrin Penner  . Back surgery  2006    Dr Carloyn Manner, [place]  . Nasal bone surgery  2007  . Cardic catherization  2005    negative; Dr Lyla Son  . Colon polyps  2011 & 2014    Dr.Jacobs   . Upper gastrointestinal endoscopy  2011    mild gastritis; Dr Ardis Hughs   History  Substance Use Topics  . Smoking status: Never Smoker   . Smokeless tobacco: Never Used  . Alcohol Use: Yes     Comment: occasional   ROS as above Medications: No current facility-administered medications for this encounter.   Current Outpatient Prescriptions  Medication Sig Dispense Refill  . atorvastatin (LIPITOR) 40 MG tablet Take 40 mg by mouth daily.    . Coenzyme Q10 (CO Q-10) 100 MG CAPS Take 1 capsule by mouth daily.    Marland Kitchen dexlansoprazole (DEXILANT) 60 MG capsule Take 1 capsule (60 mg total) by mouth daily. 90 capsule 1  . ezetimibe (ZETIA) 10 MG tablet Take 10 mg by mouth daily.    . Ezetimibe-Simvastatin (VYTORIN PO) Take by mouth.    . Flaxseed, Linseed, (FLAX SEED OIL PO) Take by mouth.    . fluticasone (FLONASE) 50 MCG/ACT nasal spray PLACE 2 SPRAYS INTO THE NOSE DAILY. AS NEEDED 16 g 5  . Multiple Vitamin (MULTIVITAMIN) tablet Take 1 tablet by mouth daily.    . Omega-3 Fatty Acids (FISH OIL) 500 MG CAPS Take 1 capsule by mouth daily.    . Psyllium (METAMUCIL PO) Take 2 capsules by mouth daily.    .  hydrocortisone-pramoxine (ANALPRAM-HC) 2.5-1 % rectal cream PLACE RECTALLY 2 (TWO) TIMES DAILY. 30 g 3  . sulfamethoxazole-trimethoprim (SEPTRA DS) 800-160 MG per tablet Take 2 tablets by mouth 2 (two) times daily. 28 tablet 0  . [DISCONTINUED] Lansoprazole (PREVACID PO) Take by mouth daily.      . [DISCONTINUED] omeprazole (PRILOSEC) 40 MG capsule Take 1 capsule (40 mg total) by mouth daily. 180 capsule 1   Allergies  Allergen Reactions  . Aspirin     REACTION: hives States tolerates IBU & acetaminophen OK  . Other     Walnuts & pecans cause hives  . Codeine     Itching with codeine containing cough syrup 9/15  . Ezetimibe-Simvastatin Hives    REACTION: myalgia     Exam:  BP 128/74 mmHg  Pulse 77  Temp(Src) 97.9 F (36.6 C) (Oral)  Resp 16  SpO2 100% Gen: Well NAD HEENT: EOMI,  MMM Lungs: Normal work of breathing. CTABL Heart: RRR no MRG Abd: NABS, Soft. Nondistended, Nontender Exts: Brisk capillary refill, warm and well perfused.  Skin: Left axillary area erythematous tender papules. No fluctuance or induration palpated. No further skin changes.  No results found for this or any previous visit (  from the past 24 hour(s)). No results found.  Assessment and Plan: 59 y.o. male with left axillary folliculitis is most likely explanation. Shingles is also a possibility however think is less likely as the rash is not totally consistent with shingles rash and it does not radiate. Later treatment with Bactrim.  Discussed warning signs or symptoms. Please see discharge instructions. Patient expresses understanding.     Gregor Hams, MD 05/01/14 [phone]

## 2014-05-01 NOTE — Discharge Instructions (Signed)
Thank you for coming in today.   Folliculitis  Folliculitis is redness, soreness, and swelling (inflammation) of the hair follicles. This condition can occur anywhere on the body. People with weakened immune systems, diabetes, or obesity have a greater risk of getting folliculitis. CAUSES  Bacterial infection. This is the most common cause.  Fungal infection.  Viral infection.  Contact with certain chemicals, especially oils and tars. Long-term folliculitis can result from bacteria that live in the nostrils. The bacteria may trigger multiple outbreaks of folliculitis over time. SYMPTOMS Folliculitis most commonly occurs on the scalp, thighs, legs, back, buttocks, and areas where hair is shaved frequently. An early sign of folliculitis is a small, white or yellow, pus-filled, itchy lesion (pustule). These lesions appear on a red, inflamed follicle. They are usually less than 0.2 inches (5 mm) wide. When there is an infection of the follicle that goes deeper, it becomes a boil or furuncle. A group of closely packed boils creates a larger lesion (carbuncle). Carbuncles tend to occur in hairy, sweaty areas of the body. DIAGNOSIS  Your caregiver can usually tell what is wrong by doing a physical exam. A sample may be taken from one of the lesions and tested in a lab. This can help determine what is causing your folliculitis. TREATMENT  Treatment may include:  Applying warm compresses to the affected areas.  Taking antibiotic medicines orally or applying them to the skin.  Draining the lesions if they contain a large amount of pus or fluid.  Laser hair removal for cases of long-lasting folliculitis. This helps to prevent regrowth of the hair. HOME CARE INSTRUCTIONS  Apply warm compresses to the affected areas as directed by your caregiver.  If antibiotics are prescribed, take them as directed. Finish them even if you start to feel better.  You may take over-the-counter medicines to  relieve itching.  Do not shave irritated skin.  Follow up with your caregiver as directed. SEEK IMMEDIATE MEDICAL CARE IF:   You have increasing redness, swelling, or pain in the affected area.  You have a fever. MAKE SURE YOU:  Understand these instructions.  Will watch your condition.  Will get help right away if you are not doing well or get worse. Document Released: 04/08/2001 Document Revised: 07/30/2011 Document Reviewed: 04/30/2011 [facility] Patient Information 2015 [employer], [place]. This information is not intended to replace advice given to you by your health care provider. Make sure you discuss any questions you have with your health care provider.

## 2014-05-01 NOTE — ED Notes (Signed)
C/O "bumps" to left axilla x 5-6 days.  Denies fevers.  States may have had something similar - "many, many yrs ago".

## 2014-06-02 ENCOUNTER — Encounter: Payer: Self-pay | Admitting: Internal Medicine

## 2014-06-02 ENCOUNTER — Ambulatory Visit (INDEPENDENT_AMBULATORY_CARE_PROVIDER_SITE_OTHER): Payer: BLUE CROSS/BLUE SHIELD | Admitting: Internal Medicine

## 2014-06-02 VITALS — BP 132/80 | HR 93 | Temp 98.5°F | Ht 66.0 in | Wt 146.0 lb

## 2014-06-02 DIAGNOSIS — J3089 Other allergic rhinitis: Secondary | ICD-10-CM

## 2014-06-02 DIAGNOSIS — J209 Acute bronchitis, unspecified: Secondary | ICD-10-CM | POA: Diagnosis not present

## 2014-06-02 MED ORDER — BENZONATATE 200 MG PO CAPS: 200.0000 mg | ORAL_CAPSULE | Freq: Three times a day (TID) | ORAL | Status: DC | PRN

## 2014-06-02 MED ORDER — AZITHROMYCIN 250 MG PO TABS: ORAL_TABLET | ORAL | Status: DC

## 2014-06-02 NOTE — Patient Instructions (Signed)

## 2014-06-02 NOTE — Progress Notes (Signed)
   Subjective:    Patient ID: Kevin Farley, male    DOB: [birth year], 59 y.o.   MRN: [id number]  HPI   His symptoms began 05/26/14 as itchy/watery eyes; ear pressure; and nonproductive cough. He has now developed a cough productive of yellow/green sputum. He is also notes wheezing with the cough. Robitussin & Mucinex DM have not been of significant benefit.  At this time he has some scratchy throat but no other upper respiratory tract infection symptoms.   Review of Systems Frontal headache, facial pain , nasal purulence, dental pain, , otic pain or otic discharge denied. No fever , chills or sweats.     Objective:   Physical Exam  General appearance:Adequately nourished; no acute distress or increased work of breathing is present.    Lymphatic: No  lymphadenopathy about the head, neck, or axilla .  Eyes: No conjunctival inflammation or lid edema is present. There is no scleral icterus.  Ears:  External ear exam shows no significant lesions or deformities.  Otoscopic examination reveals clear canals, tympanic membranes are intact bilaterally without bulging, retraction, inflammation or discharge.  Nose:  External nasal examination shows no deformity or inflammation. Nasal mucosa are erythematous without lesions or exudates. This is more noticeable on the right nasal septum than the left. No septal dislocation or deviation.No obstruction to airflow.   Oral exam: Dental hygiene is good; lips and gums are healthy appearing.There is no oropharyngeal erythema or exudate .  Neck:  No deformities, thyromegaly, masses, or tenderness noted.   Supple with full range of motion without pain.   Heart:  Normal rate and regular rhythm. S1 and S2 normal without gallop, murmur, click, rub or other extra sounds.   Lungs:Chest clear to auscultation; no wheezes, rhonchi,rales ,or rubs present.  Extremities:  No cyanosis, edema, or clubbing  noted    Skin: Warm & dry w/o tenting or jaundice. No  significant lesions or rash.      Assessment & Plan:  #1 acute bronchitis  # 2 nasal inflammation  Plan: See orders and recommendations

## 2014-06-02 NOTE — Progress Notes (Signed)
Pre visit review using our clinic review tool, if applicable. No additional management support is needed unless otherwise documented below in the visit note. 

## 2014-06-06 ENCOUNTER — Other Ambulatory Visit: Payer: Self-pay | Admitting: Internal Medicine

## 2014-06-06 ENCOUNTER — Telehealth: Payer: Self-pay | Admitting: Internal Medicine

## 2014-06-06 DIAGNOSIS — E785 Hyperlipidemia, unspecified: Secondary | ICD-10-CM

## 2014-06-06 DIAGNOSIS — R7303 Prediabetes: Secondary | ICD-10-CM

## 2014-06-06 NOTE — Telephone Encounter (Signed)
Patient 's wife has been notified  

## 2014-06-06 NOTE — Telephone Encounter (Signed)
Dr Linna Darner, what labs is patient to have done?

## 2014-06-06 NOTE — Telephone Encounter (Signed)
See orders please

## 2014-06-06 NOTE — Telephone Encounter (Signed)
Dr. Linna Darner was to put orders in for lab after appt last week and they are not there. Could you please put orders in and call patients wife.

## 2014-06-08 ENCOUNTER — Other Ambulatory Visit: Payer: Self-pay | Admitting: Internal Medicine

## 2014-06-08 ENCOUNTER — Telehealth: Payer: Self-pay | Admitting: Internal Medicine

## 2014-06-08 MED ORDER — AMOXICILLIN 500 MG PO CAPS: 500.0000 mg | ORAL_CAPSULE | Freq: Three times a day (TID) | ORAL | Status: DC

## 2014-06-08 NOTE — Telephone Encounter (Signed)
Rx sent to [facility]

## 2014-06-08 NOTE — Telephone Encounter (Signed)
Patient 's wife has been notified  

## 2014-06-08 NOTE — Telephone Encounter (Signed)
Would like to see if patient can get a different antibiotic.  States patient was given zpak but did not clear up symptoms.  States patient is still congested and that zpaks do not usually work for him.

## 2014-06-10 ENCOUNTER — Ambulatory Visit (INDEPENDENT_AMBULATORY_CARE_PROVIDER_SITE_OTHER): Payer: BLUE CROSS/BLUE SHIELD | Admitting: Internal Medicine

## 2014-06-10 ENCOUNTER — Encounter: Payer: Self-pay | Admitting: Internal Medicine

## 2014-06-10 ENCOUNTER — Ambulatory Visit (INDEPENDENT_AMBULATORY_CARE_PROVIDER_SITE_OTHER)
Admission: RE | Admit: 2014-06-10 | Discharge: 2014-06-10 | Disposition: A | Discharge status: Home / Self Care | Payer: BLUE CROSS/BLUE SHIELD | Source: Ambulatory Visit | Attending: Internal Medicine | Admitting: Internal Medicine

## 2014-06-10 VITALS — BP 124/80 | HR 87

## 2014-06-10 DIAGNOSIS — J209 Acute bronchitis, unspecified: Secondary | ICD-10-CM

## 2014-06-10 MED ORDER — UMECLIDINIUM-VILANTEROL 62.5-25 MCG/INH IN AEPB: 1.0000 | INHALATION_SPRAY | Freq: Every day | RESPIRATORY_TRACT | Status: DC

## 2014-06-10 MED ORDER — HYDROCOD POLST-CPM POLST ER 10-8 MG/5ML PO SUER: 5.0000 mL | Freq: Two times a day (BID) | ORAL | Status: DC | PRN

## 2014-06-10 MED ORDER — LEVOFLOXACIN 500 MG PO TABS: 500.0000 mg | ORAL_TABLET | Freq: Every day | ORAL | Status: DC

## 2014-06-10 NOTE — Progress Notes (Signed)
Pre visit review using our clinic review tool, if applicable. No additional management support is needed unless otherwise documented below in the visit note. 

## 2014-06-10 NOTE — Progress Notes (Signed)
   Subjective:    Patient ID: Kevin Farley, male    DOB: [birth year], 59 y.o.   MRN: [id number]  Cough This is a new problem. The current episode started 1 to 4 weeks ago. The cough is productive of sputum. Associated symptoms include wheezing. Pertinent negatives include no chest pain, chills, nasal congestion, rash or sore throat. Nothing aggravates the symptoms. The treatment provided no relief. There is no history of asthma, bronchiectasis, COPD or emphysema.  Wheezing  Associated symptoms include coughing. Pertinent negatives include no chest pain, chills, diarrhea, neck pain, rash or sore throat. There is no history of asthma or COPD.    C/o cough  Review of Systems  Constitutional: Negative for chills, appetite change, fatigue and unexpected weight change.  HENT: Negative for congestion, nosebleeds, sneezing, sore throat and trouble swallowing.   Eyes: Negative for itching and visual disturbance.  Respiratory: Positive for cough and wheezing.   Cardiovascular: Negative for chest pain, palpitations and leg swelling.  Gastrointestinal: Negative for nausea, diarrhea, blood in stool and abdominal distention.  Genitourinary: Negative for frequency and hematuria.  Musculoskeletal: Negative for back pain, joint swelling, gait problem and neck pain.  Skin: Negative for rash.  Neurological: Negative for dizziness, tremors, speech difficulty and weakness.  Psychiatric/Behavioral: Negative for sleep disturbance, dysphoric mood and agitation. The patient is not nervous/anxious.        Objective:   Physical Exam  Constitutional: He is oriented to person, place, and time. He appears well-developed. No distress.  NAD  HENT:  Mouth/Throat: Oropharynx is clear and moist.  Eyes: Conjunctivae are normal. Pupils are equal, round, and reactive to light.  Neck: Normal range of motion. No JVD present. No thyromegaly present.  Cardiovascular: Normal rate, regular rhythm, normal heart sounds and intact  distal pulses.  Exam reveals no gallop and no friction rub.   No murmur heard. Pulmonary/Chest: Effort normal and breath sounds normal. No respiratory distress. He has no wheezes. He has no rales. He exhibits no tenderness.  Abdominal: Soft. Bowel sounds are normal. He exhibits no distension and no mass. There is no tenderness. There is no rebound and no guarding.  Musculoskeletal: Normal range of motion. He exhibits no edema or tenderness.  Lymphadenopathy:    He has no cervical adenopathy.  Neurological: He is alert and oriented to person, place, and time. He has normal reflexes. No cranial nerve deficit. He exhibits normal muscle tone. He displays a negative Romberg sign. Coordination and gait normal.  Skin: Skin is warm and dry. No rash noted.  Psychiatric: He has a normal mood and affect. His behavior is normal. Judgment and thought content normal.      I personally provided Anoro inhaler use teaching. After the teaching patient was able to demonstrate it's use effectively. All questions were answered     Assessment & Plan:

## 2014-06-10 NOTE — Assessment & Plan Note (Signed)
Tussionex syr Levaquin CXR

## 2014-06-29 ENCOUNTER — Emergency Department (HOSPITAL_BASED_OUTPATIENT_CLINIC_OR_DEPARTMENT_OTHER)
Admission: EM | Admit: 2014-06-29 | Discharge: 2014-06-29 | Disposition: A | Discharge status: Home / Self Care | Payer: BLUE CROSS/BLUE SHIELD | Attending: Emergency Medicine | Admitting: Emergency Medicine

## 2014-06-29 ENCOUNTER — Emergency Department (HOSPITAL_BASED_OUTPATIENT_CLINIC_OR_DEPARTMENT_OTHER): Payer: BLUE CROSS/BLUE SHIELD

## 2014-06-29 ENCOUNTER — Encounter (HOSPITAL_BASED_OUTPATIENT_CLINIC_OR_DEPARTMENT_OTHER): Payer: Self-pay | Admitting: Emergency Medicine

## 2014-06-29 DIAGNOSIS — Z9889 Other specified postprocedural states: Secondary | ICD-10-CM | POA: Diagnosis not present

## 2014-06-29 DIAGNOSIS — Z7951 Long term (current) use of inhaled steroids: Secondary | ICD-10-CM | POA: Diagnosis not present

## 2014-06-29 DIAGNOSIS — Z7952 Long term (current) use of systemic steroids: Secondary | ICD-10-CM | POA: Diagnosis not present

## 2014-06-29 DIAGNOSIS — Z87828 Personal history of other (healed) physical injury and trauma: Secondary | ICD-10-CM | POA: Insufficient documentation

## 2014-06-29 DIAGNOSIS — Z79899 Other long term (current) drug therapy: Secondary | ICD-10-CM | POA: Diagnosis not present

## 2014-06-29 DIAGNOSIS — Z86010 Personal history of colonic polyps: Secondary | ICD-10-CM | POA: Diagnosis not present

## 2014-06-29 DIAGNOSIS — J36 Peritonsillar abscess: Secondary | ICD-10-CM | POA: Diagnosis not present

## 2014-06-29 DIAGNOSIS — Z792 Long term (current) use of antibiotics: Secondary | ICD-10-CM | POA: Diagnosis not present

## 2014-06-29 DIAGNOSIS — E785 Hyperlipidemia, unspecified: Secondary | ICD-10-CM | POA: Insufficient documentation

## 2014-06-29 DIAGNOSIS — R070 Pain in throat: Secondary | ICD-10-CM

## 2014-06-29 DIAGNOSIS — K219 Gastro-esophageal reflux disease without esophagitis: Secondary | ICD-10-CM | POA: Diagnosis not present

## 2014-06-29 DIAGNOSIS — J029 Acute pharyngitis, unspecified: Secondary | ICD-10-CM | POA: Diagnosis present

## 2014-06-29 LAB — CBC WITH DIFFERENTIAL/PLATELET
Basophils Absolute: 0 10*3/uL (ref 0.0–0.1)
Basophils Relative: 0 % (ref 0–1)
Eosinophils Absolute: 0.2 10*3/uL (ref 0.0–0.7)
Eosinophils Relative: 2 % (ref 0–5)
HCT: 39.8 % (ref 39.0–52.0)
Hemoglobin: 13 g/dL (ref 13.0–17.0)
Lymphocytes Relative: 16 % (ref 12–46)
Lymphs Abs: 1.5 10*3/uL (ref 0.7–4.0)
MCH: 27.4 pg (ref 26.0–34.0)
MCHC: 32.7 g/dL (ref 30.0–36.0)
MCV: 83.8 fL (ref 78.0–100.0)
Monocytes Absolute: 0.7 10*3/uL (ref 0.1–1.0)
Monocytes Relative: 8 % (ref 3–12)
Neutro Abs: 6.7 10*3/uL (ref 1.7–7.7)
Neutrophils Relative %: 74 % (ref 43–77)
Platelets: 160 10*3/uL (ref 150–400)
RBC: 4.75 MIL/uL (ref 4.22–5.81)
RDW: 13.9 % (ref 11.5–15.5)
WBC: 9.1 10*3/uL (ref 4.0–10.5)

## 2014-06-29 LAB — BASIC METABOLIC PANEL
Anion gap: 8 (ref 5–15)
BUN: 16 mg/dL (ref 6–20)
CO2: 28 mmol/L (ref 22–32)
Calcium: 9.1 mg/dL (ref 8.9–10.3)
Chloride: 104 mmol/L (ref 101–111)
Creatinine, Ser: 1.02 mg/dL (ref 0.61–1.24)
GFR calc Af Amer: >60 mL/min (ref >60)
GFR calc non Af Amer: >60 mL/min (ref >60)
Glucose, Bld: 119 mg/dL — ABNORMAL HIGH (ref 65–99)
Potassium: 3.9 mmol/L (ref 3.5–5.1)
Sodium: 140 mmol/L (ref 135–145)

## 2014-06-29 MED ORDER — DEXAMETHASONE SODIUM PHOSPHATE 10 MG/ML IJ SOLN
20.0000 mg | Freq: Once | INTRAMUSCULAR | Status: AC
  Administered 2014-06-29: 20 mg via INTRAVENOUS
  Filled 2014-06-29: qty 2

## 2014-06-29 MED ORDER — HYDROMORPHONE HCL 1 MG/ML IJ SOLN
1.0000 mg | Freq: Once | INTRAMUSCULAR | Status: AC
Start: 2014-06-29 — End: 2014-06-29
  Administered 2014-06-29: 1 mg via INTRAVENOUS
  Filled 2014-06-29: qty 1

## 2014-06-29 MED ORDER — PREDNISONE 10 MG PO TABS: 20.0000 mg | ORAL_TABLET | Freq: Two times a day (BID) | ORAL | Status: DC

## 2014-06-29 MED ORDER — OXYCODONE-ACETAMINOPHEN 5-325 MG PO TABS: 1.0000 | ORAL_TABLET | Freq: Four times a day (QID) | ORAL | Status: DC | PRN

## 2014-06-29 MED ORDER — CLINDAMYCIN PHOSPHATE 900 MG/50ML IV SOLN
900.0000 mg | Freq: Once | INTRAVENOUS | Status: AC
  Administered 2014-06-29: 900 mg via INTRAVENOUS
  Filled 2014-06-29: qty 50

## 2014-06-29 MED ORDER — IOHEXOL 300 MG/ML  SOLN
75.0000 mL | Freq: Once | INTRAMUSCULAR | Status: AC | PRN
  Administered 2014-06-29: 75 mL via INTRAVENOUS

## 2014-06-29 NOTE — ED Provider Notes (Signed)
CSN: [id number]     Arrival date & time 06/29/14  0805 History   First MD Initiated Contact with Patient 06/29/14 [phone]     Chief Complaint  Patient presents with  . Sore Throat  . Oral Swelling  . Cough     (Consider location/radiation/quality/duration/timing/severity/associated sxs/prior Treatment) HPI Comments: Patient is a 59 year old male with past medical history of high cholesterol. He presents for evaluation of sore throat and chest congestion with productive cough. He states he has been having URI symptoms for the past month and just started his fourth antibiotic yesterday. He started 1 week ago with a sore throat which has gradually worsened, and rapidly worsened over the past 24 hours. He is now having difficulty swallowing and severe pain to the right side of his throat. He was evaluated yesterday by an oral surgeon who thought he may have a dental infection. He was started on clindamycin.  Patient is a 59 y.o. male presenting with pharyngitis. The history is provided by the patient.  Sore Throat This is a new problem. Episode onset: 1 week ago. The problem occurs constantly. The problem has been gradually worsening. The symptoms are aggravated by swallowing, eating and drinking. Nothing relieves the symptoms. He has tried nothing for the symptoms. The treatment provided no relief.    Past Medical History  Diagnosis Date  . Hyperlipidemia   . Allergic rhinitis   . Supraspinatus tendon tear   . Colon polyps     adenomatous 2011 & 2014  . GERD (gastroesophageal reflux disease)   . Family history of ischemic heart disease   . Other abnormal glucose     A1c 6% in 06/2009   Past Surgical History  Procedure Laterality Date  . Corneal transplant Left 1993    Dr Kathrin Penner  . Back surgery  2006    Dr Carloyn Manner, [place]  . Nasal bone surgery  2007  . Cardic catherization  2005    negative; Dr Lyla Son  . Colon polyps  2011 & 2014    Dr.Jacobs   . Upper gastrointestinal endoscopy  2011     mild gastritis; Dr Ardis Hughs   Family History  Problem Relation Age of Onset  . Hypertension Mother   . Diabetes Mother   . Stroke Mother     in 30s  . Diabetes Father   . Hypertension Father   . Heart attack Father     had open heart surgery  . Diabetes Brother     X2  . Heart attack Brother      MI in 8s  . Transient ischemic attack Brother     in 58s  . Kidney failure Brother   . Hypertension Brother   . Diabetes Sister      X 2  . Hypertension Sister   . COPD Neg Hx   . Asthma Neg Hx   . Cancer Neg Hx   . Colon polyps Neg Hx   . Kidney failure Brother     renal transplant  . Diabetes Brother    History  Substance Use Topics  . Smoking status: Never Smoker   . Smokeless tobacco: Never Used  . Alcohol Use: Yes     Comment: occasional    Review of Systems  All other systems reviewed and are negative.     Allergies  Aspirin; Other; Codeine; and Ezetimibe-simvastatin  Home Medications   Prior to Admission medications   Medication Sig Start Date End Date Taking? Authorizing Provider  clindamycin (CLEOCIN)  300 MG capsule Take 300 mg by mouth 4 (four) times daily.   Yes Historical Provider, MD  HYDROcodone-acetaminophen (NORCO) 10-325 MG per tablet Take 1 tablet by mouth every 6 (six) hours as needed.   Yes Historical Provider, MD  atorvastatin (LIPITOR) 40 MG tablet Take 40 mg by mouth daily.    Historical Provider, MD  benzonatate (TESSALON) 200 MG capsule Take 1 capsule (200 mg total) by mouth 3 (three) times daily as needed for cough. 06/02/14   Hendricks Limes, MD  chlorpheniramine-HYDROcodone [facility] PENNKINETIC ER) 10-8 MG/5ML SUER Take 5 mLs by mouth every 12 (twelve) hours as needed for cough. 06/10/14   Aleksei Plotnikov V, MD  Coenzyme Q10 (CO Q-10) 100 MG CAPS Take 1 capsule by mouth daily.    Historical Provider, MD  dexlansoprazole (DEXILANT) 60 MG capsule Take 1 capsule (60 mg total) by mouth daily. 12/30/13   Hendricks Limes, MD  ezetimibe  (ZETIA) 10 MG tablet Take 10 mg by mouth daily.    Historical Provider, MD  Ezetimibe-Simvastatin (VYTORIN PO) Take by mouth.    Historical Provider, MD  Flaxseed, Linseed, (FLAX SEED OIL PO) Take by mouth.    Historical Provider, MD  fluticasone (FLONASE) 50 MCG/ACT nasal spray PLACE 2 SPRAYS INTO THE NOSE DAILY. AS NEEDED 06/07/13   Hendricks Limes, MD  hydrocortisone-pramoxine [facility]) 2.5-1 % rectal cream PLACE RECTALLY 2 (TWO) TIMES DAILY. 10/26/13   Hendricks Limes, MD  levofloxacin (LEVAQUIN) 500 MG tablet Take 1 tablet (500 mg total) by mouth daily. 06/10/14   Aleksei Plotnikov V, MD  Multiple Vitamin (MULTIVITAMIN) tablet Take 1 tablet by mouth daily.    Historical Provider, MD  Omega-3 Fatty Acids (FISH OIL) 500 MG CAPS Take 1 capsule by mouth daily.    Historical Provider, MD  Psyllium (METAMUCIL PO) Take 2 capsules by mouth daily.    Historical Provider, MD  Umeclidinium-Vilanterol (ANORO ELLIPTA) 62.5-25 MCG/INH AEPB Inhale 1 Act into the lungs daily. 06/10/14   Aleksei Plotnikov V, MD   BP 146/88 mmHg  Pulse 88  Temp(Src) 98.7 F (37.1 C) (Oral)  Resp 16  Ht 5\' 6"  (1.676 m)  Wt 146 lb (66.225 kg)  BMI 23.58 kg/m2  SpO2 98% Physical Exam  Constitutional: He is oriented to person, place, and time. He appears well-developed and well-nourished. No distress.  HENT:  Head: Normocephalic and atraumatic.  There is tenderness to palpation to the right jaw and submandibular lymph nodes. There is swelling and inflammation of the posterior oropharynx with right sided tonsillar enlargement.  The TMs are clear bilaterally.  Neck: Normal range of motion. Neck supple.  Cardiovascular: Normal rate, regular rhythm and normal heart sounds.   No murmur heard. Pulmonary/Chest: Effort normal and breath sounds normal. No respiratory distress. He has no wheezes. He has no rales.  Musculoskeletal: Normal range of motion.  Lymphadenopathy:    He has no cervical adenopathy.  Neurological: He  is alert and oriented to person, place, and time.  Skin: Skin is warm and dry. He is not diaphoretic.  Nursing note and vitals reviewed.   ED Course  Procedures (including critical care time) Labs Review Labs Reviewed  BASIC METABOLIC PANEL  CBC WITH DIFFERENTIAL/PLATELET    Imaging Review No results found.   EKG Interpretation None      MDM   Final diagnoses:  Throat pain    Patient presents here with complaints of pain in his throat, swelling, and difficulty swallowing. On exam, there is  some unilateral swelling of the posterior oropharynx on the right. CT scan confirms a small peritonsillar abscess with extensive soft tissue swelling with retropharyngeal effusion. I've spoken with Dr. Benjamine Mola regarding this finding. He feels as though IV clindamycin, steroids, and follow-up in 2 days as an outpatient is appropriate.    Veryl Speak, MD 06/29/14 1229

## 2014-06-29 NOTE — ED Notes (Signed)
Pt having sore throat, jaw pain and throat swelling for one week.  Difficulty swallowing since yesterday.  Pt has been seen by PCP, dentist and oral surgeon.  Pt has infection at right jaw line according to oral surgeon.  Pt has been on four different antibiotics.  Pt here due to now coughing up green phlegm and difficulty swallowing.  Fever last night.

## 2014-06-29 NOTE — Discharge Instructions (Signed)
Continue clindamycin as prescribed. Start taking prednisone as prescribed.  Percocet as prescribed as needed for pain.  You are to follow-up with Dr. Benjamine Mola in 2 days. Please call his office this afternoon to arrange this appointment. He is aware of your situation. His contact information has been provided in this discharge summary.   Peritonsillar Abscess A peritonsillar abscess is a collection of pus located in the back of the throat behind the tonsils. It usually occurs when a streptococcal infection of the throat or tonsils spreads into the space around the tonsils. They are almost always caused by the streptococcal germ (bacteria). The treatment of a peritonsillar abscess is most often drainage accomplished by putting a needle into the abscess or cutting (incising) and draining the abscess. This is most often followed with a course of antibiotics. HOME CARE INSTRUCTIONS  If your abscess was drained by your caregiver today, rinse your throat (gargle) with warm salt water four times per day or as needed for comfort. Do not swallow this mixture. Mix 1 teaspoon of salt in 8 ounces of warm water for gargling.  Rest in bed as needed. Resume activities as able.  Apply cold to your neck for pain relief. Fill a plastic bag with ice and wrap it in a towel. Hold the ice on your neck for 20 minutes 4 times per day.  Eat a soft or liquid diet as tolerated while your throat remains sore. Popsicles and ice cream may be good early choices. Drinking plenty of cold fluids will probably be soothing and help take swelling down in between the warm gargles.  Only take over-the-counter or prescription medicines for pain, discomfort, or fever as directed by your caregiver. Do not use aspirin unless directed by your physician. Aspirin slows down the clotting process. It can also cause bleeding from the drainage area if this was needled or incised today.  If antibiotics were prescribed, take them as directed for the  full course of the prescription. Even if you feel you are well, you need to take them. SEEK MEDICAL CARE IF:   You have increased pain, swelling, redness, or drainage in your throat.  You develop signs of infection such as dizziness, headache, lethargy, or generalized feelings of illness.  You have difficulty breathing, swallowing or eating.  You show signs of becoming dehydrated (lightheadedness when standing, decreased urine output, a fast heart rate, or dry mouth and mucous membranes). SEEK IMMEDIATE MEDICAL CARE IF:   You have a fever.  You are coughing up or vomiting blood.  You develop more severe throat pain uncontrolled with medicines or you start to drool.  You develop difficulty breathing, talking, or find it easier to breathe while leaning forward. Document Released: 01/28/2005 Document Revised: 04/22/2011 Document Reviewed: 09/11/2007 [facility] Patient Information 2015 [place], [place]. This information is not intended to replace advice given to you by your health care provider. Make sure you discuss any questions you have with your health care provider.

## 2014-09-22 ENCOUNTER — Encounter: Payer: Self-pay | Admitting: Internal Medicine

## 2014-09-22 ENCOUNTER — Other Ambulatory Visit (INDEPENDENT_AMBULATORY_CARE_PROVIDER_SITE_OTHER): Payer: BLUE CROSS/BLUE SHIELD

## 2014-09-22 ENCOUNTER — Ambulatory Visit (INDEPENDENT_AMBULATORY_CARE_PROVIDER_SITE_OTHER): Payer: BLUE CROSS/BLUE SHIELD | Admitting: Internal Medicine

## 2014-09-22 VITALS — BP 128/84 | HR 78 | Temp 98.3°F | Resp 16 | Wt 140.0 lb

## 2014-09-22 DIAGNOSIS — E785 Hyperlipidemia, unspecified: Secondary | ICD-10-CM

## 2014-09-22 DIAGNOSIS — R7303 Prediabetes: Secondary | ICD-10-CM

## 2014-09-22 DIAGNOSIS — R7309 Other abnormal glucose: Secondary | ICD-10-CM | POA: Diagnosis not present

## 2014-09-22 DIAGNOSIS — E559 Vitamin D deficiency, unspecified: Secondary | ICD-10-CM

## 2014-09-22 LAB — LIPID PANEL
Cholesterol: 274 mg/dL — ABNORMAL HIGH (ref 0–200)
HDL: 69.8 mg/dL (ref >39.00)
LDL Cholesterol: 166 mg/dL — ABNORMAL HIGH (ref 0–99)
NonHDL: 204.54
Total CHOL/HDL Ratio: 4
Triglycerides: 191 mg/dL — ABNORMAL HIGH (ref 0.0–149.0)
VLDL: 38.2 mg/dL (ref 0.0–40.0)

## 2014-09-22 LAB — HEPATIC FUNCTION PANEL
ALT: 40 U/L (ref 0–53)
AST: 25 U/L (ref 0–37)
Albumin: 4.6 g/dL (ref 3.5–5.2)
Alkaline Phosphatase: 65 U/L (ref 39–117)
Bilirubin, Direct: 0.1 mg/dL (ref 0.0–0.3)
Total Bilirubin: 0.6 mg/dL (ref 0.2–1.2)
Total Protein: 7.9 g/dL (ref 6.0–8.3)

## 2014-09-22 LAB — VITAMIN D 25 HYDROXY (VIT D DEFICIENCY, FRACTURES): VITD: 28.27 ng/mL — ABNORMAL LOW (ref 30.00–100.00)

## 2014-09-22 LAB — TSH: TSH: 3.12 u[IU]/mL (ref 0.35–4.50)

## 2014-09-22 LAB — CK: Total CK: 92 U/L (ref 7–232)

## 2014-09-22 LAB — HEMOGLOBIN A1C: Hgb A1c MFr Bld: 5.9 % (ref 4.6–6.5)

## 2014-09-22 NOTE — Progress Notes (Signed)
   Subjective:    Patient ID: Kevin Farley, male    DOB: 01/28/1956, 59 y.o.   MRN: [id number]  HPI The patient is here to assess status of active health conditions.  PMH, FH, & Social History reviewed & updated.No change in [place] as recorded.  He follows a [place] diet. He exercises for 20-30 minutes twice a week as cardio without cardio pulmonary symptoms.  He stopped his Zetia 6 months ago feeling that was causing aching in his elbows, triceps, and shoulders. He has continued his atorvastatin (He was asked twice which agent he D/Ced).  His most recent lipids were 11/09/13. At that time triglycerides 196 and LDL 162. His glucose was 119 in May of this year. Has a strong family history of diabetes in his mother, father, 2 brothers, 1 sister.  He does have some intermittent reflux for which he takes Dexilant.    Review of Systems  Chest pain, palpitations, tachycardia, exertional dyspnea, paroxysmal nocturnal dyspnea, claudication or edema are absent. No unexplained weight loss, abdominal pain, significant dyspepsia, dysphagia, melena, rectal bleeding, or persistently small caliber stools. Dysuria, pyuria, hematuria, frequency, or nocturia  are denied. Change in hair, skin, nails denied. No bowel changes of constipation or diarrhea. No intolerance to heat or cold. He denies numbness, tingling, burning in his extremities. He has no nonhealing lesions of the extremities. He denies any postural hypotension. He has no polydipsia, polyphagia, or polyuria.     Objective:   Physical Exam  Pertinent or positive findings include:Minor crepitus of knees  General appearance :adequately nourished; in no distress.  Eyes: No conjunctival inflammation or scleral icterus is present.  Oral exam:  Lips and gums are healthy appearing.There is no oropharyngeal erythema or exudate noted. Dental hygiene is good.  Heart:  Normal rate and regular rhythm. S1 and S2 normal without gallop, murmur, click,  rub or other extra sounds    Lungs:Chest clear to auscultation; no wheezes, rhonchi,rales ,or rubs present.No increased work of breathing.   Abdomen: bowel sounds normal, soft and non-tender without masses, organomegaly or hernias noted.  No guarding or rebound.   Vascular : all pulses equal ; no bruits present.  Skin:Warm & dry.  Intact without suspicious lesions or rashes ; no tenting  Lymphatic: No lymphadenopathy is noted about the head, neck, axilla.   Neuro: Strength, tone & DTRs normal.          Assessment & Plan:  See Current Assessment & Plan in Problem List under specific Diagnosis

## 2014-09-22 NOTE — Assessment & Plan Note (Signed)
Lipids, LFTs, TSH ,CK 

## 2014-09-22 NOTE — Progress Notes (Signed)
Pre visit review using our clinic review tool, if applicable. No additional management support is needed unless otherwise documented below in the visit note. 

## 2014-09-22 NOTE — Patient Instructions (Signed)
  Your next office appointment will be determined based upon review of your pending labs. Those written interpretation of the lab results and instructions will be transmitted to you by My Chart  Critical results will be called.   Followup as needed for any active or acute issue. Please report any significant change in your symptoms. 

## 2014-10-20 ENCOUNTER — Other Ambulatory Visit: Payer: Self-pay | Admitting: Emergency Medicine

## 2014-10-20 ENCOUNTER — Telehealth: Payer: Self-pay | Admitting: Emergency Medicine

## 2014-10-20 MED ORDER — LORAZEPAM 0.5 MG PO TABS: ORAL_TABLET | ORAL | Status: DC

## 2014-10-20 NOTE — Telephone Encounter (Signed)
Refill request for Lorazepam. Not on current med list. Please advise, last OV 09/22/14

## 2014-10-20 NOTE — Telephone Encounter (Signed)
Lorazepam 0.5 mg q 8-12 hrs prn only #30

## 2014-12-08 ENCOUNTER — Encounter: Payer: Self-pay | Admitting: Internal Medicine

## 2014-12-31 ENCOUNTER — Other Ambulatory Visit: Payer: Self-pay | Admitting: Internal Medicine

## 2015-01-20 ENCOUNTER — Ambulatory Visit (INDEPENDENT_AMBULATORY_CARE_PROVIDER_SITE_OTHER): Payer: BLUE CROSS/BLUE SHIELD | Admitting: Internal Medicine

## 2015-01-20 ENCOUNTER — Encounter: Payer: Self-pay | Admitting: Internal Medicine

## 2015-01-20 VITALS — BP 134/80 | HR 83 | Temp 98.3°F | Ht 66.0 in | Wt 149.0 lb

## 2015-01-20 DIAGNOSIS — R109 Unspecified abdominal pain: Secondary | ICD-10-CM | POA: Diagnosis not present

## 2015-01-20 DIAGNOSIS — Z23 Encounter for immunization: Secondary | ICD-10-CM | POA: Diagnosis not present

## 2015-01-20 DIAGNOSIS — M5416 Radiculopathy, lumbar region: Secondary | ICD-10-CM | POA: Diagnosis not present

## 2015-01-20 DIAGNOSIS — R300 Dysuria: Secondary | ICD-10-CM | POA: Diagnosis not present

## 2015-01-20 LAB — POCT URINALYSIS DIPSTICK
Bilirubin, UA: NEGATIVE
Blood, UA: NEGATIVE
Glucose, UA: NEGATIVE
Ketones, UA: NEGATIVE
Leukocytes, UA: NEGATIVE
Nitrite, UA: NEGATIVE
Protein, UA: NEGATIVE
Spec Grav, UA: 1.015
Urobilinogen, UA: 0.2
pH, UA: 6

## 2015-01-20 MED ORDER — CYCLOBENZAPRINE HCL 5 MG PO TABS: ORAL_TABLET | ORAL | Status: DC

## 2015-01-20 NOTE — Progress Notes (Signed)
   Subjective:    Patient ID: Kevin Farley, male    DOB: Feb 01, 1956, 59 y.o.   MRN: [id number]  HPI He has been experiencing left flank pain with some radiation anteriorly beginning one week ago. There was no trigger or injury for this. He is physically active working with multiple horses.  The pain is described as dull up to level V usually. With bending or mobilization it is a 7.  He did have some dysuria last week but has no other active GI or GU symptoms at this time.  He did have  back surgery in 2006 by Dr. Carloyn Manner, neurosurgeon.   Review of Systems Unexplained weight loss, abdominal pain, significant dyspepsia, dysphagia, melena, rectal bleeding, or persistently small caliber stools are denied. Dysuria, pyuria, hematuria, frequency, nocturia or polyuria are denied @ present    Objective:   Physical Exam  Pertinent or positive findings include: Gait, heel and toe walking are normal. He has slight discomfort in the left flank area with straight leg raising.  General appearance :adequately nourished; in no distress.  Eyes: No conjunctival inflammation or scleral icterus is present.  Heart:  Normal rate and regular rhythm. S1 and S2 normal without gallop, murmur, click, rub or other extra sounds    Lungs:Chest clear to auscultation; no wheezes, rhonchi,rales ,or rubs present.No increased work of breathing.   Abdomen: bowel sounds normal, soft and non-tender without masses, organomegaly or hernias noted.  No guarding or rebound. No flank tenderness to percussion.  Vascular : all pulses equal ; no bruits present.  Skin:Warm & dry.  Intact without suspicious lesions or rashes ; no tenting or jaundice   Lymphatic: No lymphadenopathy is noted about the head, neck, axilla.   Neuro: Strength, tone & DTRs normal.Heel and toe walking normal.     Assessment & Plan:  #1 acute low back pain manifesting as flank pain without neurologic deficit.UA WNL.  Plan: See orders and  recommendations

## 2015-01-20 NOTE — Patient Instructions (Addendum)
The best exercises for the low back include freestyle swimming, stretch aerobics, and yoga. Use an anti-inflammatory cream such as  Zostrix cream twice a day to the affected area as needed. In lieu of this warm moist compresses or  hot water bottle can be used. Do not apply ice. Chiropractory or physical therapy can help relieve the pain & increase mobility if symptoms persist.

## 2015-01-20 NOTE — Progress Notes (Signed)
Pre visit review using our clinic review tool, if applicable. No additional management support is needed unless otherwise documented below in the visit note. 

## 2015-01-30 IMAGING — CR DG CHEST 2V
2 series · 2 of 2 positions shown · non-contrast
Comparison: 10/16/2011

CLINICAL DATA: History of left lung nodule.  Night sweats.

EXAM:
CHEST  2 VIEW

[view not recorded (1 of 2)]
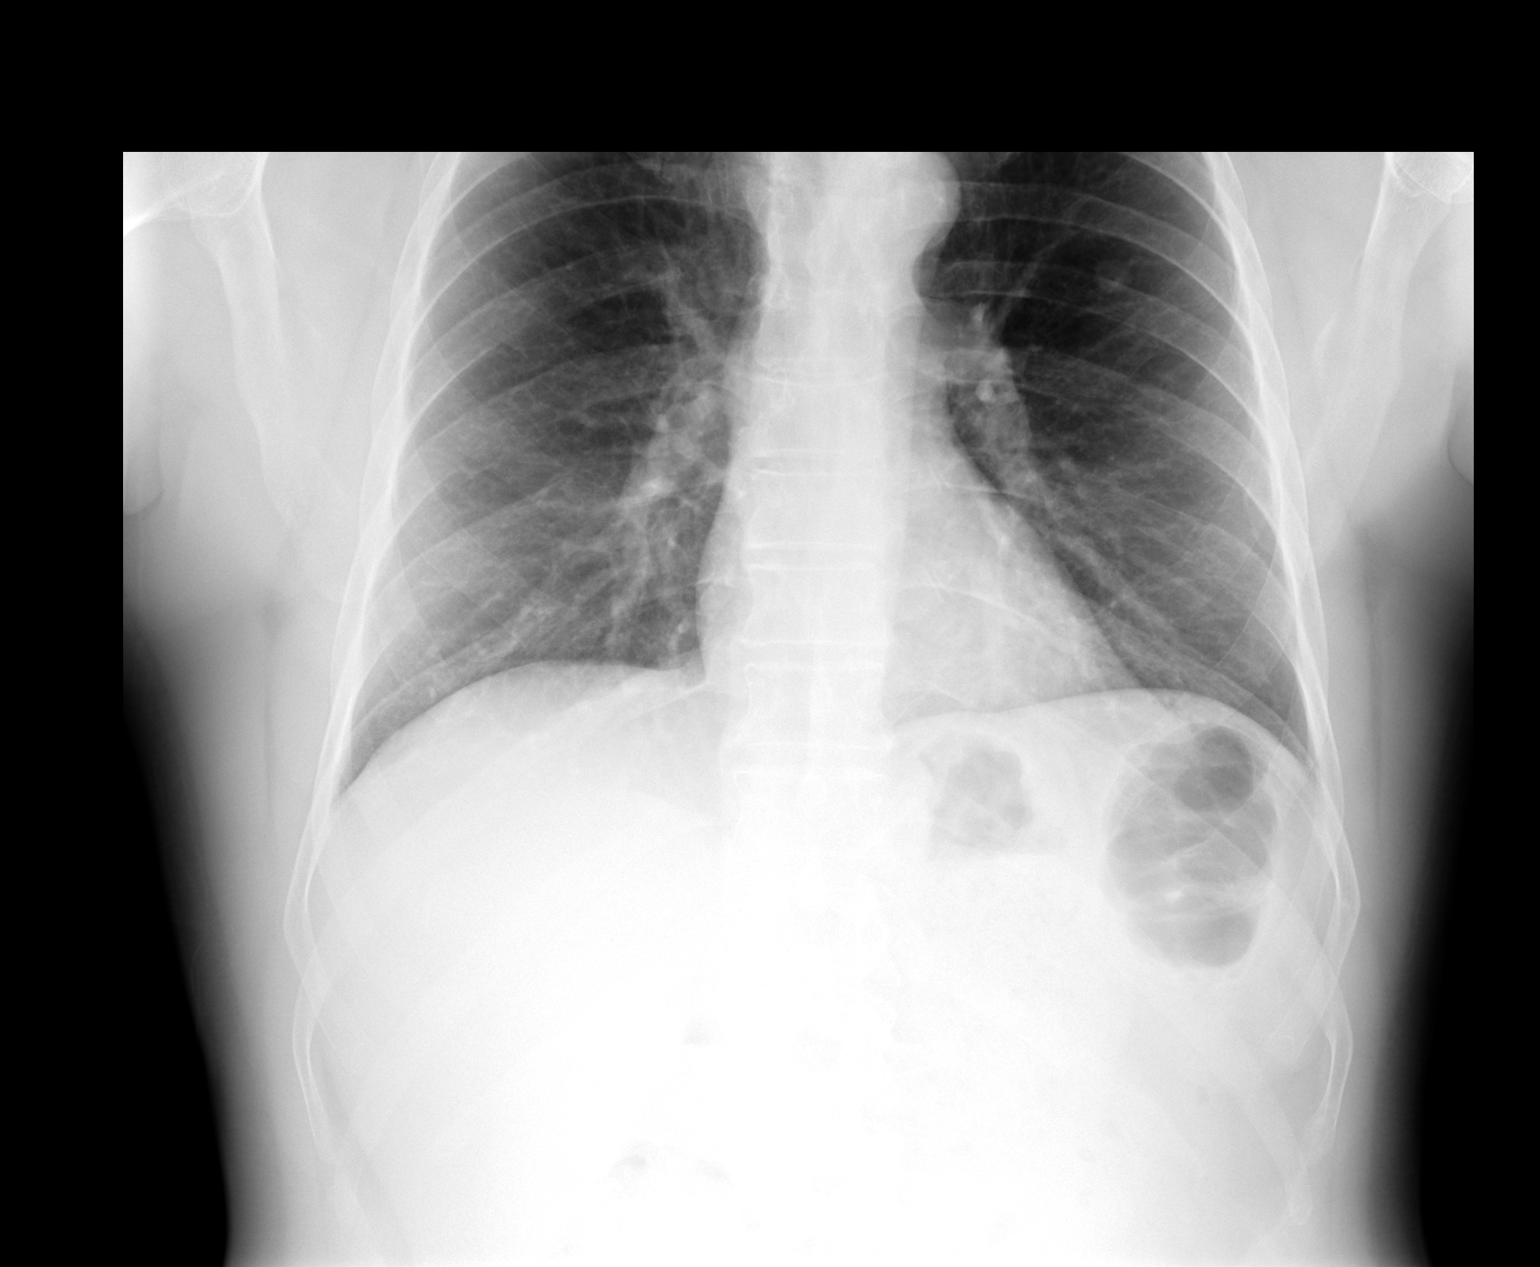

[view not recorded (2 of 2)]
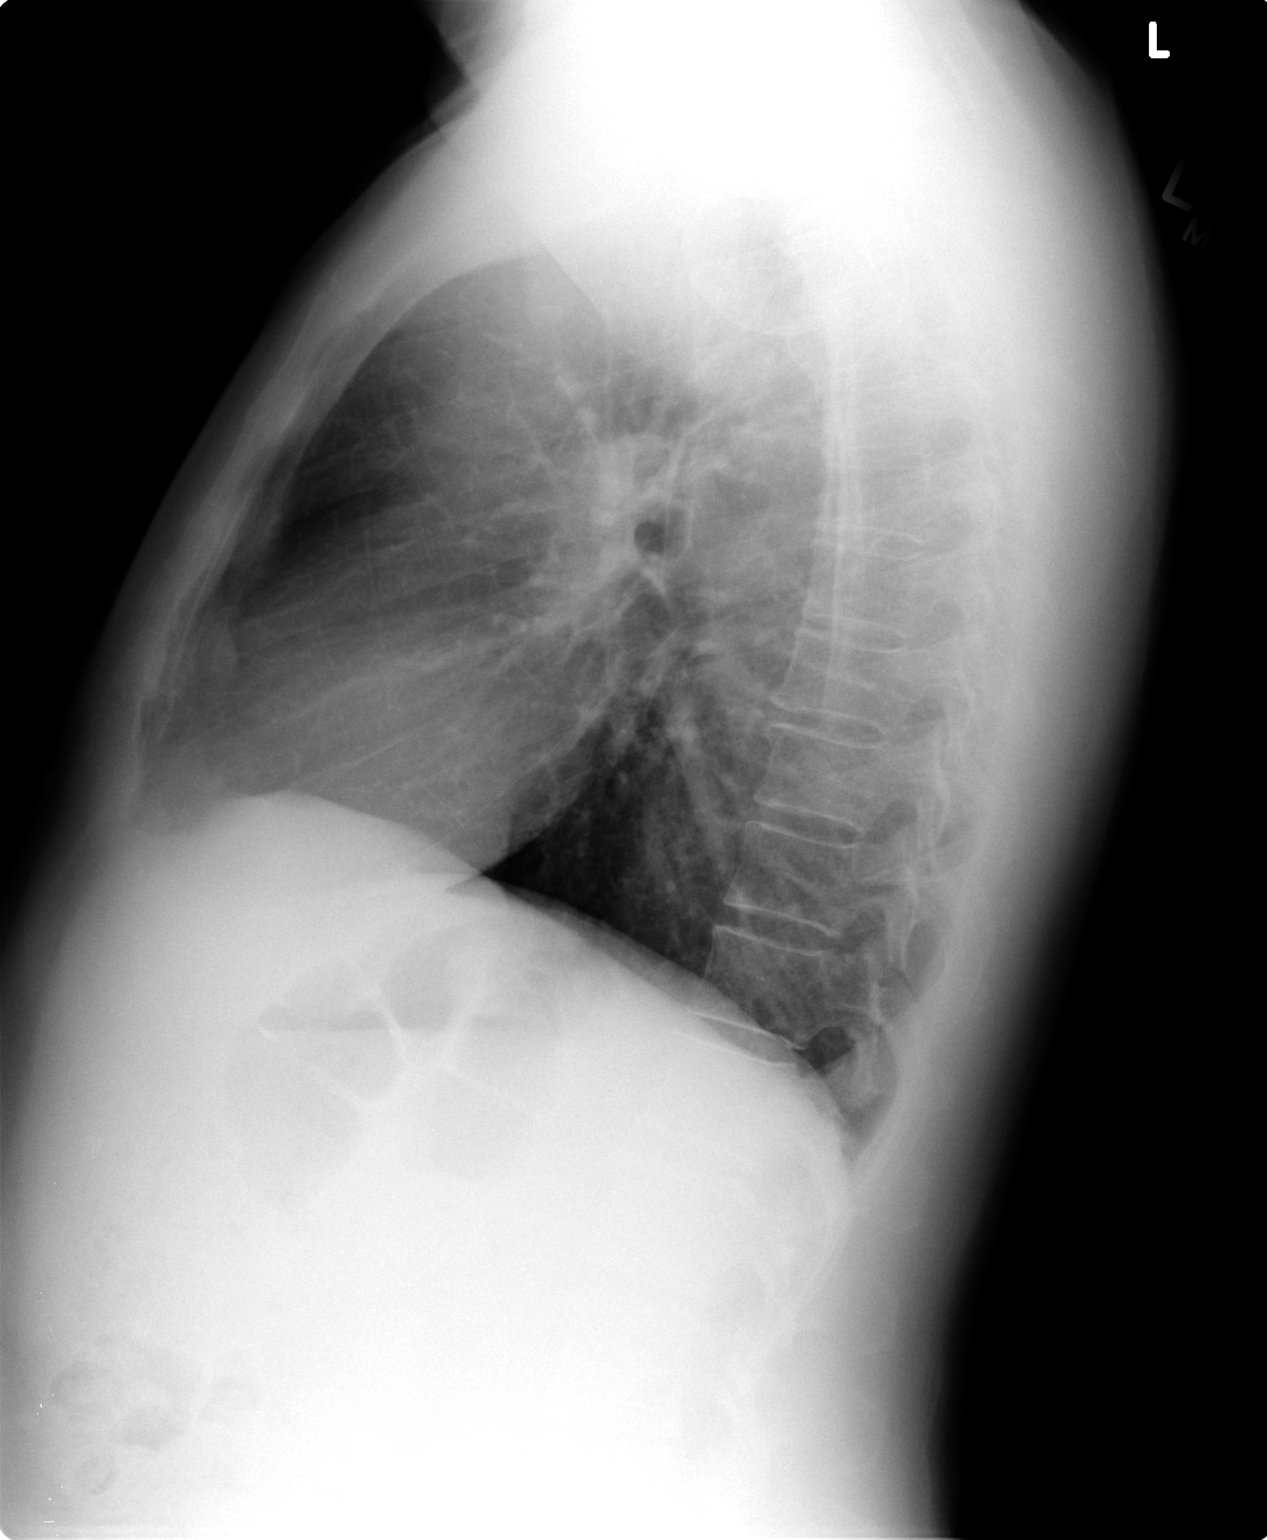

[2 of 2 positions shown; findings below may reference images not displayed]

FINDINGS: The heart size and mediastinal contours are within normal limits.
The left upper lobe nodule is unchanged from previous exam. No
airspace consolidation. The visualized skeletal structures are
unremarkable.
IMPRESSION: 1. No acute cardiopulmonary abnormalities.
2. Stable left upper lobe lung nodule.

## 2015-02-12 ENCOUNTER — Other Ambulatory Visit: Payer: Self-pay | Admitting: Internal Medicine

## 2015-02-14 NOTE — Telephone Encounter (Signed)
RX faxed to [facility] on file.

## 2015-02-21 ENCOUNTER — Telehealth: Payer: Self-pay | Admitting: Internal Medicine

## 2015-02-21 NOTE — Telephone Encounter (Signed)
Okay, please schedule office visit to get established within  a month

## 2015-02-21 NOTE — Telephone Encounter (Signed)
Patient scheduled for 02/23/2015 at 10am

## 2015-02-21 NOTE — Telephone Encounter (Signed)
Please advise 

## 2015-02-21 NOTE — Telephone Encounter (Signed)
Patient was referred by Dr. Linna Darner and stated he has seen Dr. Larose Kells before and would like to set up a new patient appointment. Please advise

## 2015-02-23 ENCOUNTER — Ambulatory Visit (INDEPENDENT_AMBULATORY_CARE_PROVIDER_SITE_OTHER): Payer: BLUE CROSS/BLUE SHIELD | Admitting: Internal Medicine

## 2015-02-23 ENCOUNTER — Encounter: Payer: Self-pay | Admitting: Internal Medicine

## 2015-02-23 VITALS — BP 112/58 | HR 70 | Temp 97.8°F | Ht 66.0 in | Wt 145.2 lb

## 2015-02-23 DIAGNOSIS — E785 Hyperlipidemia, unspecified: Secondary | ICD-10-CM

## 2015-02-23 DIAGNOSIS — G4700 Insomnia, unspecified: Secondary | ICD-10-CM

## 2015-02-23 DIAGNOSIS — R7303 Prediabetes: Secondary | ICD-10-CM

## 2015-02-23 DIAGNOSIS — K219 Gastro-esophageal reflux disease without esophagitis: Secondary | ICD-10-CM

## 2015-02-23 LAB — ALT: ALT: 31 U/L (ref 0–53)

## 2015-02-23 LAB — LIPID PANEL
Cholesterol: 347 mg/dL — ABNORMAL HIGH (ref 0–200)
HDL: 54.2 mg/dL (ref >39.00)
NonHDL: 292.36
Total CHOL/HDL Ratio: 6
Triglycerides: 349 mg/dL — ABNORMAL HIGH (ref 0.0–149.0)
VLDL: 69.8 mg/dL — ABNORMAL HIGH (ref 0.0–40.0)

## 2015-02-23 LAB — LDL CHOLESTEROL, DIRECT: Direct LDL: 222 mg/dL

## 2015-02-23 LAB — AST: AST: 19 U/L (ref 0–37)

## 2015-02-23 NOTE — Progress Notes (Addendum)
Subjective:    Patient ID: Kevin Farley, male    DOB: 1955-08-09, 60 y.o.   MRN: [id number]  DOS:  02/23/2015 Type of visit - description : New patient to me, transferring from Dr. Linna Darner Interval history: Hyperlipidemia: Self DC'd Lipitor 3 months ago due to aches and pains, symptoms decreased after that. Concerned about high cholesterol, ? blood test. Insomnia: lost 2 family members in the last year, sleeping 3 hours a night. Has lorazepam but uses it only once a  week on average GERD: Essentially asymptomatic, discontinued PPIs? Recently saw orthopedic surgery due to wrist  pain. Not fully better .  Review of Systems  Denies chest pain or difficulty breathing No heartburn, nausea, vomiting. No blood in the stools. Mild depression due to recent lost family members.  Past Medical History  Diagnosis Date  . Hyperlipidemia   . Allergic rhinitis   . Supraspinatus tendon tear   . Colon polyps     adenomatous 2011 & 2014  . GERD (gastroesophageal reflux disease)   . Family history of ischemic heart disease   . Other abnormal glucose     A1c 6% in 06/2009    Past Surgical History  Procedure Laterality Date  . Corneal transplant Left 1993    Dr Kathrin Penner  . Back surgery  2006    Dr Carloyn Manner, [place]  . Nasal bone surgery  2007  . Cardic catherization  2005    negative; Dr Lyla Son  . Colon polyps  2011 & 2014    Dr.Jacobs   . Upper gastrointestinal endoscopy  2011    mild gastritis; Dr Ardis Hughs    Social History   Social History  . Marital Status: Married    Spouse Name: N/A  . Number of Children: 1  . Years of Education: N/A   Occupational History  . manager of hotels    . OWNER    Social History Main Topics  . Smoking status: Never Smoker   . Smokeless tobacco: Never Used  . Alcohol Use: Yes     Comment: occasional  . Drug Use: No  . Sexual Activity: Not on file   Other Topics Concern  . Not on file   Social History Narrative   Never smoked   Regular diet   Does exercise 2x week   Coffee 1 daily        Medication List       This list is accurate as of: 02/23/15 10:16 AM.  Always use your most recent med list.               atorvastatin 40 MG tablet  Commonly known as:  LIPITOR  Take 40 mg by mouth daily. Reported on 02/23/2015     Co Q-10 100 MG Caps  Take 1 capsule by mouth daily.     cyclobenzaprine 5 MG tablet  Commonly known as:  FLEXERIL  1-2 qhs prn     DEXILANT 60 MG capsule  Generic drug:  dexlansoprazole  TAKE ONE CAPSULE BY MOUTH ONCE DAILY     Fish Oil 500 MG Caps  Take 1 capsule by mouth daily.     FLAX SEED OIL PO  Take by mouth.     fluticasone 50 MCG/ACT nasal spray  Commonly known as:  FLONASE  PLACE 2 SPRAYS INTO THE NOSE DAILY. AS NEEDED     hydrocortisone-pramoxine 2.5-1 % rectal cream  Commonly known as:  ANALPRAM-HC  PLACE RECTALLY 2 (TWO) TIMES DAILY.  LORazepam 0.5 MG tablet  Commonly known as:  ATIVAN  Take 1 tablet by mouth every 8-12 hours as needed, Not to be taken on a regular basis. --- Please establish with new PCP for further refills     multivitamin tablet  Take 1 tablet by mouth daily.           Objective:   Physical Exam BP 112/58 mmHg  Pulse 70  Temp(Src) 97.8 F (36.6 C) (Oral)  Ht 5\' 6"  (1.676 m)  Wt 145 lb 4 oz (65.885 kg)  BMI 23.46 kg/m2  SpO2 97% General:   Well developed, well nourished . NAD.  HEENT:  Normocephalic . Face symmetric, atraumatic Lungs:  CTA B Normal respiratory effort, no intercostal retractions, no accessory muscle use. Heart: RRR,  no murmur.  no pretibial edema bilaterally  Abdomen:  Not distended, soft, non-tender. No rebound or rigidity. No mass,organomegaly Skin: Not pale. Not jaundice Neurologic:  alert & oriented X3.  Speech normal, gait appropriate for age and unassisted Psych--  Cognition and judgment appear intact.  Cooperative with normal attention span and concentration.  Behavior appropriate. No anxious or  depressed appearing.     Assessment & Plan:   Assessment Prediabetes Hyperlipidemia Insomnia, saw pulmonary 2013, OSA r/o  GI: --GERD --Gastric polyps per EGD 03-2009, EGD again 10-2009- gastritis, bx done (-) HP FH CAD Corneal transplant- L 1993 [facility]: Dr Alfonso Ramus for wrist pain Pulm nodule, stable per CT 2012, no further f/u  PLAN: Extensive chart review today as he is a new pt to me, see above Prediabetes: Last A1c is stable Hyperlipidemia: Self discontinued Lipitor a few months ago due to aches and pains. Chart review, he is allergic/intolerant to Zetia and simvastatin. Plan: Check labs, Pravachol?Marland Kitchen GERD: asx  on PPIs for a few years, decrease dose to qod. Insomnia: Counseling about the recent loss she had, okay to take meds every night if needed. Contract signed today. UDS, RF PRN RTC 3-4 months CPX

## 2015-02-23 NOTE — Progress Notes (Signed)
Pre visit review using our clinic review tool, if applicable. No additional management support is needed unless otherwise documented below in the visit note. 

## 2015-02-23 NOTE — Patient Instructions (Signed)
BEFORE YOU LEAVE THE OFFICE:  GO TO THE LAB  Get the blood work    GO TO THE FRONT DESK Schedule a complete physical exam to be done in 3-4 months  Please be fasting     AFTER YOU LEAVE THE OFFICE:  Decreased Dexilant to one tablet every other day  Okay to take lorazepam every night if needed

## 2015-02-27 MED ORDER — PRAVASTATIN SODIUM 40 MG PO TABS: 40.0000 mg | ORAL_TABLET | Freq: Every day | ORAL | Status: DC

## 2015-02-27 NOTE — Addendum Note (Signed)
Addended byDamita Dunnings D on: 02/27/2015 10:42 AM   Modules accepted: Orders

## 2015-03-03 ENCOUNTER — Telehealth: Payer: Self-pay

## 2015-03-03 NOTE — Telephone Encounter (Signed)
UDS: 02/23/2015  Negative for Lorazepam: PRN   Low risk per Dr. Larose Kells 03/03/2015

## 2015-03-23 ENCOUNTER — Encounter: Payer: Self-pay | Admitting: Internal Medicine

## 2015-05-29 ENCOUNTER — Other Ambulatory Visit: Payer: Self-pay

## 2015-05-29 MED ORDER — PRAVASTATIN SODIUM 40 MG PO TABS: 40.0000 mg | ORAL_TABLET | Freq: Every day | ORAL | Status: DC

## 2015-06-01 DIAGNOSIS — M1711 Unilateral primary osteoarthritis, right knee: Secondary | ICD-10-CM | POA: Diagnosis not present

## 2015-06-06 ENCOUNTER — Ambulatory Visit (INDEPENDENT_AMBULATORY_CARE_PROVIDER_SITE_OTHER): Payer: BLUE CROSS/BLUE SHIELD | Admitting: Internal Medicine

## 2015-06-06 ENCOUNTER — Encounter: Payer: Self-pay | Admitting: Internal Medicine

## 2015-06-06 VITALS — BP 122/72 | HR 89 | Temp 98.0°F | Resp 16 | Ht 66.0 in | Wt 146.1 lb

## 2015-06-06 DIAGNOSIS — Z0000 Encounter for general adult medical examination without abnormal findings: Secondary | ICD-10-CM | POA: Diagnosis not present

## 2015-06-06 DIAGNOSIS — Z114 Encounter for screening for human immunodeficiency virus [HIV]: Secondary | ICD-10-CM

## 2015-06-06 DIAGNOSIS — Z125 Encounter for screening for malignant neoplasm of prostate: Secondary | ICD-10-CM

## 2015-06-06 DIAGNOSIS — M2550 Pain in unspecified joint: Secondary | ICD-10-CM

## 2015-06-06 DIAGNOSIS — R7303 Prediabetes: Secondary | ICD-10-CM

## 2015-06-06 DIAGNOSIS — E785 Hyperlipidemia, unspecified: Secondary | ICD-10-CM

## 2015-06-06 DIAGNOSIS — Z09 Encounter for follow-up examination after completed treatment for conditions other than malignant neoplasm: Secondary | ICD-10-CM | POA: Insufficient documentation

## 2015-06-06 DIAGNOSIS — R5383 Other fatigue: Secondary | ICD-10-CM

## 2015-06-06 DIAGNOSIS — IMO0001 Reserved for inherently not codable concepts without codable children: Secondary | ICD-10-CM

## 2015-06-06 MED FILL — DICLOFENAC SOD EC 75 MG TAB: 75 | 30 days supply | Qty: 60 | Fill #0

## 2015-06-06 NOTE — Patient Instructions (Signed)
GO TO THE FRONT DESK  Schedule labs to be done this week, fasting  Schedule your next appointment for a  routine checkup in 6 months, no fasting

## 2015-06-06 NOTE — Assessment & Plan Note (Signed)
Prediabetes: Continue with a healthy lifestyle, check A1c Hyperlipidemia: Restarted Pravachol on 02-2015, still has some aches and pains but states he can tolerate. Will check a FLP, sedimentation rate, LFTs and CK. Also, he is concerned about memory problems with statins, will do MMSE on RTC Insomnia: Ativan as needed UDS low risk 02-2015 RTC 6 months

## 2015-06-06 NOTE — Progress Notes (Signed)
Subjective:    Patient ID: Kevin Farley, male    DOB: May 31, 1955, 60 y.o.   MRN: [id number]  DOS:  06/06/2015 Type of visit - description : CPX, here with his wife  Interval history: Good compliance of medication, he is very active and eats healthy   Review of Systems Constitutional: No fever. No chills. No unexplained wt changes. No unusual sweats  HEENT: Has some dental problems, no ear discharge, no facial swelling, no voice changes. No eye discharge, no eye  redness , no  intolerance to light   Respiratory: No wheezing , no  difficulty breathing. No cough , no mucus production  Cardiovascular: No CP, no leg swelling , no  Palpitations  GI: no nausea, no vomiting, no diarrhea , no  abdominal pain.  No blood in the stools. No dysphagia, no odynophagia    Endocrine: No polyphagia, no polyuria , no polydipsia  GU: No dysuria, gross hematuria, difficulty urinating. No urinary urgency, no frequency.  Musculoskeletal: No joint swellings, has aches and pains mostly at the knees, shoulders, wrists, on and off. This is a ongoing issue, sometimes feels better without statins, symptoms are mild.  Skin: No change in the color of the skin, palor , no  Rash  Allergic, immunologic: No environmental allergies , no  food allergies  Neurological: No dizziness no  syncope. No headaches. No diplopia, no slurred, no slurred speech, no motor deficits, no facial  Numbness  Hematological: No enlarged lymph nodes, no easy bruising , no unusual bleedings  Psychiatry: No suicidal ideas, no hallucinations, no beavior problems, no confusion.  No unusual/severe anxiety, no depression   Past Medical History  Diagnosis Date  . Hyperlipidemia   . Allergic rhinitis   . Supraspinatus tendon tear   . Colon polyps     adenomatous 2011 & 2014  . GERD (gastroesophageal reflux disease)   . Family history of ischemic heart disease   . Other abnormal glucose     A1c 6% in 06/2009    Past Surgical  History  Procedure Laterality Date  . Corneal transplant Left 1993    Dr Kathrin Penner  . Back surgery  2006    Dr Carloyn Manner, [place]  . Nasal bone surgery  2007  . Cardic catherization  2005    negative; Dr Lyla Son  . Colon polyps  2011 & 2014    Dr.Jacobs   . Upper gastrointestinal endoscopy  2011    mild gastritis; Dr Ardis Hughs    Social History   Social History  . Marital Status: Married    Spouse Name: N/A  . Number of Children: 1  . Years of Education: N/A   Occupational History  . manager of hotels    . OWNER    Social History Main Topics  . Smoking status: Never Smoker   . Smokeless tobacco: Never Used  . Alcohol Use: Yes     Comment: occasional  . Drug Use: No  . Sexual Activity: Not on file   Other Topics Concern  . Not on file   Social History Narrative   Never smoked, 1 child at home       Family History  Problem Relation Age of Onset  . Hypertension Mother   . Diabetes Mother   . Stroke Mother     in 6s  . Diabetes Father   . Hypertension Father   . Heart attack Father     had open heart surgery  . Diabetes Brother  X2  . Heart attack Brother      MI in 55s  . Transient ischemic attack Brother     in 57s  . Kidney failure Brother   . Hypertension Brother   . Diabetes Sister      X 2  . Hypertension Sister   . COPD Neg Hx   . Asthma Neg Hx   . Cancer Neg Hx   . Colon polyps Neg Hx   . Kidney failure Brother     renal transplant  . Diabetes Brother         Medication List       This list is accurate as of: 06/06/15  6:01 PM.  Always use your most recent med list.               Co Q-10 100 MG Caps  Take 1 capsule by mouth daily.     DEXILANT 60 MG capsule  Generic drug:  dexlansoprazole  TAKE ONE CAPSULE BY MOUTH ONCE DAILY     Fish Oil 500 MG Caps  Take 1 capsule by mouth daily.     FLAX SEED OIL PO  Take by mouth.     fluticasone 50 MCG/ACT nasal spray  Commonly known as:  FLONASE  PLACE 2 SPRAYS INTO THE NOSE DAILY. AS  NEEDED     LORazepam 0.5 MG tablet  Commonly known as:  ATIVAN  Take 1 tablet by mouth every 8-12 hours as needed, Not to be taken on a regular basis. --- Please establish with new PCP for further refills     multivitamin tablet  Take 1 tablet by mouth daily.     pravastatin 40 MG tablet  Commonly known as:  PRAVACHOL  Take 1 tablet (40 mg total) by mouth daily.           Objective:   Physical Exam BP 122/72 mmHg  Pulse 89  Temp(Src) 98 F (36.7 C) (Oral)  Resp 16  Ht 5\' 6"  (1.676 m)  Wt 146 lb 2 oz (66.282 kg)  BMI 23.60 kg/m2  SpO2 98%  General:   Well developed, well nourished . NAD.  Neck: No  thyromegaly , normal carotid pulse HEENT:  Normocephalic . Face symmetric, atraumatic Lungs:  CTA B Normal respiratory effort, no intercostal retractions, no accessory muscle use. Heart: RRR,  no murmur.  No pretibial edema bilaterally  Abdomen:  Not distended, soft, non-tender. No rebound or rigidity.   MSK: Hands and wrists without synovitis Rectal:  External abnormalities: none. Normal sphincter tone. No rectal masses or tenderness.  Stool brown  Prostate: Prostate gland firm and smooth, no enlargement, nodularity, tenderness, mass, asymmetry or induration.  Skin: Exposed areas without rash. Not pale. Not jaundice Neurologic:  alert & oriented X3.  Speech normal, gait appropriate for age and unassisted Strength symmetric and appropriate for age.  Psych: Cognition and judgment appear intact.  Cooperative with normal attention span and concentration.  Behavior appropriate. No anxious or depressed appearing.    Assessment & Plan:   Assessment Prediabetes Hyperlipidemia Insomnia, saw pulmonary 2013, OSA r/o  GI: --GERD --Gastric polyps per EGD 03-2009, EGD again 10-2009- gastritis, bx done (-) HP FH CAD Corneal transplant- L 1993 [facility]: Dr Alfonso Ramus   Pulm nodule, stable per CT 2012, no further f/u  PLAN: Prediabetes: Continue with a healthy lifestyle, check  A1c Hyperlipidemia: Restarted Pravachol on 02-2015, still has some aches and pains but states he can tolerate. Will check a FLP, sedimentation rate, LFTs  and CK. Also, he is concerned about memory problems with statins, will do MMSE on RTC Insomnia: Ativan as needed UDS low risk 02-2015 RTC 6 months

## 2015-06-06 NOTE — Assessment & Plan Note (Addendum)
Td 2012 + FH CAD, diabetes and renal failure due to diabetes Colonoscopy 2011: 6 polyps, colonoscopy 2014, + polyps, repeat 5 years DRE normal 05-2015, check a PSA Continue with healthy diet and exercise Labs: CMP, FLP, sedimentation rate, CK, PSA, A1c, TSH, HIV

## 2015-06-06 NOTE — Progress Notes (Signed)
Pre visit review using our clinic review tool, if applicable. No additional management support is needed unless otherwise documented below in the visit note/SLS  

## 2015-06-09 ENCOUNTER — Other Ambulatory Visit (INDEPENDENT_AMBULATORY_CARE_PROVIDER_SITE_OTHER): Payer: BLUE CROSS/BLUE SHIELD

## 2015-06-09 DIAGNOSIS — R5383 Other fatigue: Secondary | ICD-10-CM | POA: Diagnosis not present

## 2015-06-09 DIAGNOSIS — M2550 Pain in unspecified joint: Secondary | ICD-10-CM | POA: Diagnosis not present

## 2015-06-09 DIAGNOSIS — Z125 Encounter for screening for malignant neoplasm of prostate: Secondary | ICD-10-CM

## 2015-06-09 DIAGNOSIS — IMO0001 Reserved for inherently not codable concepts without codable children: Secondary | ICD-10-CM

## 2015-06-09 DIAGNOSIS — Z114 Encounter for screening for human immunodeficiency virus [HIV]: Secondary | ICD-10-CM

## 2015-06-09 DIAGNOSIS — R7303 Prediabetes: Secondary | ICD-10-CM

## 2015-06-09 DIAGNOSIS — Z0000 Encounter for general adult medical examination without abnormal findings: Secondary | ICD-10-CM | POA: Diagnosis not present

## 2015-06-09 DIAGNOSIS — M609 Myositis, unspecified: Secondary | ICD-10-CM | POA: Diagnosis not present

## 2015-06-09 DIAGNOSIS — R7989 Other specified abnormal findings of blood chemistry: Secondary | ICD-10-CM | POA: Diagnosis not present

## 2015-06-09 DIAGNOSIS — M791 Myalgia: Secondary | ICD-10-CM | POA: Diagnosis not present

## 2015-06-09 DIAGNOSIS — E785 Hyperlipidemia, unspecified: Secondary | ICD-10-CM

## 2015-06-09 LAB — LDL CHOLESTEROL, DIRECT: Direct LDL: 183 mg/dL

## 2015-06-09 LAB — LIPID PANEL
Cholesterol: 301 mg/dL — ABNORMAL HIGH (ref 0–200)
HDL: 60.6 mg/dL (ref >39.00)
NonHDL: 240.59
Total CHOL/HDL Ratio: 5
Triglycerides: 331 mg/dL — ABNORMAL HIGH (ref 0.0–149.0)
VLDL: 66.2 mg/dL — ABNORMAL HIGH (ref 0.0–40.0)

## 2015-06-09 LAB — TSH: TSH: 2.33 u[IU]/mL (ref 0.35–4.50)

## 2015-06-09 LAB — COMPREHENSIVE METABOLIC PANEL
ALT: 34 U/L (ref 0–53)
AST: 22 U/L (ref 0–37)
Albumin: 4.4 g/dL (ref 3.5–5.2)
Alkaline Phosphatase: 47 U/L (ref 39–117)
BUN: 19 mg/dL (ref 6–23)
CO2: 26 mEq/L (ref 19–32)
Calcium: 9.3 mg/dL (ref 8.4–10.5)
Chloride: 105 mEq/L (ref 96–112)
Creatinine, Ser: 1.08 mg/dL (ref 0.40–1.50)
GFR: 74.52 mL/min (ref >60.00)
Glucose, Bld: 119 mg/dL — ABNORMAL HIGH (ref 70–99)
Potassium: 4 mEq/L (ref 3.5–5.1)
Sodium: 140 mEq/L (ref 135–145)
Total Bilirubin: 0.5 mg/dL (ref 0.2–1.2)
Total Protein: 7.4 g/dL (ref 6.0–8.3)

## 2015-06-09 LAB — PSA: PSA: 1.02 ng/mL (ref 0.10–4.00)

## 2015-06-09 LAB — HEMOGLOBIN A1C: Hgb A1c MFr Bld: 6.2 % (ref 4.6–6.5)

## 2015-06-09 LAB — SEDIMENTATION RATE: Sed Rate: 8 mm/hr (ref 0–22)

## 2015-06-10 LAB — HIV ANTIBODY (ROUTINE TESTING W REFLEX): HIV 1&2 Ab, 4th Generation: NONREACTIVE

## 2015-06-13 ENCOUNTER — Telehealth: Payer: Self-pay | Admitting: Emergency Medicine

## 2015-06-13 LAB — CK TOTAL AND CKMB (NOT AT ARMC): Total CK: 130 U/L (ref 7–232)

## 2015-06-13 NOTE — Telephone Encounter (Signed)
Kevin Farley from [facility] called and reported that this patient had a CKMB sent on 06/09/15, the specimen could not be ran because it needed to be frozen. Since the conversion to [facility] many changes have taken place and we apoloize. Does this patient need to be called back in?...KMP

## 2015-06-13 NOTE — Telephone Encounter (Signed)
Is okay, we don't need to MB

## 2015-06-14 ENCOUNTER — Other Ambulatory Visit: Payer: Self-pay | Admitting: Internal Medicine

## 2015-06-14 MED ORDER — PRAVASTATIN SODIUM 40 MG PO TABS: 80.0000 mg | ORAL_TABLET | Freq: Every day | ORAL | Status: DC

## 2015-06-19 ENCOUNTER — Telehealth: Payer: Self-pay | Admitting: *Deleted

## 2015-06-19 NOTE — Telephone Encounter (Signed)
Initiated PA via Cover My Meds for Pravastatin 40mg , 2 tablets daily exceed limitations; awaiting response/SLS 05/08

## 2015-06-22 MED ORDER — PRAVASTATIN SODIUM 80 MG PO TABS: 80.0000 mg | ORAL_TABLET | Freq: Every day | ORAL | Status: DC

## 2015-06-22 NOTE — Telephone Encounter (Signed)
PA denied for pravastatin 40mg , twice daily, but they will cover one 80mg . Please advise. JG//CMA

## 2015-06-22 NOTE — Telephone Encounter (Signed)
Escribed to pharmacy. Called and informed pt. He verbalized understanding, no further questions/concerns. JG//CMA

## 2015-06-22 NOTE — Telephone Encounter (Signed)
Okay to change to Pravachol 80 mg one tablet daily

## 2015-07-06 DIAGNOSIS — Z113 Encounter for screening for infections with a predominantly sexual mode of transmission: Secondary | ICD-10-CM | POA: Diagnosis not present

## 2015-07-06 DIAGNOSIS — Z1159 Encounter for screening for other viral diseases: Secondary | ICD-10-CM | POA: Diagnosis not present

## 2015-07-06 DIAGNOSIS — Z01812 Encounter for preprocedural laboratory examination: Secondary | ICD-10-CM | POA: Diagnosis not present

## 2015-08-04 DIAGNOSIS — H5712 Ocular pain, left eye: Secondary | ICD-10-CM | POA: Diagnosis not present

## 2015-08-04 DIAGNOSIS — B0052 Herpesviral keratitis: Secondary | ICD-10-CM | POA: Diagnosis not present

## 2015-08-04 DIAGNOSIS — Z947 Corneal transplant status: Secondary | ICD-10-CM | POA: Diagnosis not present

## 2015-08-09 DIAGNOSIS — Z947 Corneal transplant status: Secondary | ICD-10-CM | POA: Diagnosis not present

## 2015-08-09 DIAGNOSIS — B0052 Herpesviral keratitis: Secondary | ICD-10-CM | POA: Diagnosis not present

## 2015-08-22 ENCOUNTER — Telehealth: Payer: Self-pay | Admitting: Family

## 2015-08-22 NOTE — Telephone Encounter (Signed)
Ok with me 

## 2015-08-22 NOTE — Telephone Encounter (Signed)
Pt request to transfer from Dr. Larose Kells to [facility]. Please advise.

## 2015-08-22 NOTE — Telephone Encounter (Signed)
Ok w/ me , thx 

## 2015-08-23 DIAGNOSIS — H04123 Dry eye syndrome of bilateral lacrimal glands: Secondary | ICD-10-CM | POA: Diagnosis not present

## 2015-08-23 DIAGNOSIS — B0052 Herpesviral keratitis: Secondary | ICD-10-CM | POA: Diagnosis not present

## 2015-08-23 DIAGNOSIS — Z947 Corneal transplant status: Secondary | ICD-10-CM | POA: Diagnosis not present

## 2015-08-24 NOTE — Telephone Encounter (Signed)
Left vm for Kevin Farley to call back and schedule an appt with Kevin Farley for [name].

## 2015-08-26 ENCOUNTER — Other Ambulatory Visit: Payer: Self-pay | Admitting: Internal Medicine

## 2015-09-20 ENCOUNTER — Encounter: Payer: Self-pay | Admitting: Family

## 2015-09-20 ENCOUNTER — Ambulatory Visit (INDEPENDENT_AMBULATORY_CARE_PROVIDER_SITE_OTHER): Payer: BLUE CROSS/BLUE SHIELD | Admitting: Family

## 2015-09-20 ENCOUNTER — Other Ambulatory Visit (INDEPENDENT_AMBULATORY_CARE_PROVIDER_SITE_OTHER): Payer: BLUE CROSS/BLUE SHIELD

## 2015-09-20 DIAGNOSIS — Z0001 Encounter for general adult medical examination with abnormal findings: Secondary | ICD-10-CM | POA: Insufficient documentation

## 2015-09-20 DIAGNOSIS — Z0000 Encounter for general adult medical examination without abnormal findings: Secondary | ICD-10-CM

## 2015-09-20 LAB — COMPREHENSIVE METABOLIC PANEL
ALT: 38 U/L (ref 0–53)
AST: 20 U/L (ref 0–37)
Albumin: 4.5 g/dL (ref 3.5–5.2)
Alkaline Phosphatase: 58 U/L (ref 39–117)
BUN: 17 mg/dL (ref 6–23)
CO2: 31 mEq/L (ref 19–32)
Calcium: 9.4 mg/dL (ref 8.4–10.5)
Chloride: 105 mEq/L (ref 96–112)
Creatinine, Ser: 1.08 mg/dL (ref 0.40–1.50)
GFR: 74.44 mL/min (ref >60.00)
Glucose, Bld: 110 mg/dL — ABNORMAL HIGH (ref 70–99)
Potassium: 4.3 mEq/L (ref 3.5–5.1)
Sodium: 141 mEq/L (ref 135–145)
Total Bilirubin: 0.6 mg/dL (ref 0.2–1.2)
Total Protein: 7.3 g/dL (ref 6.0–8.3)

## 2015-09-20 LAB — PSA: PSA: 0.95 ng/mL (ref 0.10–4.00)

## 2015-09-20 LAB — LIPID PANEL
Cholesterol: 277 mg/dL — ABNORMAL HIGH (ref 0–200)
HDL: 59.5 mg/dL (ref >39.00)
LDL Cholesterol: 181 mg/dL — ABNORMAL HIGH (ref 0–99)
NonHDL: 217.02
Total CHOL/HDL Ratio: 5
Triglycerides: 182 mg/dL — ABNORMAL HIGH (ref 0.0–149.0)
VLDL: 36.4 mg/dL (ref 0.0–40.0)

## 2015-09-20 LAB — CBC
HCT: 41 % (ref 39.0–52.0)
Hemoglobin: 13.9 g/dL (ref 13.0–17.0)
MCHC: 33.9 g/dL (ref 30.0–36.0)
MCV: 82.9 fl (ref 78.0–100.0)
Platelets: 200 10*3/uL (ref 150.0–400.0)
RBC: 4.95 Mil/uL (ref 4.22–5.81)
RDW: 14.6 % (ref 11.5–15.5)
WBC: 4.9 10*3/uL (ref 4.0–10.5)

## 2015-09-20 LAB — HEMOGLOBIN A1C: Hgb A1c MFr Bld: 5.9 % (ref 4.6–6.5)

## 2015-09-20 LAB — VITAMIN D 25 HYDROXY (VIT D DEFICIENCY, FRACTURES): VITD: 40.39 ng/mL (ref 30.00–100.00)

## 2015-09-20 NOTE — Progress Notes (Signed)
Subjective:    Patient ID: Kevin Farley, male    DOB: [birth year], 60 y.o.   MRN: [id number]  Chief Complaint  Patient presents with  . Establish Care    CPE, fasting    HPI:  Kevin Farley is a 60 y.o. male who presents today for an annual wellness visit.   1) Health Maintenance -   Diet - Averages about 3 meals per day consisting of a regular diet; Minimal fast/processed. Caffeine on occasion.   Exercise - Regular through work and hobbies   2) Publishing rights manager / Immunizations:  Dental --Up to date  Vision -- Up to date   Health Maintenance  Topic Date Due  . INFLUENZA VACCINE  09/12/2015  . COLONOSCOPY  01/27/2018  . TETANUS/TDAP  10/18/2020  . Hepatitis C Screening  Completed  . HIV Screening  Completed    Immunization History  Administered Date(s) Administered  . Influenza Split 11/08/2011  . Influenza,inj,Quad PF,36+ Mos 10/21/2012, 10/27/2013, 01/20/2015  . Tdap 10/19/2010    Allergies  Allergen Reactions  . Aspirin     REACTION: hives States tolerates IBU & acetaminophen OK  . Other     Walnuts & pecans cause hives  . Codeine     Itching with codeine containing cough syrup 9/15  . Zetia [Ezetimibe]     See 09/22/14 musculoskeletal pains  . Ezetimibe-Simvastatin Hives    REACTION: myalgia     Outpatient Medications Prior to Visit  Medication Sig Dispense Refill  . Coenzyme Q10 (CO Q-10) 100 MG CAPS Take 1 capsule by mouth daily.    Marland Kitchen DEXILANT 60 MG capsule TAKE ONE CAPSULE BY MOUTH ONCE DAILY (Patient taking differently: TAKE ONE CAPSULE BY MOUTH ONCE EVERY OTHER DAY) 90 capsule 2  . Flaxseed, Linseed, (FLAX SEED OIL PO) Take by mouth.    . fluticasone (FLONASE) 50 MCG/ACT nasal spray PLACE 2 SPRAYS INTO THE NOSE DAILY. AS NEEDED 16 g 5  . LORazepam (ATIVAN) 0.5 MG tablet Take 1 tablet by mouth every 8-12 hours as needed, Not to be taken on a regular basis. --- Please establish with new PCP for further refills 30 tablet 0  . Multiple Vitamin  (MULTIVITAMIN) tablet Take 1 tablet by mouth daily.    . Omega-3 Fatty Acids (FISH OIL) 500 MG CAPS Take 1 capsule by mouth daily.    . pravastatin (PRAVACHOL) 40 MG tablet Take 1 tablet (40 mg total) by mouth daily. 90 tablet 1   No facility-administered medications prior to visit.      Past Medical History:  Diagnosis Date  . Allergic rhinitis   . Colon polyps    adenomatous 2011 & 2014  . Family history of ischemic heart disease   . GERD (gastroesophageal reflux disease)   . Hyperlipidemia   . Other abnormal glucose    A1c 6% in 06/2009  . Supraspinatus tendon tear      Past Surgical History:  Procedure Laterality Date  . BACK SURGERY  2006   Dr Carloyn Manner, [place]  . cardic catherization  2005   negative; Dr Lyla Son  . colon polyps  2011 & 2014   Dr.Jacobs   . CORNEAL TRANSPLANT Left 1993   Dr Kathrin Penner  . nasal bone surgery  2007  . UPPER GASTROINTESTINAL ENDOSCOPY  2011   mild gastritis; Dr Ardis Hughs     Family History  Problem Relation Age of Onset  . Hypertension Mother   . Diabetes Mother   . Stroke Mother  in 46s  . Diabetes Father   . Hypertension Father   . Heart attack Father     had open heart surgery  . Diabetes Brother     X2  . Heart attack Brother      MI in 49s  . Transient ischemic attack Brother     in 68s  . Kidney failure Brother   . Hypertension Brother   . Diabetes Sister      X 2  . Hypertension Sister   . Kidney failure Brother     renal transplant  . Diabetes Brother   . COPD Neg Hx   . Asthma Neg Hx   . Cancer Neg Hx   . Colon polyps Neg Hx      Social History   Social History  . Marital status: Married    Spouse name: N/A  . Number of children: 1  . Years of education: 11   Occupational History  . manager of hotels    . OWNER [place] History Main Topics  . Smoking status: Never Smoker  . Smokeless tobacco: Never Used  . Alcohol use 0.6 oz/week    1 Glasses of wine per week  . Drug use: No  . Sexual  activity: Not on file   Other Topics Concern  . Not on file   Social History Narrative   Never smoked, 1 child at home        Review of Systems  Constitutional: Denies fever, chills, fatigue, or significant weight gain/loss. HENT: Head: Denies headache or neck pain Ears: Denies changes in hearing, ringing in ears, earache, drainage Nose: Denies discharge, stuffiness, itching, nosebleed, sinus pain Throat: Denies sore throat, hoarseness, dry mouth, sores, thrush Eyes: Denies loss/changes in vision, pain, redness, blurry/double vision, flashing lights Cardiovascular: Denies chest pain/discomfort, tightness, palpitations, shortness of breath with activity, difficulty lying down, swelling, sudden awakening with shortness of breath Respiratory: Denies shortness of breath, cough, sputum production, wheezing Gastrointestinal: Denies dysphasia, heartburn, change in appetite, nausea, change in bowel habits, rectal bleeding, constipation, diarrhea, yellow skin or eyes Genitourinary: Denies frequency, urgency, burning/pain, blood in urine, incontinence, change in urinary strength. Musculoskeletal: Denies muscle/joint pain, stiffness, back pain, redness or swelling of joints, trauma Skin: Denies rashes, lumps, itching, dryness, color changes, or hair/nail changes Neurological: Denies dizziness, fainting, seizures, weakness, numbness, tingling, tremor Psychiatric - Denies nervousness, stress, depression or memory loss Endocrine: Denies heat or cold intolerance, sweating, frequent urination, excessive thirst, changes in appetite Hematologic: Denies ease of bruising or bleeding     Objective:     BP 128/84 (BP Location: Left Arm, Patient Position: Sitting, Cuff Size: Normal)   Pulse 66   Temp 98 F (36.7 C) (Oral)   Resp 16   Ht 5\' 6"  (1.676 m)   Wt 142 lb (64.4 kg)   SpO2 97%   BMI 22.92 kg/m  Nursing note and vital signs reviewed.  Physical Exam  Constitutional: He is oriented to  person, place, and time. He appears well-developed and well-nourished.  HENT:  Head: Normocephalic.  Right Ear: Hearing, tympanic membrane, external ear and ear canal normal.  Left Ear: Hearing, tympanic membrane, external ear and ear canal normal.  Nose: Nose normal.  Mouth/Throat: Uvula is midline, oropharynx is clear and moist and mucous membranes are normal.  Eyes: Conjunctivae and EOM are normal. Pupils are equal, round, and reactive to light.  Neck: Neck supple. No JVD present. No tracheal deviation present. No thyromegaly present.  Cardiovascular: Normal rate, regular rhythm, normal heart sounds and intact distal pulses.   Pulmonary/Chest: Effort normal and breath sounds normal.  Abdominal: Soft. Bowel sounds are normal. He exhibits no distension and no mass. There is no tenderness. There is no rebound and no guarding.  Musculoskeletal: Normal range of motion. He exhibits no edema or tenderness.  Lymphadenopathy:    He has no cervical adenopathy.  Neurological: He is alert and oriented to person, place, and time. He has normal reflexes. No cranial nerve deficit. He exhibits normal muscle tone. Coordination normal.  Skin: Skin is warm and dry.  Psychiatric: He has a normal mood and affect. His behavior is normal. Judgment and thought content normal.       Assessment & Plan:   Problem List Items Addressed This Visit      Other   Routine general medical examination at a health care facility    1) Anticipatory Guidance: Discussed importance of wearing a seatbelt while driving and not texting while driving; changing batteries in smoke detector at least once annually; wearing suntan lotion when outside; eating a balanced and moderate diet; getting physical activity at least 30 minutes per day.  2) Immunizations / Screenings / Labs:  All immunizations are up-to-date per recommendations. Obtain PSA for prostate cancer screening. Obtain A1c for diabetes screening. Obtain vitamin D for  vitamin D creening with previous vitamin D deficiency. All other screenings are up-to-date per recommendations. Obtain CBC, CMET, and lipid profile.  Overall well exam with risk factors for cardiovascular disease including prediabetes and hyperlipidemia. Currently maintained on medication for chronic conditions and will be updated with most recent blood work ordered today. He eats a balanced diet and is physically active through work. Continue healthy lifestyle behaviors and choices. Follow-up prevention exam in 1 year. Follow-up office visit for chronic conditions pending blood work.         Relevant Orders   CBC   Comprehensive metabolic panel   Lipid panel   PSA   Hemoglobin A1c   VITAMIN D 25 Hydroxy (Vit-D Deficiency, Fractures)    Other Visit Diagnoses   None.      I am having Mr. Suheyb maintain his (Flaxseed, Linseed, (FLAX SEED OIL PO)), Co Q-10, Fish Oil, multivitamin, fluticasone, DEXILANT, LORazepam, and pravastatin.   Follow-up: Return in about 6 months (around 03/22/2016), or if symptoms worsen or fail to improve.   Mauricio Po, FNP

## 2015-09-20 NOTE — Patient Instructions (Addendum)
Thank you for choosing [facility].  Summary/Instructions:  Please stop by the lab on the lower level of the building for your blood work. Your results will be released to [place] (or called to you) after review, usually within 72 hours after test completion. If any changes need to be made, you will be notified at that same time.  1. The lab is open from 7:30am to 5:30 pm Monday-Friday  2. No appointment is necessary  3. Fasting (if needed) is 6-8 hours after food and drink; black coffee  and water are okay   If your symptoms worsen or fail to improve, please contact our office for further instruction, or in case of emergency go directly to the emergency room at the closest medical facility.    Health Maintenance, Male A healthy lifestyle and preventative care can promote health and wellness.  Maintain regular health, dental, and eye exams.  Eat a healthy diet. Foods like vegetables, fruits, whole grains, low-fat dairy products, and lean protein foods contain the nutrients you need and are low in calories. Decrease your intake of foods high in solid fats, added sugars, and salt. Get information about a proper diet from your health care provider, if necessary.  Regular physical exercise is one of the most important things you can do for your health. Most adults should get at least 150 minutes of moderate-intensity exercise (any activity that increases your heart rate and causes you to sweat) each week. In addition, most adults need muscle-strengthening exercises on 2 or more days a week.   Maintain a healthy weight. The body mass index (BMI) is a screening tool to identify possible weight problems. It provides an estimate of body fat based on height and weight. Your health care provider can find your BMI and can help you achieve or maintain a healthy weight. For males 20 years and older:  A BMI below 18.5 is considered underweight.  A BMI of 18.5 to 24.9 is normal.  A BMI of 25 to  29.9 is considered overweight.  A BMI of 30 and above is considered obese.  Maintain normal blood lipids and cholesterol by exercising and minimizing your intake of saturated fat. Eat a balanced diet with plenty of fruits and vegetables. Blood tests for lipids and cholesterol should begin at age 75 and be repeated every 5 years. If your lipid or cholesterol levels are high, you are over age 20, or you are at high risk for heart disease, you may need your cholesterol levels checked more frequently.Ongoing high lipid and cholesterol levels should be treated with medicines if diet and exercise are not working.  If you smoke, find out from your health care provider how to quit. If you do not use tobacco, do not start.  Lung cancer screening is recommended for adults aged 57-80 years who are at high risk for developing lung cancer because of a history of smoking. A yearly low-dose CT scan of the lungs is recommended for people who have at least a 30-pack-year history of smoking and are current smokers or have quit within the past 15 years. A pack year of smoking is smoking an average of 1 pack of cigarettes a day for 1 year (for example, a 30-pack-year history of smoking could mean smoking 1 pack a day for 30 years or 2 packs a day for 15 years). Yearly screening should continue until the smoker has stopped smoking for at least 15 years. Yearly screening should be stopped for people who develop  a health problem that would prevent them from having lung cancer treatment.  If you choose to drink alcohol, do not have more than 2 drinks per day. One drink is considered to be 12 oz (360 mL) of beer, 5 oz (150 mL) of wine, or 1.5 oz (45 mL) of liquor.  Avoid the use of street drugs. Do not share needles with anyone. Ask for help if you need support or instructions about stopping the use of drugs.  High blood pressure causes heart disease and increases the risk of stroke. High blood pressure is more likely to  develop in:  People who have blood pressure in the end of the normal range (100-139/85-89 mm Hg).  People who are overweight or obese.  People who are African American.  If you are 39-65 years of age, have your blood pressure checked every 3-5 years. If you are 65 years of age or older, have your blood pressure checked every year. You should have your blood pressure measured twice--once when you are at a hospital or clinic, and once when you are not at a hospital or clinic. Record the average of the two measurements. To check your blood pressure when you are not at a hospital or clinic, you can use:  An automated blood pressure machine at a pharmacy.  A home blood pressure monitor.  If you are 89-38 years old, ask your health care provider if you should take aspirin to prevent heart disease.  Diabetes screening involves taking a blood sample to check your fasting blood sugar level. This should be done once every 3 years after age 17 if you are at a normal weight and without risk factors for diabetes. Testing should be considered at a younger age or be carried out more frequently if you are overweight and have at least 1 risk factor for diabetes.  Colorectal cancer can be detected and often prevented. Most routine colorectal cancer screening begins at the age of 30 and continues through age 48. However, your health care provider may recommend screening at an earlier age if you have risk factors for colon cancer. On a yearly basis, your health care provider may provide home test kits to check for hidden blood in the stool. A small camera at the end of a tube may be used to directly examine the colon (sigmoidoscopy or colonoscopy) to detect the earliest forms of colorectal cancer. Talk to your health care provider about this at age 40 when routine screening begins. A direct exam of the colon should be repeated every 5-10 years through age 62, unless early forms of precancerous polyps or small growths  are found.  People who are at an increased risk for hepatitis B should be screened for this virus. You are considered at high risk for hepatitis B if:  You were born in a country where hepatitis B occurs often. Talk with your health care provider about which countries are considered high risk.  Your parents were born in a high-risk country and you have not received a shot to protect against hepatitis B (hepatitis B vaccine).  You have HIV or AIDS.  You use needles to inject street drugs.  You live with, or have sex with, someone who has hepatitis B.  You are a man who has sex with other men (MSM).  You get hemodialysis treatment.  You take certain medicines for conditions like cancer, organ transplantation, and autoimmune conditions.  Hepatitis C blood testing is recommended for all people born from  1945 through 1965 and any individual with known risk factors for hepatitis C.  Healthy men should no longer receive prostate-specific antigen (PSA) blood tests as part of routine cancer screening. Talk to your health care provider about prostate cancer screening.  Testicular cancer screening is not recommended for adolescents or adult males who have no symptoms. Screening includes self-exam, a health care provider exam, and other screening tests. Consult with your health care provider about any symptoms you have or any concerns you have about testicular cancer.  Practice safe sex. Use condoms and avoid high-risk sexual practices to reduce the spread of sexually transmitted infections (STIs).  You should be screened for STIs, including gonorrhea and chlamydia if:  You are sexually active and are younger than 24 years.  You are older than 24 years, and your health care provider tells you that you are at risk for this type of infection.  Your sexual activity has changed since you were last screened, and you are at an increased risk for chlamydia or gonorrhea. Ask your health care provider  if you are at risk.  If you are at risk of being infected with HIV, it is recommended that you take a prescription medicine daily to prevent HIV infection. This is called pre-exposure prophylaxis (PrEP). You are considered at risk if:  You are a man who has sex with other men (MSM).  You are a heterosexual man who is sexually active with multiple partners.  You take drugs by injection.  You are sexually active with a partner who has HIV.  Talk with your health care provider about whether you are at high risk of being infected with HIV. If you choose to begin PrEP, you should first be tested for HIV. You should then be tested every 3 months for as long as you are taking PrEP.  Use sunscreen. Apply sunscreen liberally and repeatedly throughout the day. You should seek shade when your shadow is shorter than you. Protect yourself by wearing long sleeves, pants, a wide-brimmed hat, and sunglasses year round whenever you are outdoors.  Tell your health care provider of new moles or changes in moles, especially if there is a change in shape or color. Also, tell your health care provider if a mole is larger than the size of a pencil eraser.  A one-time screening for abdominal aortic aneurysm (AAA) and surgical repair of large AAAs by ultrasound is recommended for men aged 49-75 years who are current or former smokers.  Stay current with your vaccines (immunizations).   This information is not intended to replace advice given to you by your health care provider. Make sure you discuss any questions you have with your health care provider.   Document Released: 07/27/2007 Document Revised: 02/18/2014 Document Reviewed: 06/25/2010 Elsevier Interactive Patient Education [employer].

## 2015-09-20 NOTE — Assessment & Plan Note (Signed)
1) Anticipatory Guidance: Discussed importance of wearing a seatbelt while driving and not texting while driving; changing batteries in smoke detector at least once annually; wearing suntan lotion when outside; eating a balanced and moderate diet; getting physical activity at least 30 minutes per day.  2) Immunizations / Screenings / Labs:  All immunizations are up-to-date per recommendations. Obtain PSA for prostate cancer screening. Obtain A1c for diabetes screening. Obtain vitamin D for vitamin D creening with previous vitamin D deficiency. All other screenings are up-to-date per recommendations. Obtain CBC, CMET, and lipid profile.  Overall well exam with risk factors for cardiovascular disease including prediabetes and hyperlipidemia. Currently maintained on medication for chronic conditions and will be updated with most recent blood work ordered today. He eats a balanced diet and is physically active through work. Continue healthy lifestyle behaviors and choices. Follow-up prevention exam in 1 year. Follow-up office visit for chronic conditions pending blood work.

## 2015-11-27 ENCOUNTER — Other Ambulatory Visit: Payer: Self-pay | Admitting: Family

## 2015-11-28 NOTE — Telephone Encounter (Signed)
Last refill was in January 2017.

## 2015-11-29 NOTE — Telephone Encounter (Signed)
Rx faxed

## 2015-12-05 ENCOUNTER — Ambulatory Visit: Payer: BLUE CROSS/BLUE SHIELD | Admitting: Internal Medicine

## 2016-01-09 ENCOUNTER — Ambulatory Visit (INDEPENDENT_AMBULATORY_CARE_PROVIDER_SITE_OTHER): Payer: BLUE CROSS/BLUE SHIELD | Admitting: Internal Medicine

## 2016-01-09 ENCOUNTER — Encounter: Payer: Self-pay | Admitting: Internal Medicine

## 2016-01-09 VITALS — BP 118/70 | HR 62 | Temp 98.1°F | Resp 14 | Ht 66.0 in | Wt 147.2 lb

## 2016-01-09 DIAGNOSIS — L299 Pruritus, unspecified: Secondary | ICD-10-CM | POA: Diagnosis not present

## 2016-01-09 DIAGNOSIS — Z23 Encounter for immunization: Secondary | ICD-10-CM

## 2016-01-09 MED ORDER — HYDROCORTISONE 2.5 % EX CREA: TOPICAL_CREAM | Freq: Two times a day (BID) | CUTANEOUS | 0 refills | Status: DC

## 2016-01-09 NOTE — Progress Notes (Signed)
Pre visit review using our clinic review tool, if applicable. No additional management support is needed unless otherwise documented below in the visit note. 

## 2016-01-09 NOTE — Patient Instructions (Signed)
Please use the cream as prescribed twice a day for the next few days  If you're not improving let me know

## 2016-01-09 NOTE — Progress Notes (Signed)
Subjective:    Patient ID: Kevin Farley, male    DOB: August 21, 1955, 60 y.o.   MRN: [id number]  DOS:  01/09/2016 Type of visit - description : acute visit  Interval history: Few months history of plantar itching, worse on the left. Has not seen any rash. Has been using over-the-counter powder without much success.   Review of Systems  No other areas affected. No nocturnal symptoms such as burning.  Past Medical History:  Diagnosis Date  . Allergic rhinitis   . Colon polyps    adenomatous 2011 & 2014  . Family history of ischemic heart disease   . GERD (gastroesophageal reflux disease)   . Hyperlipidemia   . Other abnormal glucose    A1c 6% in 06/2009  . Supraspinatus tendon tear     Past Surgical History:  Procedure Laterality Date  . BACK SURGERY  2006   Dr Carloyn Manner, [place]  . cardic catherization  2005   negative; Dr Lyla Son  . colon polyps  2011 & 2014   Dr.Jacobs   . CORNEAL TRANSPLANT Left 1993   Dr Kathrin Penner  . nasal bone surgery  2007  . UPPER GASTROINTESTINAL ENDOSCOPY  2011   mild gastritis; Dr Ardis Hughs    Social History   Social History  . Marital status: Married    Spouse name: N/A  . Number of children: 1  . Years of education: 28   Occupational History  . manager of hotels    . OWNER [place] History Main Topics  . Smoking status: Never Smoker  . Smokeless tobacco: Never Used  . Alcohol use 0.6 oz/week    1 Glasses of wine per week  . Drug use: No  . Sexual activity: Not on file   Other Topics Concern  . Not on file   Social History Narrative   Never smoked, 1 child at home            Medication List       Accurate as of 01/09/16 11:59 PM. Always use your most recent med list.          Co Q-10 100 MG Caps Take 1 capsule by mouth daily.   DEXILANT 60 MG capsule Generic drug:  dexlansoprazole TAKE ONE CAPSULE BY MOUTH ONCE DAILY   Fish Oil 500 MG Caps Take 1 capsule by mouth daily.   FLAX SEED OIL PO Take by  mouth.   fluticasone 50 MCG/ACT nasal spray Commonly known as:  FLONASE PLACE 2 SPRAYS INTO THE NOSE DAILY. AS NEEDED   hydrocortisone 2.5 % cream Apply topically 2 (two) times daily.   LORazepam 0.5 MG tablet Commonly known as:  ATIVAN TAKE 1 TABLET BY MOUTH EVERY 8 TO 12 HOURS AS NEEDED ESTABLISH WITH NEW PCP FOR FURTHER REFILLS   multivitamin tablet Take 1 tablet by mouth daily.   pravastatin 40 MG tablet Commonly known as:  PRAVACHOL Take 1 tablet (40 mg total) by mouth daily.          Objective:   Physical Exam BP 118/70 (BP Location: Left Arm, Patient Position: Sitting, Cuff Size: Normal)   Pulse 62   Temp 98.1 F (36.7 C) (Oral)   Resp 14   Ht 5\' 6"  (1.676 m)   Wt 147 lb 4 oz (66.8 kg)   SpO2 98%   BMI 23.77 kg/m  General:   Well developed, well nourished . NAD.  HEENT:  Normocephalic . Face symmetric, atraumatic Feet-  Normal  to inspection on palpation. Skin is healthy throughout, no dry, no rash, no evidence of scratching Neurologic:  alert & oriented X3.  Speech normal, gait appropriate for age and unassisted Psych--  Cognition and judgment appear intact.  Cooperative with normal attention span and concentration.  Behavior appropriate. No anxious or depressed appearing.      Assessment & Plan:   Itching, plantar areas: Etiology unclear, patient is concerned about symptoms related to blood sugar but symptoms are not consistent with neuropathy. Will prescribe hydrocortisone for a few days, call if not better. Dermatology?Marland Kitchen  Also request general labs, recommend to see PCP. We did offer him a flu shot and he agreed.

## 2016-02-06 ENCOUNTER — Encounter: Payer: Self-pay | Admitting: Family

## 2016-02-06 ENCOUNTER — Ambulatory Visit (INDEPENDENT_AMBULATORY_CARE_PROVIDER_SITE_OTHER): Payer: BLUE CROSS/BLUE SHIELD | Admitting: Family

## 2016-02-06 ENCOUNTER — Other Ambulatory Visit (INDEPENDENT_AMBULATORY_CARE_PROVIDER_SITE_OTHER): Payer: BLUE CROSS/BLUE SHIELD

## 2016-02-06 VITALS — BP 140/90 | HR 73 | Temp 97.9°F | Resp 16 | Ht 66.0 in | Wt 148.0 lb

## 2016-02-06 DIAGNOSIS — E782 Mixed hyperlipidemia: Secondary | ICD-10-CM

## 2016-02-06 DIAGNOSIS — L299 Pruritus, unspecified: Secondary | ICD-10-CM | POA: Diagnosis not present

## 2016-02-06 LAB — COMPREHENSIVE METABOLIC PANEL
ALT: 41 U/L (ref 0–53)
AST: 20 U/L (ref 0–37)
Albumin: 4.5 g/dL (ref 3.5–5.2)
Alkaline Phosphatase: 53 U/L (ref 39–117)
BUN: 20 mg/dL (ref 6–23)
CO2: 29 mEq/L (ref 19–32)
Calcium: 9.3 mg/dL (ref 8.4–10.5)
Chloride: 106 mEq/L (ref 96–112)
Creatinine, Ser: 1.16 mg/dL (ref 0.40–1.50)
GFR: 68.46 mL/min (ref >60.00)
Glucose, Bld: 116 mg/dL — ABNORMAL HIGH (ref 70–99)
Potassium: 4.3 mEq/L (ref 3.5–5.1)
Sodium: 143 mEq/L (ref 135–145)
Total Bilirubin: 0.5 mg/dL (ref 0.2–1.2)
Total Protein: 7.5 g/dL (ref 6.0–8.3)

## 2016-02-06 LAB — LIPID PANEL
Cholesterol: 308 mg/dL — ABNORMAL HIGH (ref 0–200)
HDL: 61.2 mg/dL (ref >39.00)
NonHDL: 247.17
Total CHOL/HDL Ratio: 5
Triglycerides: 210 mg/dL — ABNORMAL HIGH (ref 0.0–149.0)
VLDL: 42 mg/dL — ABNORMAL HIGH (ref 0.0–40.0)

## 2016-02-06 LAB — LDL CHOLESTEROL, DIRECT: Direct LDL: 206 mg/dL

## 2016-02-06 LAB — HEMOGLOBIN A1C: Hgb A1c MFr Bld: 6.1 % (ref 4.6–6.5)

## 2016-02-06 NOTE — Patient Instructions (Signed)
Thank you for choosing [facility].  SUMMARY AND INSTRUCTIONS:  Happy New Year!  Try Aveeno, Dove or Eucerin products for moisturization.   May also try a Benedryl cream as needed.  Continue to take medications as prescribed.   Follow up if symptoms worsen.   You will receive a call about nutrition therapy.    Medication:  Your prescription(s) have been submitted to your pharmacy or been printed and provided for you. Please take as directed and contact our office if you believe you are having problem(s) with the medication(s) or have any questions.  Labs:  Please stop by the lab on the lower level of the building for your blood work. Your results will be released to [place] (or called to you) after review, usually within 72 hours after test completion. If any changes need to be made, you will be notified at that same time.  1.) The lab is open from 7:30am to 5:30 pm Monday-Friday 2.) No appointment is necessary 3.) Fasting (if needed) is 6-8 hours after food and drink; black coffee and water are okay     Follow up:  If your symptoms worsen or fail to improve, please contact our office for further instruction, or in case of emergency go directly to the emergency room at the closest medical facility.   Pruritus Introduction Pruritus is an itching feeling. There are many different conditions and factors that can make your skin itchy. Dry skin is one of the most common causes of itching. Most cases of itching do not require medical attention. Itchy skin can turn into a rash. Follow these instructions at home: Watch your pruritus for any changes. Take these steps to help with your condition: Skin Care  Moisturize your skin as needed. A moisturizer that contains petroleum jelly is best for keeping moisture in your skin.  Take or apply medicines only as directed by your health care provider. This may include:  Corticosteroid cream.  Anti-itch lotions.  Oral  anti-histamines.  Apply cool compresses to the affected areas.  Try taking a bath with:  Epsom salts. Follow the instructions on the packaging. You can get these at your local pharmacy or grocery store.  Baking soda. Pour a small amount into the bath as directed by your health care provider.  Colloidal oatmeal. Follow the instructions on the packaging. You can get this at your local pharmacy or grocery store.  Try applying baking soda paste to your skin. Stir water into baking soda until it reaches a paste-like consistency.  Do not scratch your skin.  Avoid hot showers or baths, which can make itching worse. A cold shower may help with itching as long as you use a moisturizer after.  Avoid scented soaps, detergents, and perfumes. Use gentle soaps, detergents, perfumes, and other cosmetic products. General instructions  Avoid wearing tight clothes.  Keep a journal to help track what causes your itch. Write down:  What you eat.  What cosmetic products you use.  What you drink.  What you wear. This includes jewelry.  Use a humidifier. This keeps the air moist, which helps to prevent dry skin. Contact a health care provider if:  The itching does not go away after several days.  You sweat at night.  You have weight loss.  You are unusually thirsty.  You urinate more than normal.  You are more tired than normal.  You have abdominal pain.  Your skin tingles.  You feel weak.  Your skin or the whites of  your eyes look yellow (jaundice).  Your skin feels numb. This information is not intended to replace advice given to you by your health care provider. Make sure you discuss any questions you have with your health care provider. Document Released: 10/10/2010 Document Revised: 07/06/2015 Document Reviewed: 01/24/2014  2017 Elsevier

## 2016-02-06 NOTE — Assessment & Plan Note (Signed)
Continues to have hyperlipidemia and maintained on pravastatin. Obtain lipid profile and complete metabolic panel for therapeutic drug monitoring. If cholesterol remains elevated consider possible referral to lipid clinic. Continue current dosage of pravastatin pending blood work results.

## 2016-02-06 NOTE — Progress Notes (Signed)
Subjective:    Patient ID: Kevin Farley, male    DOB: 01-14-1956, 60 y.o.   MRN: [id number]  Chief Complaint  Patient presents with  . Pruritis    having itching at the bottom of both of his feet, x1 month, recheck of cholesterol    HPI:  Kevin Farley is a 60 y.o. male who  has a past medical history of Allergic rhinitis; Colon polyps; Family history of ischemic heart disease; GERD (gastroesophageal reflux disease); Hyperlipidemia; Other abnormal glucose; and Supraspinatus tendon tear. and presents today for an office visit.  1.) Pruritis - Associated symptom of itching located on the bottom of both his feet have been going on for about 1 month. Timing of the symptoms is worse at night. Symptoms are generally worse on the left side compared to the right. No rashes. Reports taking the hydrocortisone cream as prescribed with little to no improvement. Does have some concern for possible elevated blood sugars.   2.) Hyperlipidemia - Currently maintained on pravastatin. Reports taking the medication as prescribed and denies adverse side effects or myalgias.  Lab Results  Component Value Date   CHOL 308 (H) 02/06/2016   HDL 61.20 02/06/2016   LDLCALC 181 (H) 09/20/2015   LDLDIRECT 206.0 02/06/2016   TRIG 210.0 (H) 02/06/2016   CHOLHDL 5 02/06/2016     Allergies  Allergen Reactions  . Aspirin     REACTION: hives States tolerates IBU & acetaminophen OK  . Other     Walnuts & pecans cause hives  . Codeine     Itching with codeine containing cough syrup 9/15  . Zetia [Ezetimibe]     See 09/22/14 musculoskeletal pains  . Ezetimibe-Simvastatin Hives    REACTION: myalgia      Outpatient Medications Prior to Visit  Medication Sig Dispense Refill  . Coenzyme Q10 (CO Q-10) 100 MG CAPS Take 1 capsule by mouth daily.    Marland Kitchen DEXILANT 60 MG capsule TAKE ONE CAPSULE BY MOUTH ONCE DAILY (Patient taking differently: TAKE ONE CAPSULE BY MOUTH ONCE EVERY OTHER DAY) 90 capsule 2  . Flaxseed,  Linseed, (FLAX SEED OIL PO) Take by mouth.    . fluticasone (FLONASE) 50 MCG/ACT nasal spray PLACE 2 SPRAYS INTO THE NOSE DAILY. AS NEEDED 16 g 5  . hydrocortisone 2.5 % cream Apply topically 2 (two) times daily. 30 g 0  . LORazepam (ATIVAN) 0.5 MG tablet TAKE 1 TABLET BY MOUTH EVERY 8 TO 12 HOURS AS NEEDED ESTABLISH WITH NEW PCP FOR FURTHER REFILLS 30 tablet 0  . Multiple Vitamin (MULTIVITAMIN) tablet Take 1 tablet by mouth daily.    . Omega-3 Fatty Acids (FISH OIL) 500 MG CAPS Take 1 capsule by mouth daily.    . pravastatin (PRAVACHOL) 40 MG tablet Take 1 tablet (40 mg total) by mouth daily. 90 tablet 1   No facility-administered medications prior to visit.       Past Surgical History:  Procedure Laterality Date  . BACK SURGERY  2006   Dr Carloyn Manner, [place]  . cardic catherization  2005   negative; Dr Lyla Son  . colon polyps  2011 & 2014   Dr.Jacobs   . CORNEAL TRANSPLANT Left 1993   Dr Kathrin Penner  . nasal bone surgery  2007  . UPPER GASTROINTESTINAL ENDOSCOPY  2011   mild gastritis; Dr Ardis Hughs      Past Medical History:  Diagnosis Date  . Allergic rhinitis   . Colon polyps    adenomatous 2011 & 2014  .  Family history of ischemic heart disease   . GERD (gastroesophageal reflux disease)   . Hyperlipidemia   . Other abnormal glucose    A1c 6% in 06/2009  . Supraspinatus tendon tear       Review of Systems  Constitutional: Negative for chills and fever.  Eyes:       Negative for changes in vision  Respiratory: Negative for cough, chest tightness and wheezing.   Cardiovascular: Negative for chest pain, palpitations and leg swelling.  Skin: Negative for rash.       Positive for pruritis of bilateral feet.  Neurological: Negative for dizziness, weakness and light-headedness.      Objective:    BP 140/90 (BP Location: Left Arm, Patient Position: Sitting, Cuff Size: Normal)   Pulse 73   Temp 97.9 F (36.6 C) (Oral)   Resp 16   Ht 5\' 6"  (1.676 m)   Wt 148 lb (67.1 kg)    SpO2 97%   BMI 23.89 kg/m  Nursing note and vital signs reviewed.  Physical Exam  Constitutional: He is oriented to person, place, and time. He appears well-developed and well-nourished. No distress.  Cardiovascular: Normal rate, regular rhythm, normal heart sounds and intact distal pulses.   Pulmonary/Chest: Effort normal and breath sounds normal.  Musculoskeletal:  Bilateral plantar aspect of feet - no obvious deformity, discoloration, edema, or rashes. No tenderness, crepitus, or deformity able to be noted. Range of motion and strength are normal.   Neurological: He is alert and oriented to person, place, and time.  Skin: Skin is warm and dry.  Psychiatric: He has a normal mood and affect. His behavior is normal. Judgment and thought content normal.       Assessment & Plan:   Problem List Items Addressed This Visit      Musculoskeletal and Integument   Pruritus    Pruritus with no significant origin or rash that is refractory to hydrocortisone cream. Recommend continued moisturization with Aveeno, Dove, or Eucerin products. Continue hydrocortisone as needed. Continue Benadryl as needed. Patient with concern for possible diabetes. Obtain A1c. Continue to monitor.      Relevant Orders   HgB A1c (Completed)     Other   Hyperlipidemia - Primary    Continues to have hyperlipidemia and maintained on pravastatin. Obtain lipid profile and complete metabolic panel for therapeutic drug monitoring. If cholesterol remains elevated consider possible referral to lipid clinic. Continue current dosage of pravastatin pending blood work results.      Relevant Orders   Lipid Profile (Completed)   Comprehensive metabolic panel (Completed)   Amb ref to Medical Nutrition Therapy-MNT       I am having [name] maintain his (Flaxseed, Linseed, (FLAX SEED OIL PO)), Co Q-10, Fish Oil, multivitamin, fluticasone, DEXILANT, pravastatin, LORazepam, and hydrocortisone.   Follow-up: Return if  symptoms worsen or fail to improve.  Mauricio Po, FNP

## 2016-02-06 NOTE — Assessment & Plan Note (Signed)
Pruritus with no significant origin or rash that is refractory to hydrocortisone cream. Recommend continued moisturization with Aveeno, Dove, or Eucerin products. Continue hydrocortisone as needed. Continue Benadryl as needed. Patient with concern for possible diabetes. Obtain A1c. Continue to monitor.

## 2016-03-01 ENCOUNTER — Other Ambulatory Visit: Payer: Self-pay

## 2016-03-01 MED ORDER — DEXLANSOPRAZOLE 60 MG PO CPDR: 1.0000 | DELAYED_RELEASE_CAPSULE | Freq: Every day | ORAL | 2 refills | Status: DC

## 2016-03-06 ENCOUNTER — Telehealth: Payer: Self-pay | Admitting: Family

## 2016-03-06 DIAGNOSIS — K219 Gastro-esophageal reflux disease without esophagitis: Secondary | ICD-10-CM | POA: Insufficient documentation

## 2016-03-06 NOTE — Telephone Encounter (Signed)
Following up on PA on dexilant

## 2016-03-07 NOTE — Telephone Encounter (Signed)
PA has been done.  Waiting on response.  

## 2016-03-07 NOTE — Telephone Encounter (Signed)
PA was approved. Called pharmacy to let them know.

## 2016-03-14 ENCOUNTER — Encounter: Payer: BLUE CROSS/BLUE SHIELD | Attending: Internal Medicine | Admitting: Dietician

## 2016-03-14 ENCOUNTER — Encounter: Payer: Self-pay | Admitting: Dietician

## 2016-03-14 DIAGNOSIS — E781 Pure hyperglyceridemia: Secondary | ICD-10-CM | POA: Insufficient documentation

## 2016-03-14 DIAGNOSIS — R7303 Prediabetes: Secondary | ICD-10-CM

## 2016-03-14 DIAGNOSIS — Z713 Dietary counseling and surveillance: Secondary | ICD-10-CM | POA: Insufficient documentation

## 2016-03-14 DIAGNOSIS — Z833 Family history of diabetes mellitus: Secondary | ICD-10-CM | POA: Insufficient documentation

## 2016-03-14 DIAGNOSIS — E782 Mixed hyperlipidemia: Secondary | ICD-10-CM

## 2016-03-14 NOTE — Patient Instructions (Signed)
Maintain an active lifestyle.  Aim for at least 30 minutes most days. Continue your healthy eating. Aim for increased non starchy vegetables. Smaller portions of lean meat or chicken, more fish and vegetarian dishes. Continue using whole grains as much as possible.

## 2016-03-14 NOTE — Progress Notes (Signed)
  Medical Nutrition Therapy:  Appt start time: 0845 end time:  0930.   Assessment:  Primary concerns today: Patient is here today with his wife.  Referral for mixed hypertriglyceridemia.Marland Kitchen   He would also like information regarding his continued increasing A1C.  He reports very strong family history of hyperlipidemia and type 2 diabetes.   Labs include (02/05/17):  Cholesterol 308, HDL 61, LDL 206, Triglycerides 210, A1C 6.1%. Weight today: 150 lbs and has gained about 15 lbs recently.  Patient lives with his wife and son.  Patient does most of the shopping and cooking.  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  He has a 1 acre garden and raises much of his own food.  Increased stress with recent death of brothers and mother.  He eats traditional Pakistani food but is conscious to decrease the salt and fat.  He eats meat about 3 times per week.  Preferred Learning Style:   No preference indicated   Learning Readiness:   Ready   MEDICATIONS: see list to include flax seed oil, omega-3, statin   DIETARY INTAKE:  Usual eating pattern includes 3 meals and 0 snacks per day. Everyday foods include traditional Pakistani food.  Avoided foods include processed food and limits eating out to once every 2 weeks..    24-hr recall:  B (9 AM): eggs, toast, chicken or other leftovers, beans, and salad and fruit   Snk ( AM): none  L (2-3 PM): salad, chicken or lamb, bread Snk ( PM): nuts D (9 PM): salad, meat or fish, vegetables, bread (rice about once per week) Snk ( PM): none Beverages: water, 2 glasses red wine, decaf coffee (1-2 per day) with 1 cream and 1 sugar packet or 1/2 tsp sugar or honey  Usual physical activity: Walks 1- 1 1/2 miles before sunrise but less with the cold weather OR bo flex 1-2 times per week for 15 minutes  Estimated energy needs: 1800 calories 200 g carbohydrates 113 g protein 60 g  fat  Progress Towards Goal(s):  In progress.   Nutritional Diagnosis:  NB-1.1 Food and nutrition-related knowledge deficit As related to diet for hyperlipidemia and prediabetes.  As evidenced by A1C of 6.1% and cholesterol of 308.    Intervention:  Nutrition counseling/eduction related to prediabetes and hyperlipidemia.  Discussed diabetes physiology, insulin resistance, and recommended blood sugar readings.  Discussed carbohydrates by portion size, discussed benefits whole grains, and non starchy vegetables and legumes.  Discussed benefits of increased fiber and continued intake of Omega 3.  Discussed saturated vs unsaturated fat and recommendations to avoid saturated fat, eat more plant based meals and small per portions of lean meat.    Maintain an active lifestyle.  Aim for at least 30 minutes most days. Continue your healthy eating. Aim for increased non starchy vegetables. Smaller portions of lean meat or chicken, more fish and vegetarian dishes. Continue using whole grains as much as possible.   Teaching Method Utilized:  Visual Auditory Hands on  Handouts given during visit include: Living Well with Diabetes Food Label handouts Meal Plan Card  Food label  A1C sheet  Barriers to learning/adherence to lifestyle change: non3  Demonstrated degree of understanding via:  Teach Back   Monitoring/Evaluation:  Dietary intake, exercise, and body weight prn.

## 2016-04-04 ENCOUNTER — Other Ambulatory Visit: Payer: Self-pay | Admitting: Internal Medicine

## 2016-04-04 MED ORDER — HYDROCORTISONE 2.5 % EX CREA: TOPICAL_CREAM | Freq: Two times a day (BID) | CUTANEOUS | 0 refills | Status: AC

## 2016-04-08 DIAGNOSIS — B0052 Herpesviral keratitis: Secondary | ICD-10-CM | POA: Diagnosis not present

## 2016-04-08 DIAGNOSIS — H524 Presbyopia: Secondary | ICD-10-CM | POA: Diagnosis not present

## 2016-04-08 DIAGNOSIS — H5231 Anisometropia: Secondary | ICD-10-CM | POA: Diagnosis not present

## 2016-04-08 DIAGNOSIS — H25813 Combined forms of age-related cataract, bilateral: Secondary | ICD-10-CM | POA: Diagnosis not present

## 2016-04-17 IMAGING — DX DG CHEST 2V
2 series · 2 of 2 positions shown · non-contrast
Comparison: 06/10/2014

CLINICAL DATA: Sore throat, oral swelling

EXAM:
CHEST  2 VIEW

[chest pa]
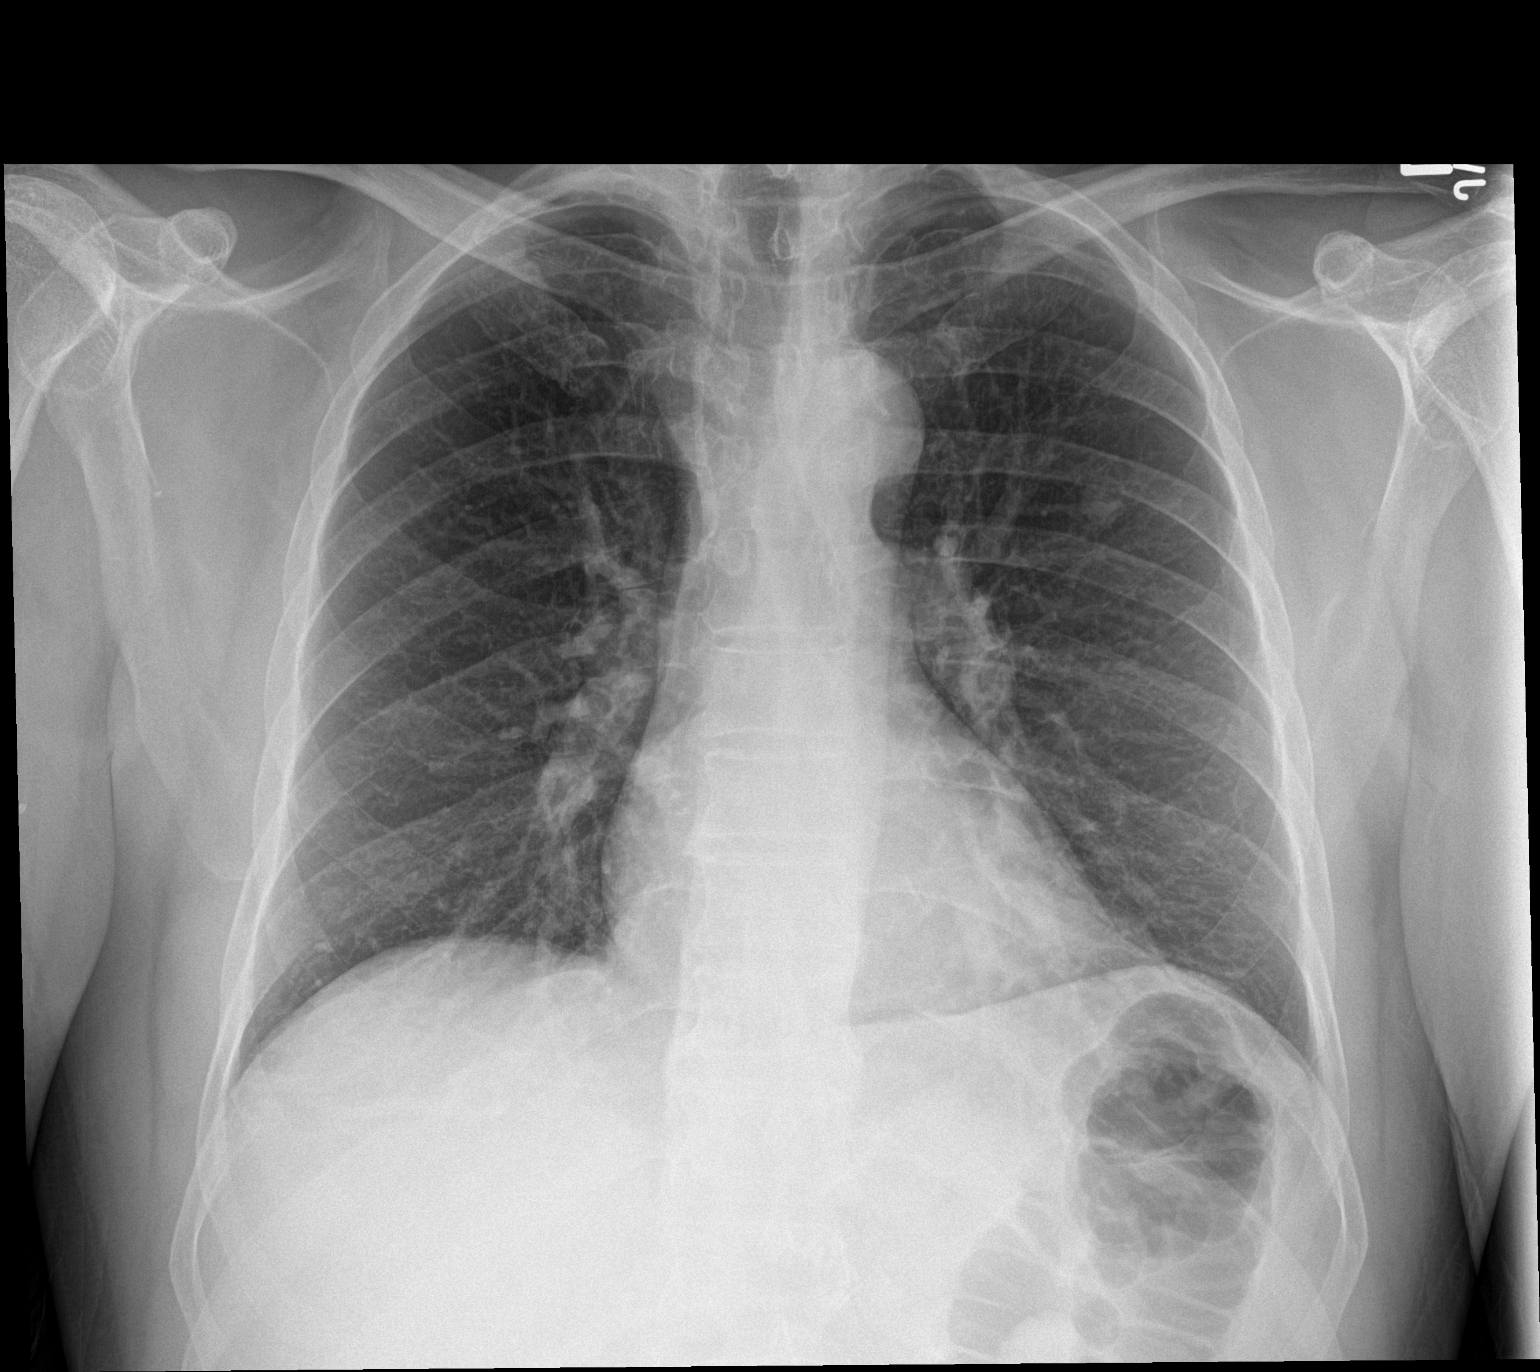

[chest lat]
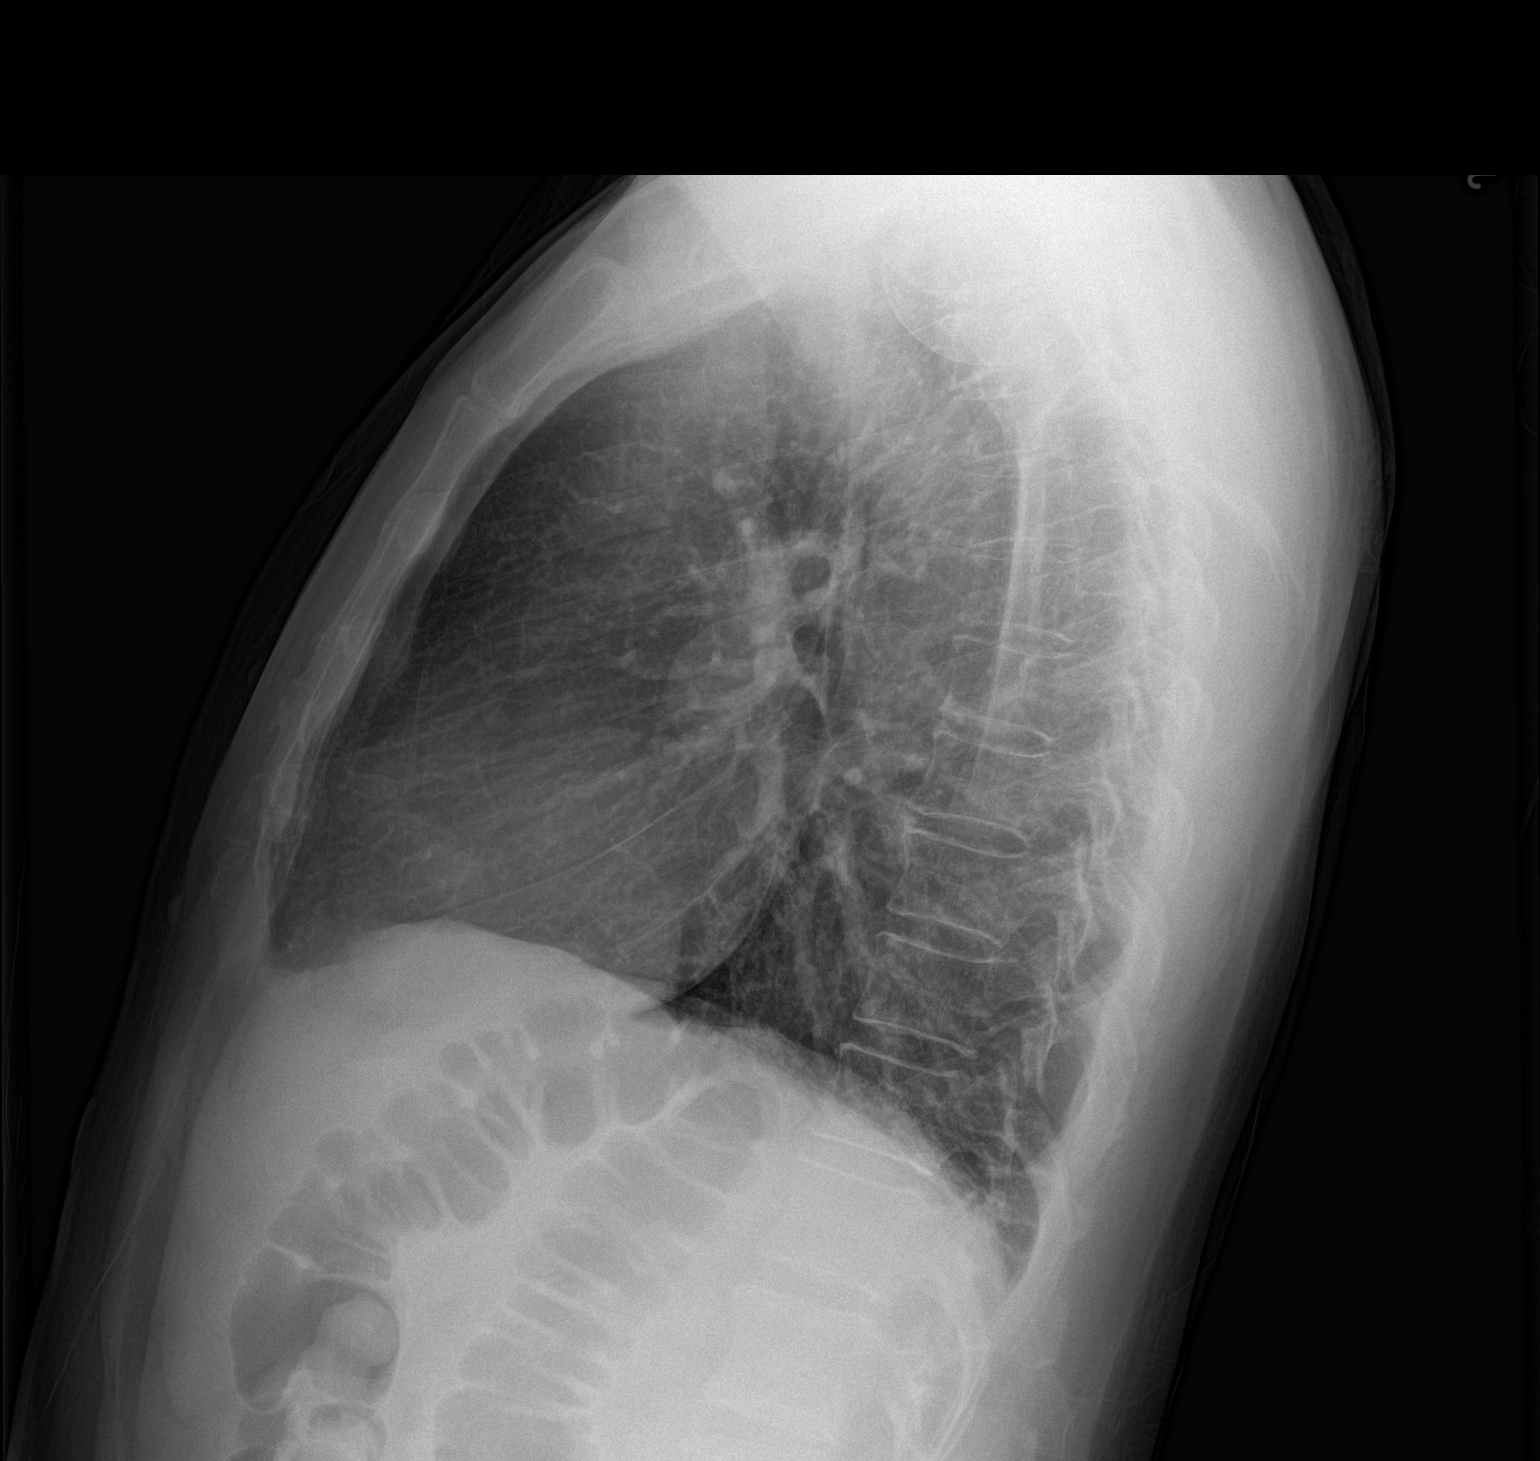

[2 of 2 positions shown; findings below may reference images not displayed]

FINDINGS: Cardiomediastinal silhouette is stable. No acute infiltrate or
pleural effusion. No pulmonary edema. Stable calcified granuloma in
left upper lobe. Bony thorax is unremarkable.
IMPRESSION: No active cardiopulmonary disease.  No significant change.

## 2016-04-29 ENCOUNTER — Telehealth: Payer: Self-pay | Admitting: Family

## 2016-04-29 NOTE — Telephone Encounter (Signed)
Ok with me 

## 2016-04-29 NOTE — Telephone Encounter (Signed)
Yes thanks, I am willing to accept patient. Please make sure he is aware that controlled substance ativan may not be continued under my care. Will discuss at time of visit.

## 2016-04-29 NOTE — Telephone Encounter (Signed)
Pt would like to see if NP Calone would release him and Dr. Yong Channel accept him as a pt due to [place] being closer to his house?

## 2016-04-30 NOTE — Telephone Encounter (Signed)
Called and gave pt the information and pt is aware that Dr. Yong Channel will not do control substance medication under any circumstance and agree that that will be fine with him and he will call back when he is ready to schedule.

## 2016-05-06 ENCOUNTER — Other Ambulatory Visit: Payer: Self-pay

## 2016-05-06 MED ORDER — LORAZEPAM 0.5 MG PO TABS: ORAL_TABLET | ORAL | 0 refills | Status: DC

## 2016-05-07 ENCOUNTER — Telehealth: Payer: Self-pay | Admitting: *Deleted

## 2016-05-07 ENCOUNTER — Telehealth: Payer: Self-pay

## 2016-05-07 MED ORDER — LORAZEPAM 0.5 MG PO TABS: ORAL_TABLET | ORAL | 0 refills | Status: DC

## 2016-05-07 NOTE — Telephone Encounter (Signed)
Pt would like to know if you would accept him as a new patient. Was with Linna Darner for years and wants a male MD to take him on to follow his sugars and kidney function. Pt recently declined to go to Dr. Yong Channel due to not prescribing controlled substance and he is on lorazepam. Please advise.

## 2016-05-07 NOTE — Telephone Encounter (Signed)
[facility] to [facility] on [address]. Pts wife aware.

## 2016-05-07 NOTE — Telephone Encounter (Signed)
Rec'd fax stating prescription for [facility]ep[facility], and since it is a control medication iot can not be transferred.. Per chart not sure which pharmacy rx was sent to forwarding msg to [facility] sent she faxed script...Johny Chess

## 2016-05-07 NOTE — Telephone Encounter (Signed)
Ok with me 

## 2016-05-07 NOTE — Telephone Encounter (Signed)
Pt aware. Set up an establish care appointment for pt.

## 2016-05-22 ENCOUNTER — Other Ambulatory Visit (INDEPENDENT_AMBULATORY_CARE_PROVIDER_SITE_OTHER): Payer: BLUE CROSS/BLUE SHIELD

## 2016-05-22 ENCOUNTER — Ambulatory Visit (INDEPENDENT_AMBULATORY_CARE_PROVIDER_SITE_OTHER): Payer: BLUE CROSS/BLUE SHIELD | Admitting: Internal Medicine

## 2016-05-22 ENCOUNTER — Encounter: Payer: Self-pay | Admitting: Internal Medicine

## 2016-05-22 VITALS — BP 128/84 | HR 75 | Temp 98.2°F | Ht 66.0 in | Wt 147.0 lb

## 2016-05-22 DIAGNOSIS — R7303 Prediabetes: Secondary | ICD-10-CM

## 2016-05-22 DIAGNOSIS — R7989 Other specified abnormal findings of blood chemistry: Secondary | ICD-10-CM | POA: Diagnosis not present

## 2016-05-22 DIAGNOSIS — Z0000 Encounter for general adult medical examination without abnormal findings: Secondary | ICD-10-CM

## 2016-05-22 DIAGNOSIS — E782 Mixed hyperlipidemia: Secondary | ICD-10-CM

## 2016-05-22 LAB — URINALYSIS, ROUTINE W REFLEX MICROSCOPIC
Bilirubin Urine: NEGATIVE
Hgb urine dipstick: NEGATIVE
Ketones, ur: NEGATIVE
Leukocytes, UA: NEGATIVE
Nitrite: NEGATIVE
RBC / HPF: NONE SEEN (ref 0–?)
Specific Gravity, Urine: <=1.005 — AB (ref 1.000–1.030)
Total Protein, Urine: NEGATIVE
Urine Glucose: NEGATIVE
Urobilinogen, UA: 0.2 (ref 0.0–1.0)
pH: 6 (ref 5.0–8.0)

## 2016-05-22 LAB — BASIC METABOLIC PANEL
BUN: 16 mg/dL (ref 6–23)
CO2: 28 mEq/L (ref 19–32)
Calcium: 9.3 mg/dL (ref 8.4–10.5)
Chloride: 103 mEq/L (ref 96–112)
Creatinine, Ser: 1.02 mg/dL (ref 0.40–1.50)
GFR: 79.34 mL/min (ref >60.00)
Glucose, Bld: 97 mg/dL (ref 70–99)
Potassium: 4.2 mEq/L (ref 3.5–5.1)
Sodium: 141 mEq/L (ref 135–145)

## 2016-05-22 LAB — CBC WITH DIFFERENTIAL/PLATELET
Basophils Absolute: 0 10*3/uL (ref 0.0–0.1)
Basophils Relative: 0.4 % (ref 0.0–3.0)
Eosinophils Absolute: 0.1 10*3/uL (ref 0.0–0.7)
Eosinophils Relative: 1.7 % (ref 0.0–5.0)
HCT: 42 % (ref 39.0–52.0)
Hemoglobin: 14 g/dL (ref 13.0–17.0)
Lymphocytes Relative: 31 % (ref 12.0–46.0)
Lymphs Abs: 1.9 10*3/uL (ref 0.7–4.0)
MCHC: 33.4 g/dL (ref 30.0–36.0)
MCV: 83.8 fl (ref 78.0–100.0)
Monocytes Absolute: 0.6 10*3/uL (ref 0.1–1.0)
Monocytes Relative: 10 % (ref 3.0–12.0)
Neutro Abs: 3.5 10*3/uL (ref 1.4–7.7)
Neutrophils Relative %: 56.9 % (ref 43.0–77.0)
Platelets: 188 10*3/uL (ref 150.0–400.0)
RBC: 5.01 Mil/uL (ref 4.22–5.81)
RDW: 13.4 % (ref 11.5–15.5)
WBC: 6.2 10*3/uL (ref 4.0–10.5)

## 2016-05-22 LAB — HEPATIC FUNCTION PANEL
ALT: 28 U/L (ref 0–53)
AST: 19 U/L (ref 0–37)
Albumin: 4.6 g/dL (ref 3.5–5.2)
Alkaline Phosphatase: 47 U/L (ref 39–117)
Bilirubin, Direct: 0.1 mg/dL (ref 0.0–0.3)
Total Bilirubin: 0.4 mg/dL (ref 0.2–1.2)
Total Protein: 7.5 g/dL (ref 6.0–8.3)

## 2016-05-22 LAB — LIPID PANEL
Cholesterol: 313 mg/dL — ABNORMAL HIGH (ref 0–200)
HDL: 58.3 mg/dL (ref >39.00)
Total CHOL/HDL Ratio: 5
Triglycerides: 477 mg/dL — ABNORMAL HIGH (ref 0.0–149.0)

## 2016-05-22 LAB — HEMOGLOBIN A1C: Hgb A1c MFr Bld: 6.2 % (ref 4.6–6.5)

## 2016-05-22 LAB — PSA: PSA: 0.71 ng/mL (ref 0.10–4.00)

## 2016-05-22 LAB — LDL CHOLESTEROL, DIRECT: Direct LDL: 197 mg/dL

## 2016-05-22 LAB — TSH: TSH: 4.03 u[IU]/mL (ref 0.35–4.50)

## 2016-05-22 MED ORDER — HYDROCORTISONE 2.5 % RE CREA: 1.0000 "application " | TOPICAL_CREAM | Freq: Two times a day (BID) | RECTAL | 1 refills | Status: DC

## 2016-05-22 MED ORDER — METFORMIN HCL ER 500 MG PO TB24: 500.0000 mg | ORAL_TABLET | Freq: Every day | ORAL | 3 refills | Status: DC

## 2016-05-22 MED ORDER — DEXLANSOPRAZOLE 60 MG PO CPDR: 1.0000 | DELAYED_RELEASE_CAPSULE | Freq: Every day | ORAL | 3 refills | Status: DC

## 2016-05-22 MED ORDER — ROSUVASTATIN CALCIUM 40 MG PO TABS: 40.0000 mg | ORAL_TABLET | Freq: Every day | ORAL | 3 refills | Status: DC

## 2016-05-22 NOTE — Assessment & Plan Note (Signed)
As they are interested, will start metformin ER 500 qd instead of waiting for likely comiing a1c elevation., for baseline a1c today, then f/u next visit

## 2016-05-22 NOTE — Assessment & Plan Note (Signed)
Very severe elev ldl > 200 chronically, to change pravacol to crestor 40, and advised to take even qod if starts with myalgias

## 2016-05-22 NOTE — Progress Notes (Signed)
Subjective:    Patient ID: Kevin Farley, male    DOB: 1955/12/01, 61 y.o.   MRN: [id number]  HPI  Here for wellness and f/u;  Overall doing ok;  Pt denies Chest pain, worsening SOB, DOE, wheezing, orthopnea, PND, worsening LE edema, palpitations, dizziness or syncope.  Pt denies neurological change such as new headache, facial or extremity weakness.  Pt denies polydipsia, polyuria, or low sugar symptoms. Pt states overall good compliance with treatment and medications, good tolerability, and has been trying to follow appropriate diet.  Pt denies worsening depressive symptoms, suicidal ideation or panic. No fever, night sweats, wt loss, loss of appetite, or other constitutional symptoms.  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  Pt have very strong FH of DM and dialysis, he and wife very interested in further tx.  He is of pak[place] origin.  Unfortunately also only taking half of the pravachol due to some myalgias. Wt Readings from Last 3 Encounters:  05/22/16 147 lb (66.7 kg)  03/14/16 150 lb (68 kg)  02/06/16 148 lb (67.1 kg)  asks for refill anusol HC for hemorrhoids Past Medical History:  Diagnosis Date  . Allergic rhinitis   . Colon polyps    adenomatous 2011 & 2014  . Family history of ischemic heart disease   . GERD (gastroesophageal reflux disease)   . Hyperlipidemia   . Other abnormal glucose    A1c 6% in 06/2009  . Supraspinatus tendon tear    Past Surgical History:  Procedure Laterality Date  . BACK SURGERY  2006   Dr Carloyn Manner, [place]  . cardic catherization  2005   negative; Dr Lyla Son  . colon polyps  2011 & 2014   Dr.Jacobs   . CORNEAL TRANSPLANT Left 1993   Dr Kathrin Penner  . nasal bone surgery  2007  . UPPER GASTROINTESTINAL ENDOSCOPY  2011   mild  gastritis; Dr Ardis Hughs    reports that he has never smoked. He has never used smokeless tobacco. He reports that he drinks about 0.6 oz of alcohol per week . He reports that he does not use drugs. family history includes Diabetes in his brother, brother, father, mother, and sister; Heart attack in his brother and father; Hypertension in his brother, father, mother, and sister; Kidney failure in his brother and brother; Stroke in his mother; Transient ischemic attack in his brother. Allergies  Allergen Reactions  . Aspirin     REACTION: hives States tolerates IBU & acetaminophen OK  . Other     Walnuts & pecans cause hives  . Codeine     Itching with codeine containing cough syrup 9/15  . Zetia [Ezetimibe]     See 09/22/14 musculoskeletal pains  . Ezetimibe-Simvastatin Hives    REACTION: myalgia   Current Outpatient Prescriptions on File Prior to Visit  Medication Sig Dispense Refill  . Coenzyme Q10 (CO Q-10) 100 MG CAPS Take 1 capsule by mouth daily.    . Flaxseed, Linseed, (FLAX SEED OIL PO) Take by mouth.    . fluticasone (FLONASE) 50 MCG/ACT nasal spray PLACE 2 SPRAYS INTO THE NOSE DAILY. AS NEEDED 16 g 5  . hydrocortisone 2.5 % cream Apply topically 2 (two) times daily. 30 g 0  . LORazepam (ATIVAN) 0.5 MG tablet TAKE  1 TABLET BY MOUTH EVERY 8 TO 12 HOURS AS NEEDED 30 tablet 0  . Multiple Vitamin (MULTIVITAMIN) tablet Take 1 tablet by mouth daily.    . Omega-3 Fatty Acids (FISH OIL) 500 MG CAPS Take 1 capsule by mouth daily.    . [DISCONTINUED] Lansoprazole (PREVACID PO) Take by mouth daily.      . [DISCONTINUED] omeprazole (PRILOSEC) 40 MG capsule Take 1 capsule (40 mg total) by mouth daily. 180 capsule 1   No current facility-administered medications on file prior to visit.     Review of Systems Constitutional: Negative for other unusual diaphoresis, sweats, appetite or weight changes HENT: Negative for other worsening hearing loss, ear pain, facial swelling, mouth sores or neck  stiffness.   Eyes: Negative for other worsening pain, redness or other visual disturbance.  Respiratory: Negative for other stridor or swelling Cardiovascular: Negative for other palpitations or other chest pain  Gastrointestinal: Negative for worsening diarrhea or loose stools, blood in stool, distention or other pain Genitourinary: Negative for hematuria, flank pain or other change in urine volume.  Musculoskeletal: Negative for myalgias or other joint swelling.  Skin: Negative for other color change, or other wound or worsening drainage.  Neurological: Negative for other syncope or numbness. Hematological: Negative for other adenopathy or swelling Psychiatric/Behavioral: Negative for hallucinations, other worsening agitation, SI, self-injury, or new decreased concentration All other system neg per pt    Objective:   Physical Exam BP 128/84   Pulse 75   Temp 98.2 F (36.8 C) (Oral)   Ht 5\' 6"  (1.676 m)   Wt 147 lb (66.7 kg)   SpO2 99%   BMI 23.73 kg/m  VS noted,  Constitutional: Pt is oriented to person, place, and time. Appears well-developed and well-nourished, in no significant distress and comfortable Head: Normocephalic and atraumatic  Eyes: Conjunctivae and EOM are normal. Pupils are equal, round, and reactive to light Right Ear: External ear normal without discharge Left Ear: External ear normal without discharge Nose: Nose without discharge or deformity Mouth/Throat: Oropharynx is without other ulcerations and moist  Neck: Normal range of motion. Neck supple. No JVD present. No tracheal deviation present or significant neck LA or mass Cardiovascular: Normal rate, regular rhythm, normal heart sounds and intact distal pulses.   Pulmonary/Chest: WOB normal and breath sounds without rales or wheezing  Abdominal: Soft. Bowel sounds are normal. NT. No HSM  Musculoskeletal: Normal range of motion. Exhibits no edema Lymphadenopathy: Has no other cervical adenopathy.    Neurological: Pt is alert and oriented to person, place, and time. Pt has normal reflexes. No cranial nerve deficit. Motor grossly intact, Gait intact Skin: Skin is warm and dry. No rash noted or new ulcerations Psychiatric:  Has normal mood and affect. Behavior is normal without agitation    Assessment & Plan:

## 2016-05-22 NOTE — Progress Notes (Signed)
Pre visit review using our clinic review tool, if applicable. No additional management support is needed unless otherwise documented below in the visit note. 

## 2016-05-22 NOTE — Assessment & Plan Note (Signed)

## 2016-05-22 NOTE — Patient Instructions (Addendum)
Please take all new medication as prescribed - the metformin ER 500 mg daily, the crestor, and the cream  Please continue all other medications as before, and refills have been done if requested.  Please have the pharmacy call with any other refills you may need.  Please continue your efforts at being more active, low cholesterol diet, and weight control.  You are otherwise up to date with prevention measures today.  Please keep your appointments with your specialists as you may have planned  Please go to the LAB in the Basement (turn left off the elevator) for the tests to be done today  You will be contacted by phone if any changes need to be made immediately.  Otherwise, you will receive a letter about your results with an explanation, but please check with MyChart first.  Please remember to sign up for MyChart if you have not done so, as this will be important to you in the future with finding out test results, communicating by private email, and scheduling acute appointments online when needed.  Please return in 6 months, or sooner if needed, with Lab testing done 3-5 days before

## 2016-07-04 DIAGNOSIS — H5712 Ocular pain, left eye: Secondary | ICD-10-CM | POA: Diagnosis not present

## 2016-07-04 DIAGNOSIS — B0052 Herpesviral keratitis: Secondary | ICD-10-CM | POA: Diagnosis not present

## 2016-07-04 DIAGNOSIS — B0051 Herpesviral iridocyclitis: Secondary | ICD-10-CM | POA: Diagnosis not present

## 2016-07-04 DIAGNOSIS — Z947 Corneal transplant status: Secondary | ICD-10-CM | POA: Diagnosis not present

## 2016-08-28 DIAGNOSIS — Z947 Corneal transplant status: Secondary | ICD-10-CM | POA: Diagnosis not present

## 2016-08-28 DIAGNOSIS — B0052 Herpesviral keratitis: Secondary | ICD-10-CM | POA: Diagnosis not present

## 2016-11-18 ENCOUNTER — Other Ambulatory Visit (INDEPENDENT_AMBULATORY_CARE_PROVIDER_SITE_OTHER): Payer: BLUE CROSS/BLUE SHIELD

## 2016-11-18 DIAGNOSIS — R7303 Prediabetes: Secondary | ICD-10-CM

## 2016-11-18 LAB — BASIC METABOLIC PANEL
BUN: 13 mg/dL (ref 6–23)
CO2: 28 mEq/L (ref 19–32)
Calcium: 9 mg/dL (ref 8.4–10.5)
Chloride: 105 mEq/L (ref 96–112)
Creatinine, Ser: 1.03 mg/dL (ref 0.40–1.50)
GFR: 78.32 mL/min (ref >60.00)
Glucose, Bld: 115 mg/dL — ABNORMAL HIGH (ref 70–99)
Potassium: 4.3 mEq/L (ref 3.5–5.1)
Sodium: 140 mEq/L (ref 135–145)

## 2016-11-18 LAB — LIPID PANEL
Cholesterol: 229 mg/dL — ABNORMAL HIGH (ref 0–200)
HDL: 60.6 mg/dL (ref >39.00)
LDL Cholesterol: 142 mg/dL — ABNORMAL HIGH (ref 0–99)
NonHDL: 168.63
Total CHOL/HDL Ratio: 4
Triglycerides: 133 mg/dL (ref 0.0–149.0)
VLDL: 26.6 mg/dL (ref 0.0–40.0)

## 2016-11-18 LAB — HEPATIC FUNCTION PANEL
ALT: 28 U/L (ref 0–53)
AST: 19 U/L (ref 0–37)
Albumin: 4.2 g/dL (ref 3.5–5.2)
Alkaline Phosphatase: 43 U/L (ref 39–117)
Bilirubin, Direct: 0.1 mg/dL (ref 0.0–0.3)
Total Bilirubin: 0.4 mg/dL (ref 0.2–1.2)
Total Protein: 7.1 g/dL (ref 6.0–8.3)

## 2016-11-18 LAB — HEMOGLOBIN A1C: Hgb A1c MFr Bld: 6.1 % (ref 4.6–6.5)

## 2016-11-20 ENCOUNTER — Ambulatory Visit: Payer: BLUE CROSS/BLUE SHIELD | Admitting: Family

## 2016-11-20 ENCOUNTER — Ambulatory Visit: Payer: BLUE CROSS/BLUE SHIELD | Admitting: Internal Medicine

## 2016-11-21 ENCOUNTER — Encounter: Payer: Self-pay | Admitting: Internal Medicine

## 2016-11-21 ENCOUNTER — Ambulatory Visit (INDEPENDENT_AMBULATORY_CARE_PROVIDER_SITE_OTHER): Payer: BLUE CROSS/BLUE SHIELD | Admitting: Internal Medicine

## 2016-11-21 VITALS — BP 138/84 | HR 80 | Temp 97.8°F | Ht 66.0 in | Wt 150.0 lb

## 2016-11-21 DIAGNOSIS — Z23 Encounter for immunization: Secondary | ICD-10-CM

## 2016-11-21 DIAGNOSIS — R7303 Prediabetes: Secondary | ICD-10-CM | POA: Diagnosis not present

## 2016-11-21 DIAGNOSIS — E785 Hyperlipidemia, unspecified: Secondary | ICD-10-CM

## 2016-11-21 DIAGNOSIS — M542 Cervicalgia: Secondary | ICD-10-CM | POA: Diagnosis not present

## 2016-11-21 DIAGNOSIS — E559 Vitamin D deficiency, unspecified: Secondary | ICD-10-CM

## 2016-11-21 DIAGNOSIS — Z0000 Encounter for general adult medical examination without abnormal findings: Secondary | ICD-10-CM | POA: Diagnosis not present

## 2016-11-21 NOTE — Assessment & Plan Note (Signed)
Also for Vit D level next visit,  to f/u any worsening symptoms or concerns

## 2016-11-21 NOTE — Progress Notes (Addendum)
Subjective:    Patient ID: Kevin Farley, male    DOB: [birth year], 61 y.o.   MRN: [id number]  HPI  Here to f/u; overall doing ok,  Pt denies chest pain, increasing sob or doe, wheezing, orthopnea, PND, increased LE swelling, palpitations, dizziness or syncope.  Pt denies new neurological symptoms such as new headache, or facial or extremity weakness or numbness.  Pt denies polydipsia, polyuria, or low sugar episode.  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  Did have myalgias to the arms with crestor 40 qd, so is currently taking 40 qod.  No new complaints or interval change  Does c/o low cervical midline neck mild discomfort with tingling in the arms occasionally without pain or weakness. Past Medical History:  Diagnosis Date  . Allergic rhinitis   . Colon polyps    adenomatous 2011 & 2014  . Family history of ischemic heart disease   . GERD (gastroesophageal reflux disease)   . Hyperlipidemia   . Other abnormal glucose    A1c 6% in 06/2009  . Supraspinatus tendon tear    Past Surgical History:  Procedure Laterality Date  . BACK SURGERY  2006   Dr Carloyn Manner, [place]  . cardic catherization  2005   negative; Dr Lyla Son  . colon polyps  2011 & 2014   Dr.Jacobs   . CORNEAL TRANSPLANT Left 1993   Dr Kathrin Penner  . nasal bone surgery  2007  . UPPER GASTROINTESTINAL ENDOSCOPY  2011   mild gastritis; Dr Ardis Hughs    reports that he has never smoked. He has never used smokeless tobacco. He reports that he drinks about 0.6 oz of alcohol per week . He reports that he does not use drugs. family history includes Diabetes in his brother, brother, father, mother, and sister; Heart attack in his brother and father; Hypertension in his brother, father, mother, and sister; Kidney failure in his brother and brother; Stroke in his mother; Transient ischemic attack in his  brother. Allergies  Allergen Reactions  . Aspirin     REACTION: hives States tolerates IBU & acetaminophen OK  . Other     Walnuts & pecans cause hives  . Codeine     Itching with codeine containing cough syrup 9/15  . Zetia [Ezetimibe]     See 09/22/14 musculoskeletal pains  . Ezetimibe-Simvastatin Hives    REACTION: myalgia   Current Outpatient Prescriptions on File Prior to Visit  Medication Sig Dispense Refill  . Coenzyme Q10 (CO Q-10) 100 MG CAPS Take 1 capsule by mouth daily.    Marland Kitchen dexlansoprazole (DEXILANT) 60 MG capsule Take 1 capsule (60 mg total) by mouth daily. 90 capsule 3  . Flaxseed, Linseed, (FLAX SEED OIL PO) Take by mouth.    . fluticasone (FLONASE) 50 MCG/ACT nasal spray PLACE 2 SPRAYS INTO THE NOSE DAILY. AS NEEDED 16 g 5  . hydrocortisone (ANUSOL-HC) 2.5 % rectal cream Place 1 application rectally 2 (two) times daily. 30 g 1  . hydrocortisone 2.5 % cream Apply topically 2 (two) times daily. 30 g 0  . LORazepam (ATIVAN) 0.5 MG tablet TAKE 1 TABLET BY MOUTH EVERY 8 TO 12 HOURS AS NEEDED 30 tablet 0  . metFORMIN (GLUCOPHAGE-XR) 500 MG 24 hr tablet Take 1 tablet (500 mg total) by mouth daily with breakfast. 90 tablet 3  . Multiple Vitamin (MULTIVITAMIN) tablet Take 1  tablet by mouth daily.    . Omega-3 Fatty Acids (FISH OIL) 500 MG CAPS Take 1 capsule by mouth daily.    . rosuvastatin (CRESTOR) 40 MG tablet Take 1 tablet (40 mg total) by mouth daily. 90 tablet 3  . [DISCONTINUED] Lansoprazole (PREVACID PO) Take by mouth daily.      . [DISCONTINUED] omeprazole (PRILOSEC) 40 MG capsule Take 1 capsule (40 mg total) by mouth daily. 180 capsule 1   No current facility-administered medications on file prior to visit.    Review of Systems  Constitutional: Negative for other unusual diaphoresis or sweats HENT: Negative for ear discharge or swelling Eyes: Negative for other worsening visual disturbances Respiratory: Negative for stridor or other swelling  Gastrointestinal:  Negative for worsening distension or other blood Genitourinary: Negative for retention or other urinary change Musculoskeletal: Negative for other MSK pain or swelling Skin: Negative for color change or other new lesions Neurological: Negative for worsening tremors and other numbness  Psychiatric/Behavioral: Negative for worsening agitation or other fatigue All other system neg per pt    Objective:   Physical Exam BP 138/84   Pulse 80   Temp 97.8 F (36.6 C) (Oral)   Ht 5\' 6"  (1.676 m)   Wt 150 lb (68 kg)   SpO2 99%   BMI 24.21 kg/m  VS noted,  Constitutional: Pt appears in NAD HENT: Head: NCAT.  Right Ear: External ear normal.  Left Ear: External ear normal.  Eyes: . Pupils are equal, round, and reactive to light. Conjunctivae and EOM are normal Nose: without d/c or deformity Neck: Neck supple. Gross normal ROM Cardiovascular: Normal rate and regular rhythm.   Pulmonary/Chest: Effort normal and breath sounds without rales or wheezing.  Neurological: Pt is alert. At baseline orientation, motor grossly intact Skin: Skin is warm. No rashes, other new lesions, no LE edema Psychiatric: Pt behavior is normal without agitation  No other exam findings  ECG today I have personally interpreted NSR Lab Results  Component Value Date   WBC 6.2 05/22/2016   HGB 14.0 05/22/2016   HCT 42.0 05/22/2016   PLT 188.0 05/22/2016   GLUCOSE 115 (H) 11/18/2016   CHOL 229 (H) 11/18/2016   TRIG 133.0 11/18/2016   HDL 60.60 11/18/2016   LDLDIRECT 197.0 05/22/2016   LDLCALC 142 (H) 11/18/2016   ALT 28 11/18/2016   AST 19 11/18/2016   NA 140 11/18/2016   K 4.3 11/18/2016   CL 105 11/18/2016   CREATININE 1.03 11/18/2016   BUN 13 11/18/2016   CO2 28 11/18/2016   TSH 4.03 05/22/2016   PSA 0.71 05/22/2016   HGBA1C 6.1 11/18/2016      Assessment & Plan:

## 2016-11-21 NOTE — Assessment & Plan Note (Signed)
I suspect underlying DDD and mild neuritic involvement with tingling only to the UE's; no neuro change, ok to continue to monitor for any worsening, tylenol prn for discomfort, consider c spine MRI for worsening

## 2016-11-21 NOTE — Assessment & Plan Note (Signed)
stable overall by history and exam, recent data reviewed with pt, and pt to continue medical treatment as before,  to f/u any worsening symptoms or concerns Lab Results  Component Value Date   HGBA1C 6.1 11/18/2016

## 2016-11-21 NOTE — Patient Instructions (Signed)
Your EKG was OK today  Ok to try to take the crestor 40 mg at 1 pill every day alternating with 1/2 pill every other day  Please continue all other medications as before, and refills have been done if requested.  Please have the pharmacy call with any other refills you may need.  Please continue your efforts at being more active, low cholesterol diabetic diet, and weight control.  Please keep your appointments with your specialists as you may have planned  Please return in 6 months, or sooner if needed, with Lab testing done 3-5 days before

## 2016-11-21 NOTE — Assessment & Plan Note (Signed)
Improved but uncontrolled,  Lab Results  Component Value Date   LDLCALC 142 (H) 11/18/2016  goal < 70, I have asked him to take the crestor 40 qod and 20 qod to see if this is tolerable without worsening myalgias

## 2017-02-10 ENCOUNTER — Other Ambulatory Visit: Payer: Self-pay | Admitting: Internal Medicine

## 2017-05-20 ENCOUNTER — Other Ambulatory Visit (INDEPENDENT_AMBULATORY_CARE_PROVIDER_SITE_OTHER): Payer: BLUE CROSS/BLUE SHIELD

## 2017-05-20 DIAGNOSIS — Z0000 Encounter for general adult medical examination without abnormal findings: Secondary | ICD-10-CM

## 2017-05-20 DIAGNOSIS — E559 Vitamin D deficiency, unspecified: Secondary | ICD-10-CM

## 2017-05-20 DIAGNOSIS — R7303 Prediabetes: Secondary | ICD-10-CM

## 2017-05-20 LAB — BASIC METABOLIC PANEL
BUN: 16 mg/dL (ref 6–23)
CO2: 30 mEq/L (ref 19–32)
Calcium: 9.2 mg/dL (ref 8.4–10.5)
Chloride: 105 mEq/L (ref 96–112)
Creatinine, Ser: 1.07 mg/dL (ref 0.40–1.50)
GFR: 74.82 mL/min (ref >60.00)
Glucose, Bld: 115 mg/dL — ABNORMAL HIGH (ref 70–99)
Potassium: 4.5 mEq/L (ref 3.5–5.1)
Sodium: 143 mEq/L (ref 135–145)

## 2017-05-20 LAB — LIPID PANEL
Cholesterol: 247 mg/dL — ABNORMAL HIGH (ref 0–200)
HDL: 60.2 mg/dL (ref >39.00)
NonHDL: 187.21
Total CHOL/HDL Ratio: 4
Triglycerides: 244 mg/dL — ABNORMAL HIGH (ref 0.0–149.0)
VLDL: 48.8 mg/dL — ABNORMAL HIGH (ref 0.0–40.0)

## 2017-05-20 LAB — CBC WITH DIFFERENTIAL/PLATELET
Basophils Absolute: 0 10*3/uL (ref 0.0–0.1)
Basophils Relative: 0.5 % (ref 0.0–3.0)
Eosinophils Absolute: 0.1 10*3/uL (ref 0.0–0.7)
Eosinophils Relative: 1.4 % (ref 0.0–5.0)
HCT: 40.7 % (ref 39.0–52.0)
Hemoglobin: 13.7 g/dL (ref 13.0–17.0)
Lymphocytes Relative: 16.5 % (ref 12.0–46.0)
Lymphs Abs: 1.2 10*3/uL (ref 0.7–4.0)
MCHC: 33.5 g/dL (ref 30.0–36.0)
MCV: 83.2 fl (ref 78.0–100.0)
Monocytes Absolute: 0.6 10*3/uL (ref 0.1–1.0)
Monocytes Relative: 8.9 % (ref 3.0–12.0)
Neutro Abs: 5.3 10*3/uL (ref 1.4–7.7)
Neutrophils Relative %: 72.7 % (ref 43.0–77.0)
Platelets: 176 10*3/uL (ref 150.0–400.0)
RBC: 4.89 Mil/uL (ref 4.22–5.81)
RDW: 13.6 % (ref 11.5–15.5)
WBC: 7.3 10*3/uL (ref 4.0–10.5)

## 2017-05-20 LAB — PSA: PSA: 0.96 ng/mL (ref 0.10–4.00)

## 2017-05-20 LAB — LDL CHOLESTEROL, DIRECT: Direct LDL: 146 mg/dL

## 2017-05-20 LAB — URINALYSIS, ROUTINE W REFLEX MICROSCOPIC
Bilirubin Urine: NEGATIVE
Hgb urine dipstick: NEGATIVE
Ketones, ur: NEGATIVE
Leukocytes, UA: NEGATIVE
Nitrite: NEGATIVE
RBC / HPF: NONE SEEN (ref 0–?)
Specific Gravity, Urine: >=1.03 — AB (ref 1.000–1.030)
Total Protein, Urine: NEGATIVE
Urine Glucose: NEGATIVE
Urobilinogen, UA: 0.2 (ref 0.0–1.0)
WBC, UA: NONE SEEN (ref 0–?)
pH: 6.5 (ref 5.0–8.0)

## 2017-05-20 LAB — VITAMIN D 25 HYDROXY (VIT D DEFICIENCY, FRACTURES): VITD: 33.72 ng/mL (ref 30.00–100.00)

## 2017-05-20 LAB — HEPATIC FUNCTION PANEL
ALT: 31 U/L (ref 0–53)
AST: 20 U/L (ref 0–37)
Albumin: 4.5 g/dL (ref 3.5–5.2)
Alkaline Phosphatase: 53 U/L (ref 39–117)
Bilirubin, Direct: 0.1 mg/dL (ref 0.0–0.3)
Total Bilirubin: 0.6 mg/dL (ref 0.2–1.2)
Total Protein: 7.3 g/dL (ref 6.0–8.3)

## 2017-05-20 LAB — TSH: TSH: 2.76 u[IU]/mL (ref 0.35–4.50)

## 2017-05-20 LAB — HEMOGLOBIN A1C: Hgb A1c MFr Bld: 6.1 % (ref 4.6–6.5)

## 2017-05-22 ENCOUNTER — Encounter: Payer: Self-pay | Admitting: Internal Medicine

## 2017-05-22 ENCOUNTER — Ambulatory Visit: Payer: BLUE CROSS/BLUE SHIELD | Admitting: Internal Medicine

## 2017-05-22 VITALS — BP 124/76 | HR 80 | Temp 98.3°F | Ht 66.0 in | Wt 146.0 lb

## 2017-05-22 DIAGNOSIS — R7303 Prediabetes: Secondary | ICD-10-CM

## 2017-05-22 DIAGNOSIS — Z0000 Encounter for general adult medical examination without abnormal findings: Secondary | ICD-10-CM | POA: Diagnosis not present

## 2017-05-22 DIAGNOSIS — E559 Vitamin D deficiency, unspecified: Secondary | ICD-10-CM

## 2017-05-22 DIAGNOSIS — E782 Mixed hyperlipidemia: Secondary | ICD-10-CM

## 2017-05-22 MED ORDER — METFORMIN HCL ER 500 MG PO TB24: 500.0000 mg | ORAL_TABLET | Freq: Every day | ORAL | 3 refills | Status: DC

## 2017-05-22 MED ORDER — FLUTICASONE PROPIONATE 50 MCG/ACT NA SUSP: NASAL | 5 refills | Status: DC

## 2017-05-22 MED ORDER — EZETIMIBE 10 MG PO TABS: 10.0000 mg | ORAL_TABLET | Freq: Every day | ORAL | 3 refills | Status: DC

## 2017-05-22 MED ORDER — DEXLANSOPRAZOLE 60 MG PO CPDR: 1.0000 | DELAYED_RELEASE_CAPSULE | Freq: Every day | ORAL | 3 refills | Status: DC

## 2017-05-22 MED ORDER — ROSUVASTATIN CALCIUM 40 MG PO TABS: 40.0000 mg | ORAL_TABLET | ORAL | 3 refills | Status: DC

## 2017-05-22 NOTE — Patient Instructions (Signed)
Your EKG was OK today  Please take all new medication as prescribed - the Zetia 10 mg per day to help further with the cholesterol  Please continue all other medications as before, and refills have been done if requested.  Please have the pharmacy call with any other refills you may need.  Please continue your efforts at being more active, low cholesterol diet, and weight control.  You are otherwise up to date with prevention measures today.  Please keep your appointments with your specialists as you may have planned  Please return in 1 year for your yearly visit, or sooner if needed, with Lab testing done 3-5 days before

## 2017-05-22 NOTE — Progress Notes (Signed)
Subjective:    Patient ID: Kevin Farley, male    DOB: 08-08-1955, 62 y.o.   MRN: [id number]  HPI  Here for wellness and f/u;  Overall doing ok;  Pt denies Chest pain, worsening SOB, DOE, wheezing, orthopnea, PND, worsening LE edema, palpitations, dizziness or syncope.  Pt denies neurological change such as new headache, facial or extremity weakness.  Pt denies polydipsia, polyuria, or low sugar symptoms. Pt states overall good compliance with treatment and medications, good tolerability, and has been trying to follow appropriate diet.  Pt denies worsening depressive symptoms, suicidal ideation or panic. No fever, night sweats, wt loss, loss of appetite, or other constitutional symptoms.  Pt states good ability with ADL's, has low fall risk, home safety reviewed and adequate, no other significant changes in hearing or vision, and daily active with exercise with walking 2 miles per day  No other new complaints or interval hx Wt Readings from Last 3 Encounters:  05/22/17 146 lb (66.2 kg)  11/21/16 150 lb (68 kg)  05/22/16 147 lb (66.7 kg)   BP Readings from Last 3 Encounters:  05/22/17 124/76  11/21/16 138/84  05/22/16 128/84   Past Medical History:  Diagnosis Date  . Allergic rhinitis   . Colon polyps    adenomatous 2011 & 2014  . Family history of ischemic heart disease   . GERD (gastroesophageal reflux disease)   . Hyperlipidemia   . Other abnormal glucose    A1c 6% in 06/2009  . Supraspinatus tendon tear    Past Surgical History:  Procedure Laterality Date  . BACK SURGERY  2006   Dr Carloyn Manner, [place]  . cardic catherization  2005   negative; Dr Lyla Son  . colon polyps  2011 & 2014   Dr.Jacobs   . CORNEAL TRANSPLANT Left 1993   Dr Kathrin Penner  . nasal bone surgery  2007  . UPPER GASTROINTESTINAL ENDOSCOPY  2011   mild gastritis; Dr Ardis Hughs    reports that he has never smoked. He has never used smokeless tobacco. He reports that he drinks about 0.6 oz of alcohol per week. He reports that  he does not use drugs. family history includes Diabetes in his brother, brother, father, mother, and sister; Heart attack in his brother and father; Hypertension in his brother, father, mother, and sister; Kidney failure in his brother and brother; Stroke in his mother; Transient ischemic attack in his brother. Allergies  Allergen Reactions  . Aspirin     REACTION: hives States tolerates IBU & acetaminophen OK  . Other     Walnuts & pecans cause hives  . Codeine     Itching with codeine containing cough syrup 9/15  . Zetia [Ezetimibe]     See 09/22/14 musculoskeletal pains  . Ezetimibe-Simvastatin Hives    REACTION: myalgia   Current Outpatient Medications on File Prior to Visit  Medication Sig Dispense Refill  . Coenzyme Q10 (CO Q-10) 100 MG CAPS Take 1 capsule by mouth daily.    . Flaxseed, Linseed, (FLAX SEED OIL PO) Take by mouth.    Marland Kitchen LORazepam (ATIVAN) 0.5 MG tablet TAKE 1 TABLET BY MOUTH EVERY 8 TO 12 HOURS AS NEEDED 30 tablet 0  . Multiple Vitamin (MULTIVITAMIN) tablet Take 1 tablet by mouth daily.    . Omega-3 Fatty Acids (FISH OIL) 500 MG CAPS Take 1 capsule by mouth daily.    Marland Kitchen PROCTOSOL HC 2.5 % rectal cream PLACE 1 APPLICATION RECTALLY 2 (TWO) TIMES DAILY. 28.35 g 1  . [  DISCONTINUED] Lansoprazole (PREVACID PO) Take by mouth daily.      . [DISCONTINUED] omeprazole (PRILOSEC) 40 MG capsule Take 1 capsule (40 mg total) by mouth daily. 180 capsule 1   No current facility-administered medications on file prior to visit.    Review of Systems Constitutional: Negative for other unusual diaphoresis, sweats, appetite or weight changes HENT: Negative for other worsening hearing loss, ear pain, facial swelling, mouth sores or neck stiffness.   Eyes: Negative for other worsening pain, redness or other visual disturbance.  Respiratory: Negative for other stridor or swelling Cardiovascular: Negative for other palpitations or other chest pain  Gastrointestinal: Negative for worsening  diarrhea or loose stools, blood in stool, distention or other pain Genitourinary: Negative for hematuria, flank pain or other change in urine volume.  Musculoskeletal: Negative for myalgias or other joint swelling.  Skin: Negative for other color change, or other wound or worsening drainage.  Neurological: Negative for other syncope or numbness. Hematological: Negative for other adenopathy or swelling Psychiatric/Behavioral: Negative for hallucinations, other worsening agitation, SI, self-injury, or new decreased concentration All other system neg per pt    Objective:   Physical Exam BP 124/76   Pulse 80   Temp 98.3 F (36.8 C) (Oral)   Ht 5\' 6"  (1.676 m)   Wt 146 lb (66.2 kg)   SpO2 97%   BMI 23.57 kg/m  VS noted,  Constitutional: Pt is oriented to person, place, and time. Appears well-developed and well-nourished, in no significant distress and comfortable Head: Normocephalic and atraumatic  Eyes: Conjunctivae and EOM are normal. Pupils are equal, round, and reactive to light Right Ear: External ear normal without discharge Left Ear: External ear normal without discharge Nose: Nose without discharge or deformity Mouth/Throat: Oropharynx is without other ulcerations and moist  Neck: Normal range of motion. Neck supple. No JVD present. No tracheal deviation present or significant neck LA or mass Cardiovascular: Normal rate, regular rhythm, normal heart sounds and intact distal pulses.   Pulmonary/Chest: WOB normal and breath sounds without rales or wheezing  Abdominal: Soft. Bowel sounds are normal. NT. No HSM  Musculoskeletal: Normal range of motion. Exhibits no edema Lymphadenopathy: Has no other cervical adenopathy.  Neurological: Pt is alert and oriented to person, place, and time. Pt has normal reflexes. No cranial nerve deficit. Motor grossly intact, Gait intact Skin: Skin is warm and dry. No rash noted or new ulcerations Psychiatric:  Has normal mood and affect. Behavior is  normal without agitation No other exam findings  Lab Results  Component Value Date   WBC 7.3 05/20/2017   HGB 13.7 05/20/2017   HCT 40.7 05/20/2017   PLT 176.0 05/20/2017   GLUCOSE 115 (H) 05/20/2017   CHOL 247 (H) 05/20/2017   TRIG 244.0 (H) 05/20/2017   HDL 60.20 05/20/2017   LDLDIRECT 146.0 05/20/2017   LDLCALC 142 (H) 11/18/2016   ALT 31 05/20/2017   AST 20 05/20/2017   NA 143 05/20/2017   K 4.5 05/20/2017   CL 105 05/20/2017   CREATININE 1.07 05/20/2017   BUN 16 05/20/2017   CO2 30 05/20/2017   TSH 2.76 05/20/2017   PSA 0.96 05/20/2017   HGBA1C 6.1 05/20/2017   ECG - today I have personally interpreted: NSR without ischemic changes    Assessment & Plan:

## 2017-05-24 NOTE — Assessment & Plan Note (Signed)
For Vit D level with labs

## 2017-05-24 NOTE — Assessment & Plan Note (Signed)

## 2017-05-24 NOTE — Assessment & Plan Note (Signed)
stable overall by history and exam, recent data reviewed with pt, and pt to continue medical treatment as before,  to f/u any worsening symptoms or concerns Lab Results  Component Value Date   HGBA1C 6.1 05/20/2017

## 2017-05-24 NOTE — Assessment & Plan Note (Signed)
Ok to add zetia 10 qd, lower chol diet

## 2017-06-24 ENCOUNTER — Ambulatory Visit: Payer: BLUE CROSS/BLUE SHIELD | Admitting: Internal Medicine

## 2017-06-24 ENCOUNTER — Encounter: Payer: Self-pay | Admitting: Internal Medicine

## 2017-06-24 VITALS — BP 136/86 | HR 89 | Temp 98.4°F | Ht 66.0 in | Wt 142.0 lb

## 2017-06-24 DIAGNOSIS — J069 Acute upper respiratory infection, unspecified: Secondary | ICD-10-CM | POA: Insufficient documentation

## 2017-06-24 DIAGNOSIS — R7303 Prediabetes: Secondary | ICD-10-CM

## 2017-06-24 DIAGNOSIS — E782 Mixed hyperlipidemia: Secondary | ICD-10-CM

## 2017-06-24 MED ORDER — LEVOFLOXACIN 500 MG PO TABS: 500.0000 mg | ORAL_TABLET | Freq: Every day | ORAL | 0 refills | Status: AC

## 2017-06-24 MED ORDER — HYDROCODONE-HOMATROPINE 5-1.5 MG/5ML PO SYRP: 5.0000 mL | ORAL_SOLUTION | Freq: Four times a day (QID) | ORAL | 0 refills | Status: AC | PRN

## 2017-06-24 NOTE — Patient Instructions (Signed)
Please take all new medication as prescribed - the antibiotic, and cough medicine as needed  Please continue all other medications as before, and refills have been done if requested.  Please have the pharmacy call with any other refills you may need.  Please keep your appointments with your specialists as you may have planned   

## 2017-06-24 NOTE — Assessment & Plan Note (Signed)
Tolerating new crestor, ok to continue, declines f/u lab today, cont low chol diet

## 2017-06-24 NOTE — Assessment & Plan Note (Signed)
Mild to mod, for antibx course,  to f/u any worsening symptoms or concerns 

## 2017-06-24 NOTE — Progress Notes (Signed)
Subjective:    Patient ID: Kevin Farley, male    DOB: 26-Jun-1955, 62 y.o.   MRN: [id number]  HPI   Here with 2-3 days acute onset fever, facial pain, pressure, headache, general weakness and malaise, and greenish d/c, with mild ST and cough, but pt denies chest pain, wheezing, increased sob or doe, orthopnea, PND, increased LE swelling, palpitations, dizziness or syncope.  Pt denies new neurological symptoms such as new headache, or facial or extremity weakness or numbness   Pt denies polydipsia, polyuria  No other new complaints or interval hx Past Medical History:  Diagnosis Date  . Allergic rhinitis   . Colon polyps    adenomatous 2011 & 2014  . Family history of ischemic heart disease   . GERD (gastroesophageal reflux disease)   . Hyperlipidemia   . Other abnormal glucose    A1c 6% in 06/2009  . Supraspinatus tendon tear    Past Surgical History:  Procedure Laterality Date  . BACK SURGERY  2006   Dr Carloyn Manner, [place]  . cardic catherization  2005   negative; Dr Lyla Son  . colon polyps  2011 & 2014   Dr.Jacobs   . CORNEAL TRANSPLANT Left 1993   Dr Kathrin Penner  . nasal bone surgery  2007  . UPPER GASTROINTESTINAL ENDOSCOPY  2011   mild gastritis; Dr Ardis Hughs    reports that he has never smoked. He has never used smokeless tobacco. He reports that he drinks about 0.6 oz of alcohol per week. He reports that he does not use drugs. family history includes Diabetes in his brother, brother, father, mother, and sister; Heart attack in his brother and father; Hypertension in his brother, father, mother, and sister; Kidney failure in his brother and brother; Stroke in his mother; Transient ischemic attack in his brother. Allergies  Allergen Reactions  . Aspirin     REACTION: hives States tolerates IBU & acetaminophen OK  . Other     Walnuts & pecans cause hives  . Codeine     Itching with codeine containing cough syrup 9/15  . Zetia [Ezetimibe]     See 09/22/14 musculoskeletal pains  .  Ezetimibe-Simvastatin Hives    REACTION: myalgia   Current Outpatient Medications on File Prior to Visit  Medication Sig Dispense Refill  . Coenzyme Q10 (CO Q-10) 100 MG CAPS Take 1 capsule by mouth daily.    Marland Kitchen dexlansoprazole (DEXILANT) 60 MG capsule Take 1 capsule (60 mg total) by mouth daily. 90 capsule 3  . ezetimibe (ZETIA) 10 MG tablet Take 1 tablet (10 mg total) by mouth daily. 90 tablet 3  . Flaxseed, Linseed, (FLAX SEED OIL PO) Take by mouth.    . fluticasone (FLONASE) 50 MCG/ACT nasal spray PLACE 2 SPRAYS INTO THE NOSE DAILY. AS NEEDED 16 g 5  . LORazepam (ATIVAN) 0.5 MG tablet TAKE 1 TABLET BY MOUTH EVERY 8 TO 12 HOURS AS NEEDED 30 tablet 0  . metFORMIN (GLUCOPHAGE-XR) 500 MG 24 hr tablet Take 1 tablet (500 mg total) by mouth daily with breakfast. 90 tablet 3  . Multiple Vitamin (MULTIVITAMIN) tablet Take 1 tablet by mouth daily.    . Omega-3 Fatty Acids (FISH OIL) 500 MG CAPS Take 1 capsule by mouth daily.    Marland Kitchen PROCTOSOL HC 2.5 % rectal cream PLACE 1 APPLICATION RECTALLY 2 (TWO) TIMES DAILY. 28.35 g 1  . rosuvastatin (CRESTOR) 40 MG tablet Take 1 tablet (40 mg total) by mouth every other day. 45 tablet 3  . [  DISCONTINUED] Lansoprazole (PREVACID PO) Take by mouth daily.      . [DISCONTINUED] omeprazole (PRILOSEC) 40 MG capsule Take 1 capsule (40 mg total) by mouth daily. 180 capsule 1   No current facility-administered medications on file prior to visit.    Review of Systems  Constitutional: Negative for other unusual diaphoresis or sweats HENT: Negative for ear discharge or swelling Eyes: Negative for other worsening visual disturbances Respiratory: Negative for stridor or other swelling  Gastrointestinal: Negative for worsening distension or other blood Genitourinary: Negative for retention or other urinary change Musculoskeletal: Negative for other MSK pain or swelling Skin: Negative for color change or other new lesions Neurological: Negative for worsening tremors and  other numbness  Psychiatric/Behavioral: Negative for worsening agitation or other fatigue All other system neg per pt    Objective:   Physical Exam BP 136/86   Pulse 89   Temp 98.4 F (36.9 C) (Oral)   Ht 5\' 6"  (1.676 m)   Wt 142 lb (64.4 kg)   SpO2 98%   BMI 22.92 kg/m  VS noted, mild ill Constitutional: Pt appears in NAD HENT: Head: NCAT.  Right Ear: External ear normal.  Left Ear: External ear normal.  Bilat tm's with mild erythema.  Max sinus areas mild tender.  Pharynx with mild erythema, no exudate Eyes: . Pupils are equal, round, and reactive to light. Conjunctivae and EOM are normal Nose: without d/c or deformity Neck: Neck supple. Gross normal ROM Cardiovascular: Normal rate and regular rhythm.   Pulmonary/Chest: Effort normal and breath sounds without rales or wheezing.  Neurological: Pt is alert. At baseline orientation, motor grossly intact Skin: Skin is warm. No rashes, other new lesions, no LE edema Psychiatric: Pt behavior is normal without agitation  No other exam findings    Assessment & Plan:

## 2017-06-24 NOTE — Assessment & Plan Note (Signed)
stable overall by history and exam, recent data reviewed with pt, and pt to continue medical treatment as before,  to f/u any worsening symptoms or concerns Lab Results  Component Value Date   HGBA1C 6.1 05/20/2017

## 2017-07-18 DIAGNOSIS — H5212 Myopia, left eye: Secondary | ICD-10-CM | POA: Diagnosis not present

## 2017-07-18 DIAGNOSIS — H2513 Age-related nuclear cataract, bilateral: Secondary | ICD-10-CM | POA: Diagnosis not present

## 2017-07-18 DIAGNOSIS — E119 Type 2 diabetes mellitus without complications: Secondary | ICD-10-CM | POA: Diagnosis not present

## 2017-07-18 DIAGNOSIS — H52202 Unspecified astigmatism, left eye: Secondary | ICD-10-CM | POA: Diagnosis not present

## 2017-07-18 LAB — HM DIABETES EYE EXAM

## 2017-10-08 DIAGNOSIS — H9113 Presbycusis, bilateral: Secondary | ICD-10-CM | POA: Insufficient documentation

## 2017-10-08 DIAGNOSIS — H903 Sensorineural hearing loss, bilateral: Secondary | ICD-10-CM | POA: Diagnosis not present

## 2017-10-10 ENCOUNTER — Other Ambulatory Visit: Payer: Self-pay | Admitting: Internal Medicine

## 2017-11-11 ENCOUNTER — Telehealth: Payer: Self-pay | Admitting: Internal Medicine

## 2017-11-11 NOTE — Telephone Encounter (Signed)
I do not see that an upcoming appointment is scheduled but patient already has lab work ordered for a CPE that would be due in April 2020.  If he needs an appointment prior to April then PCP would need to see him first for evaluation then PCP will determine what lab work is needed.

## 2017-11-11 NOTE — Telephone Encounter (Signed)
Copied from [place] [id number]. Topic: Quick Communication - See Telephone Encounter >> Nov 11, 2017 11:23 AM Gardiner Ramus wrote: CRM for notification. See Telephone encounter for: 11/11/17. Pt wife called and stated that her husband would like to have lab work done and then come in for an appointment. Please advise [phone]

## 2017-11-11 NOTE — Telephone Encounter (Signed)
6 mo fu scheduled per patients wife. Patient does not want to come once a year and prefers to dicuss lab results with MD

## 2017-12-16 ENCOUNTER — Other Ambulatory Visit (INDEPENDENT_AMBULATORY_CARE_PROVIDER_SITE_OTHER): Payer: BLUE CROSS/BLUE SHIELD

## 2017-12-16 ENCOUNTER — Encounter: Payer: Self-pay | Admitting: Internal Medicine

## 2017-12-16 ENCOUNTER — Ambulatory Visit: Payer: BLUE CROSS/BLUE SHIELD | Admitting: Internal Medicine

## 2017-12-16 VITALS — BP 124/82 | HR 77 | Temp 98.1°F | Ht 66.0 in | Wt 143.0 lb

## 2017-12-16 DIAGNOSIS — N320 Bladder-neck obstruction: Secondary | ICD-10-CM

## 2017-12-16 DIAGNOSIS — R7303 Prediabetes: Secondary | ICD-10-CM

## 2017-12-16 DIAGNOSIS — E559 Vitamin D deficiency, unspecified: Secondary | ICD-10-CM

## 2017-12-16 DIAGNOSIS — Z23 Encounter for immunization: Secondary | ICD-10-CM | POA: Diagnosis not present

## 2017-12-16 DIAGNOSIS — E782 Mixed hyperlipidemia: Secondary | ICD-10-CM | POA: Diagnosis not present

## 2017-12-16 LAB — LDL CHOLESTEROL, DIRECT: Direct LDL: 138 mg/dL

## 2017-12-16 LAB — BASIC METABOLIC PANEL
BUN: 15 mg/dL (ref 6–23)
CO2: 30 mEq/L (ref 19–32)
Calcium: 9.3 mg/dL (ref 8.4–10.5)
Chloride: 104 mEq/L (ref 96–112)
Creatinine, Ser: 1.02 mg/dL (ref 0.40–1.50)
GFR: 78.92 mL/min (ref >60.00)
Glucose, Bld: 113 mg/dL — ABNORMAL HIGH (ref 70–99)
Potassium: 4.2 mEq/L (ref 3.5–5.1)
Sodium: 141 mEq/L (ref 135–145)

## 2017-12-16 LAB — HEMOGLOBIN A1C: Hgb A1c MFr Bld: 6 % (ref 4.6–6.5)

## 2017-12-16 LAB — HEPATIC FUNCTION PANEL
ALT: 30 U/L (ref 0–53)
AST: 22 U/L (ref 0–37)
Albumin: 4.6 g/dL (ref 3.5–5.2)
Alkaline Phosphatase: 47 U/L (ref 39–117)
Bilirubin, Direct: 0.1 mg/dL (ref 0.0–0.3)
Total Bilirubin: 0.5 mg/dL (ref 0.2–1.2)
Total Protein: 7.5 g/dL (ref 6.0–8.3)

## 2017-12-16 LAB — LIPID PANEL
Cholesterol: 242 mg/dL — ABNORMAL HIGH (ref 0–200)
HDL: 65.2 mg/dL (ref >39.00)
NonHDL: 176.52
Total CHOL/HDL Ratio: 4
Triglycerides: 246 mg/dL — ABNORMAL HIGH (ref 0.0–149.0)
VLDL: 49.2 mg/dL — ABNORMAL HIGH (ref 0.0–40.0)

## 2017-12-16 NOTE — Patient Instructions (Addendum)
You had the flu shot today  Please continue all other medications as before, and refills have been done if requested.  Please have the pharmacy call with any other refills you may need.  Please continue your efforts at being more active, low cholesterol diet, and weight control.  Please keep your appointments with your specialists as you may have planned  Please go to the LAB in the Basement (turn left off the elevator) for the tests to be done today  You will be contacted by phone if any changes need to be made immediately.  Otherwise, you will receive a letter about your results with an explanation, but please check with MyChart first.  Please remember to sign up for MyChart if you have not done so, as this will be important to you in the future with finding out test results, communicating by private email, and scheduling acute appointments online when needed.  Please return in 6 months, or sooner if needed, with Lab testing done 3-5 days before

## 2017-12-16 NOTE — Assessment & Plan Note (Signed)
Likely genetic; to cont high dose crestor, has been intolerant of zetia in past; for lipids, consider lipid clinic for praluent type med if not improved to goal LDL < 100

## 2017-12-16 NOTE — Progress Notes (Signed)
Subjective:    Patient ID: Kevin Farley, male    DOB: Mar 30, 1955, 61 y.o.   MRN: [id number]  HPI  Here to f/u; overall doing ok,  Pt denies chest pain, increasing sob or doe, wheezing, orthopnea, PND, increased LE swelling, palpitations, dizziness or syncope.  Pt denies new neurological symptoms such as new headache, or facial or extremity weakness or numbness.  Pt denies polydipsia, polyuria, or low sugar episode.  Pt states overall good compliance with meds, mostly trying to follow appropriate diet, with wt overall stable,  but little exercise however.  Good compliance.  Has had incrased hemorrhoid discomfort but very mild and the cream helps.    Past Medical History:  Diagnosis Date  . Allergic rhinitis   . Colon polyps    adenomatous 2011 & 2014  . Family history of ischemic heart disease   . GERD (gastroesophageal reflux disease)   . Hyperlipidemia   . Other abnormal glucose    A1c 6% in 06/2009  . Supraspinatus tendon tear    Past Surgical History:  Procedure Laterality Date  . BACK SURGERY  2006   Dr Carloyn Manner, [place]  . cardic catherization  2005   negative; Dr Lyla Son  . colon polyps  2011 & 2014   Dr.Jacobs   . CORNEAL TRANSPLANT Left 1993   Dr Kathrin Penner  . nasal bone surgery  2007  . UPPER GASTROINTESTINAL ENDOSCOPY  2011   mild gastritis; Dr Ardis Hughs    reports that he has never smoked. He has never used smokeless tobacco. He reports that he drinks about 1.0 standard drinks of alcohol per week. He reports that he does not use drugs. family history includes Diabetes in his brother, brother, father, mother, and sister; Heart attack in his brother and father; Hypertension in his brother, father, mother, and sister; Kidney failure in his brother and brother; Stroke in his mother; Transient ischemic attack in his brother. Allergies  Allergen Reactions  . Aspirin     REACTION: hives States tolerates IBU & acetaminophen OK  . Other     Walnuts & pecans cause hives  . Codeine    Itching with codeine containing cough syrup 9/15  . Zetia [Ezetimibe]     See 09/22/14 musculoskeletal pains  . Ezetimibe-Simvastatin Hives    REACTION: myalgia   Current Outpatient Medications on File Prior to Visit  Medication Sig Dispense Refill  . Coenzyme Q10 (CO Q-10) 100 MG CAPS Take 1 capsule by mouth daily.    Marland Kitchen dexlansoprazole (DEXILANT) 60 MG capsule Take 1 capsule (60 mg total) by mouth daily. 90 capsule 3  . ezetimibe (ZETIA) 10 MG tablet Take 1 tablet (10 mg total) by mouth daily. 90 tablet 3  . Flaxseed, Linseed, (FLAX SEED OIL PO) Take by mouth.    . fluticasone (FLONASE) 50 MCG/ACT nasal spray PLACE 2 SPRAYS INTO THE NOSE DAILY. AS NEEDED 16 g 5  . LORazepam (ATIVAN) 0.5 MG tablet TAKE 1 TABLET BY MOUTH EVERY 8 TO 12 HOURS AS NEEDED 30 tablet 0  . metFORMIN (GLUCOPHAGE-XR) 500 MG 24 hr tablet Take 1 tablet (500 mg total) by mouth daily with breakfast. 90 tablet 3  . Multiple Vitamin (MULTIVITAMIN) tablet Take 1 tablet by mouth daily.    . Omega-3 Fatty Acids (FISH OIL) 500 MG CAPS Take 1 capsule by mouth daily.    Marland Kitchen PROCTOSOL HC 2.5 % rectal cream PLACE 1 APPLICATION RECTALLY 2 (TWO) TIMES DAILY. 28.35 g 1  . rosuvastatin (CRESTOR) 40  MG tablet Take 1 tablet (40 mg total) by mouth every other day. 45 tablet 3  . [DISCONTINUED] Lansoprazole (PREVACID PO) Take by mouth daily.      . [DISCONTINUED] omeprazole (PRILOSEC) 40 MG capsule Take 1 capsule (40 mg total) by mouth daily. 180 capsule 1   No current facility-administered medications on file prior to visit.    Review of Systems  Constitutional: Negative for other unusual diaphoresis or sweats HENT: Negative for ear discharge or swelling Eyes: Negative for other worsening visual disturbances Respiratory: Negative for stridor or other swelling  Gastrointestinal: Negative for worsening distension or other blood Genitourinary: Negative for retention or other urinary change Musculoskeletal: Negative for other MSK pain or  swelling Skin: Negative for color change or other new lesions Neurological: Negative for worsening tremors and other numbness  Psychiatric/Behavioral: Negative for worsening agitation or other fatigue All other system neg per pt    Objective:   Physical Exam BP 124/82   Pulse 77   Temp 98.1 F (36.7 C) (Oral)   Ht 5\' 6"  (1.676 m)   Wt 143 lb (64.9 kg)   SpO2 99%   BMI 23.08 kg/m  VS noted,  Constitutional: Pt appears in NAD HENT: Head: NCAT.  Right Ear: External ear normal.  Left Ear: External ear normal.  Eyes: . Pupils are equal, round, and reactive to light. Conjunctivae and EOM are normal Nose: without d/c or deformity Neck: Neck supple. Gross normal ROM Cardiovascular: Normal rate and regular rhythm.   Pulmonary/Chest: Effort normal and breath sounds without rales or wheezing.  Abd:  Soft, NT, ND, + BS, no organomegaly Neurological: Pt is alert. At baseline orientation, motor grossly intact Skin: Skin is warm. No rashes, other new lesions, no LE edema Psychiatric: Pt behavior is normal without agitation  No other exam findings Lab Results  Component Value Date   WBC 7.3 05/20/2017   HGB 13.7 05/20/2017   HCT 40.7 05/20/2017   PLT 176.0 05/20/2017   GLUCOSE 115 (H) 05/20/2017   CHOL 247 (H) 05/20/2017   TRIG 244.0 (H) 05/20/2017   HDL 60.20 05/20/2017   LDLDIRECT 146.0 05/20/2017   LDLCALC 142 (H) 11/18/2016   ALT 31 05/20/2017   AST 20 05/20/2017   NA 143 05/20/2017   K 4.5 05/20/2017   CL 105 05/20/2017   CREATININE 1.07 05/20/2017   BUN 16 05/20/2017   CO2 30 05/20/2017   TSH 2.76 05/20/2017   PSA 0.96 05/20/2017   HGBA1C 6.1 05/20/2017        Assessment & Plan:

## 2017-12-19 NOTE — Assessment & Plan Note (Signed)
stable overall by history and exam, recent data reviewed with pt, and pt to continue medical treatment as before,  to f/u any worsening symptoms or concerns  

## 2017-12-19 NOTE — Assessment & Plan Note (Signed)
Also for vit d f/u,  to f/u any worsening symptoms or concerns, cont replacement

## 2017-12-22 ENCOUNTER — Other Ambulatory Visit: Payer: Self-pay | Admitting: Internal Medicine

## 2018-02-01 ENCOUNTER — Encounter: Payer: Self-pay | Admitting: Gastroenterology

## 2018-02-07 ENCOUNTER — Other Ambulatory Visit: Payer: Self-pay | Admitting: Internal Medicine

## 2018-02-16 DIAGNOSIS — H43811 Vitreous degeneration, right eye: Secondary | ICD-10-CM | POA: Diagnosis not present

## 2018-02-16 DIAGNOSIS — H5310 Unspecified subjective visual disturbances: Secondary | ICD-10-CM | POA: Diagnosis not present

## 2018-02-17 ENCOUNTER — Ambulatory Visit: Payer: BLUE CROSS/BLUE SHIELD | Admitting: Physician Assistant

## 2018-02-17 ENCOUNTER — Encounter: Payer: Self-pay | Admitting: Physician Assistant

## 2018-02-17 VITALS — BP 140/84 | HR 94 | Temp 98.6°F | Ht 66.0 in | Wt 142.2 lb

## 2018-02-17 DIAGNOSIS — J069 Acute upper respiratory infection, unspecified: Secondary | ICD-10-CM

## 2018-02-17 MED ORDER — HYDROCORTISONE 2.5 % RE CREA: TOPICAL_CREAM | RECTAL | 1 refills | Status: DC

## 2018-02-17 MED ORDER — DOXYCYCLINE HYCLATE 100 MG PO TABS: 100.0000 mg | ORAL_TABLET | Freq: Two times a day (BID) | ORAL | 0 refills | Status: DC

## 2018-02-17 NOTE — Patient Instructions (Signed)
It was great to see you!  Use medication as prescribed: oral doxycycline  Push fluids and get plenty of rest. Please return if you are not improving as expected, or if you have high fevers (>101.5) or difficulty swallowing or worsening productive cough.  Call clinic with questions.  I hope you start feeling better soon!

## 2018-02-17 NOTE — Progress Notes (Signed)
Kevin Farley is a 63 y.o. male here for a new problem.  History of Present Illness:   Chief Complaint  Patient presents with  . Sore Throat  . Cough    HPI   Patient has had 7-8 days of sore throat, cough, congestion. He is eating and drinking well. Denies n/v/d/c. He has not had fevers. He denies: CP, SOB, neck stiffness. Coughing up green now.   Past Medical History:  Diagnosis Date  . Allergic rhinitis   . Colon polyps    adenomatous 2011 & 2014  . Family history of ischemic heart disease   . GERD (gastroesophageal reflux disease)   . Hyperlipidemia   . Other abnormal glucose    A1c 6% in 06/2009  . Supraspinatus tendon tear      Social History   Socioeconomic History  . Marital status: Married    Spouse name: Not on file  . Number of children: 1  . Years of education: 76  . Highest education level: Not on file  Occupational History  . Occupation: Freight forwarder of hotels   . Occupation: OWNER    Employer: [employer]  . Financial resource strain: Not on file  . Food insecurity:    Worry: Not on file    Inability: Not on file  . Transportation needs:    Medical: Not on file    Non-medical: Not on file  Tobacco Use  . Smoking status: Never Smoker  . Smokeless tobacco: Never Used  Substance and Sexual Activity  . Alcohol use: Yes    Alcohol/week: 1.0 standard drinks    Types: 1 Glasses of wine per week  . Drug use: No  . Sexual activity: Not on file  Lifestyle  . Physical activity:    Days per week: Not on file    Minutes per session: Not on file  . Stress: Not on file  Relationships  . Social connections:    Talks on phone: Not on file    Gets together: Not on file    Attends religious service: Not on file    Active member of club or organization: Not on file    Attends meetings of clubs or organizations: Not on file    Relationship status: Not on file  . Intimate partner violence:    Fear of current or ex partner: Not on file   Emotionally abused: Not on file    Physically abused: Not on file    Forced sexual activity: Not on file  Other Topics Concern  . Not on file  Social History Narrative   Never smoked, 1 child at home        Past Surgical History:  Procedure Laterality Date  . BACK SURGERY  2006   Dr Carloyn Manner, [place]  . cardic catherization  2005   negative; Dr Lyla Son  . colon polyps  2011 & 2014   Dr.Jacobs   . CORNEAL TRANSPLANT Left 1993   Dr Kathrin Penner  . nasal bone surgery  2007  . UPPER GASTROINTESTINAL ENDOSCOPY  2011   mild gastritis; Dr Ardis Hughs    Family History  Problem Relation Age of Onset  . Hypertension Mother   . Diabetes Mother   . Stroke Mother        in 30s  . Diabetes Father   . Hypertension Father   . Heart attack Father        had open heart surgery  . Diabetes Brother  X2  . Heart attack Brother         MI in 64s  . Transient ischemic attack Brother        in 27s  . Kidney failure Brother   . Hypertension Brother   . Diabetes Sister         X 2  . Hypertension Sister   . Kidney failure Brother        renal transplant  . Diabetes Brother   . COPD Neg Hx   . Asthma Neg Hx   . Cancer Neg Hx   . Colon polyps Neg Hx     Allergies  Allergen Reactions  . Aspirin     REACTION: hives States tolerates IBU & acetaminophen OK  . Other     Walnuts & pecans cause hives  . Codeine     Itching with codeine containing cough syrup 9/15  . Zetia [Ezetimibe]     See 09/22/14 musculoskeletal pains  . Ezetimibe-Simvastatin Hives    REACTION: myalgia    Current Medications:   Current Outpatient Medications:  .  Coenzyme Q10 (CO Q-10) 100 MG CAPS, Take 1 capsule by mouth daily., Disp: , Rfl:  .  dexlansoprazole (DEXILANT) 60 MG capsule, Take 1 capsule (60 mg total) by mouth daily., Disp: 90 capsule, Rfl: 3 .  DM-Doxylamine-Acetaminophen (NYQUIL COLD & FLU PO), Take by mouth., Disp: , Rfl:  .  ezetimibe (ZETIA) 10 MG tablet, Take 1 tablet (10 mg total) by mouth  daily., Disp: 90 tablet, Rfl: 3 .  Flaxseed, Linseed, (FLAX SEED OIL PO), Take by mouth., Disp: , Rfl:  .  fluticasone (FLONASE) 50 MCG/ACT nasal spray, PLACE 2 SPRAYS INTO THE NOSE DAILY. AS NEEDED, Disp: 16 g, Rfl: 5 .  hydrocortisone (PROCTOSOL HC) 2.5 % rectal cream, PLACE 1 APPLICATION RECTALLY 2 (TWO) TIMES DAILY., Disp: 28.35 g, Rfl: 1 .  LORazepam (ATIVAN) 0.5 MG tablet, TAKE 1 TABLET BY MOUTH EVERY 8 TO 12 HOURS AS NEEDED, Disp: 30 tablet, Rfl: 0 .  metFORMIN (GLUCOPHAGE-XR) 500 MG 24 hr tablet, Take 1 tablet (500 mg total) by mouth daily with breakfast., Disp: 90 tablet, Rfl: 3 .  Multiple Vitamin (MULTIVITAMIN) tablet, Take 1 tablet by mouth daily., Disp: , Rfl:  .  Omega-3 Fatty Acids (FISH OIL) 500 MG CAPS, Take 1 capsule by mouth daily., Disp: , Rfl:  .  rosuvastatin (CRESTOR) 40 MG tablet, Take 1 tablet (40 mg total) by mouth every other day., Disp: 45 tablet, Rfl: 3 .  rosuvastatin (CRESTOR) 40 MG tablet, TAKE 1 TABLET (40 MG TOTAL) BY MOUTH DAILY., Disp: 90 tablet, Rfl: 3 .  doxycycline (VIBRA-TABS) 100 MG tablet, Take 1 tablet (100 mg total) by mouth 2 (two) times daily., Disp: 20 tablet, Rfl: 0   Review of Systems:   ROS  Negative unless otherwise specified per HPI.   Vitals:   Vitals:   02/17/18 1126  BP: 140/84  Pulse: 94  Temp: 98.6 F (37 C)  TempSrc: Oral  SpO2: 96%  Weight: 142 lb 3.2 oz (64.5 kg)  Height: 5\' 6"  (1.676 m)     Body mass index is 22.95 kg/m.  Physical Exam:   Physical Exam Vitals signs and nursing note reviewed.  Constitutional:      General: He is not in acute distress.    Appearance: He is well-developed. He is not ill-appearing or toxic-appearing.  HENT:     Head: Normocephalic and atraumatic.     Right Ear: Tympanic membrane,  ear canal and external ear normal. Tympanic membrane is not erythematous, retracted or bulging.     Left Ear: Tympanic membrane, ear canal and external ear normal. Tympanic membrane is not erythematous,  retracted or bulging.     Nose: Mucosal edema, congestion and rhinorrhea present.     Right Sinus: No maxillary sinus tenderness or frontal sinus tenderness.     Left Sinus: No maxillary sinus tenderness or frontal sinus tenderness.     Mouth/Throat:     Pharynx: Uvula midline. No posterior oropharyngeal erythema.  Eyes:     General: Lids are normal.     Conjunctiva/sclera: Conjunctivae normal.  Neck:     Trachea: Trachea normal.  Cardiovascular:     Rate and Rhythm: Normal rate and regular rhythm.     Heart sounds: Normal heart sounds, S1 normal and S2 normal.  Pulmonary:     Effort: Pulmonary effort is normal.     Breath sounds: Normal breath sounds. No decreased breath sounds, wheezing, rhonchi or rales.  Lymphadenopathy:     Cervical: No cervical adenopathy.  Skin:    General: Skin is warm and dry.  Neurological:     Mental Status: He is alert.  Psychiatric:        Speech: Speech normal.        Behavior: Behavior normal. Behavior is cooperative.     Assessment and Plan:   Kaedin was seen today for sore throat and cough.  Diagnoses and all orders for this visit:  Upper respiratory tract infection, unspecified type  Other orders -     hydrocortisone (PROCTOSOL HC) 2.5 % rectal cream; PLACE 1 APPLICATION RECTALLY 2 (TWO) TIMES DAILY. -     doxycycline (VIBRA-TABS) 100 MG tablet; Take 1 tablet (100 mg total) by mouth 2 (two) times daily.   No red flags on exam.  Will initiate doxycycline per orders. Discussed taking medications as prescribed. Reviewed return precautions including worsening fever, SOB, worsening cough or other concerns. Push fluids and rest. I recommend that patient follow-up if symptoms worsen or persist despite treatment x 7-10 days, sooner if needed.  . Reviewed expectations re: course of current medical issues. . Discussed self-management of symptoms. . Outlined signs and symptoms indicating need for more acute intervention. . Patient verbalized  understanding and all questions were answered. . See orders for this visit as documented in the electronic medical record. . Patient received an After-Visit Summary.  Inda Coke, PA-C

## 2018-03-19 DIAGNOSIS — Z947 Corneal transplant status: Secondary | ICD-10-CM | POA: Diagnosis not present

## 2018-03-19 DIAGNOSIS — E119 Type 2 diabetes mellitus without complications: Secondary | ICD-10-CM | POA: Diagnosis not present

## 2018-03-19 DIAGNOSIS — H52203 Unspecified astigmatism, bilateral: Secondary | ICD-10-CM | POA: Diagnosis not present

## 2018-03-19 DIAGNOSIS — H43811 Vitreous degeneration, right eye: Secondary | ICD-10-CM | POA: Diagnosis not present

## 2018-03-19 LAB — HM DIABETES EYE EXAM

## 2018-05-25 ENCOUNTER — Encounter: Payer: Self-pay | Admitting: Physician Assistant

## 2018-05-29 ENCOUNTER — Encounter: Payer: Self-pay | Admitting: Internal Medicine

## 2018-05-29 ENCOUNTER — Ambulatory Visit: Payer: BLUE CROSS/BLUE SHIELD | Admitting: Internal Medicine

## 2018-05-29 ENCOUNTER — Ambulatory Visit (INDEPENDENT_AMBULATORY_CARE_PROVIDER_SITE_OTHER): Payer: BLUE CROSS/BLUE SHIELD | Admitting: Internal Medicine

## 2018-05-29 DIAGNOSIS — E782 Mixed hyperlipidemia: Secondary | ICD-10-CM | POA: Diagnosis not present

## 2018-05-29 DIAGNOSIS — Z0000 Encounter for general adult medical examination without abnormal findings: Secondary | ICD-10-CM

## 2018-05-29 DIAGNOSIS — Z860100 Personal history of colon polyps, unspecified: Secondary | ICD-10-CM

## 2018-05-29 DIAGNOSIS — R7303 Prediabetes: Secondary | ICD-10-CM | POA: Diagnosis not present

## 2018-05-29 MED ORDER — METFORMIN HCL ER 500 MG PO TB24: 500.0000 mg | ORAL_TABLET | Freq: Every day | ORAL | 3 refills | Status: DC

## 2018-05-29 MED ORDER — DEXLANSOPRAZOLE 60 MG PO CPDR: 1.0000 | DELAYED_RELEASE_CAPSULE | Freq: Every day | ORAL | 3 refills | Status: DC

## 2018-05-29 MED ORDER — LORAZEPAM 0.5 MG PO TABS: ORAL_TABLET | ORAL | 2 refills | Status: DC

## 2018-05-29 MED ORDER — ROSUVASTATIN CALCIUM 40 MG PO TABS: 40.0000 mg | ORAL_TABLET | ORAL | 3 refills | Status: DC

## 2018-05-29 NOTE — Assessment & Plan Note (Signed)

## 2018-05-29 NOTE — Assessment & Plan Note (Signed)
Declines colonscopy scheduling for now due to pandemic

## 2018-05-29 NOTE — Progress Notes (Signed)
Patient ID: Kevin Farley, male   DOB: [birth year], 63 y.o.   MRN: [id number]  Virtual Visit via Video Note  I connected with Kevin Farley on 05/29/18 at  8:40 AM EDT by a video enabled telemedicine application and verified that I am speaking with the correct person using two identifiers.Pt is at home, and I am in office, and wife is also present (and children who for some reason keep hitting the computer with the toys at the end visit)    I discussed the limitations of evaluation and management by telemedicine and the availability of in person appointments. The patient expressed understanding and agreed to proceed.  History of Present Illness: Here for wellness and f/u;  Overall doing ok;  Pt denies Chest pain, worsening SOB, DOE, wheezing, orthopnea, PND, worsening LE edema, palpitations, dizziness or syncope.  Pt denies neurological change such as new headache, facial or extremity weakness.  Pt denies polydipsia, polyuria, or low sugar symptoms. Pt states overall good compliance with treatment and medications, good tolerability, and has been trying to follow appropriate diet.  Pt denies worsening depressive symptoms, suicidal ideation or panic. No fever, night sweats, wt loss, loss of appetite, or other constitutional symptoms.  Pt states good ability with ADL's, has low fall risk, home safety reviewed and adequate, no other significant changes in hearing or vision, and only occasionally active with exercise, really very much reduced now during the pandemic, not really even going outside the home.  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  WIfe is health care provider who took his VS today (see below).  CBG was 116 Past Medical History:  Diagnosis Date  . Allergic rhinitis   . Colon polyps    adenomatous 2011 & 2014  . Family history of ischemic heart disease   . GERD (gastroesophageal reflux disease)   . Hyperlipidemia   . Other abnormal glucose    A1c 6%  in 06/2009  . Supraspinatus tendon tear    Past Surgical History:  Procedure Laterality Date  . BACK SURGERY  2006   Dr Carloyn Manner, [place]  . cardic catherization  2005   negative; Dr Lyla Son  . colon polyps  2011 & 2014   Dr.Jacobs   . CORNEAL TRANSPLANT Left 1993   Dr Kathrin Penner  . nasal bone surgery  2007  . UPPER GASTROINTESTINAL ENDOSCOPY  2011   mild gastritis; Dr Ardis Hughs    reports that he has never smoked. He has never used smokeless tobacco. He reports current alcohol use of about 1.0 standard drinks of alcohol per week. He reports that he does not use drugs. family history includes Diabetes in his brother, brother, father, mother, and sister; Heart attack in his brother and father; Hypertension in his brother, father, mother, and sister; Kidney failure in his brother and brother; Stroke in his mother; Transient ischemic attack in his brother. Allergies  Allergen Reactions  . Aspirin     REACTION: hives States tolerates IBU & acetaminophen OK  . Other     Walnuts & pecans cause hives  . Codeine     Itching with codeine containing cough syrup 9/15  . Zetia [Ezetimibe]     See 09/22/14 musculoskeletal pains  . Ezetimibe-Simvastatin Hives    REACTION: myalgia   Current Outpatient Medications on File Prior to Visit  Medication Sig Dispense Refill  . Coenzyme Q10 (CO Q-10) 100 MG CAPS Take 1 capsule by mouth daily.    Marland Kitchen  dexlansoprazole (DEXILANT) 60 MG capsule Take 1 capsule (60 mg total) by mouth daily. 90 capsule 3  . DM-Doxylamine-Acetaminophen (NYQUIL COLD & FLU PO) Take by mouth.    . doxycycline (VIBRA-TABS) 100 MG tablet Take 1 tablet (100 mg total) by mouth 2 (two) times daily. 20 tablet 0  . ezetimibe (ZETIA) 10 MG tablet Take 1 tablet (10 mg total) by mouth daily. 90 tablet 3  . Flaxseed, Linseed, (FLAX SEED OIL PO) Take by mouth.    . fluticasone (FLONASE) 50 MCG/ACT nasal spray PLACE 2 SPRAYS INTO THE NOSE DAILY. AS NEEDED 16 g 5  . hydrocortisone (PROCTOSOL HC) 2.5 %  rectal cream PLACE 1 APPLICATION RECTALLY 2 (TWO) TIMES DAILY. 28.35 g 1  . LORazepam (ATIVAN) 0.5 MG tablet TAKE 1 TABLET BY MOUTH EVERY 8 TO 12 HOURS AS NEEDED 30 tablet 0  . metFORMIN (GLUCOPHAGE-XR) 500 MG 24 hr tablet Take 1 tablet (500 mg total) by mouth daily with breakfast. 90 tablet 3  . Multiple Vitamin (MULTIVITAMIN) tablet Take 1 tablet by mouth daily.    . Omega-3 Fatty Acids (FISH OIL) 500 MG CAPS Take 1 capsule by mouth daily.    . rosuvastatin (CRESTOR) 40 MG tablet Take 1 tablet (40 mg total) by mouth every other day. 45 tablet 3  . rosuvastatin (CRESTOR) 40 MG tablet TAKE 1 TABLET (40 MG TOTAL) BY MOUTH DAILY. 90 tablet 3  . [DISCONTINUED] Lansoprazole (PREVACID PO) Take by mouth daily.      . [DISCONTINUED] omeprazole (PRILOSEC) 40 MG capsule Take 1 capsule (40 mg total) by mouth daily. 180 capsule 1   No current facility-administered medications on file prior to visit.    Observations/Objective: Alert, mentating well, normal mood and affect just unhappy with being stuck in the house by the pandemic, resps normal, cn 2-12 intact, moves all 4s; BP 131/83, wt 136.6, temp 98.7, HR 73., o2 sat 98% Lab Results  Component Value Date   WBC 7.3 05/20/2017   HGB 13.7 05/20/2017   HCT 40.7 05/20/2017   PLT 176.0 05/20/2017   GLUCOSE 113 (H) 12/16/2017   CHOL 242 (H) 12/16/2017   TRIG 246.0 (H) 12/16/2017   HDL 65.20 12/16/2017   LDLDIRECT 138.0 12/16/2017   LDLCALC 142 (H) 11/18/2016   ALT 30 12/16/2017   AST 22 12/16/2017   NA 141 12/16/2017   K 4.2 12/16/2017   CL 104 12/16/2017   CREATININE 1.02 12/16/2017   BUN 15 12/16/2017   CO2 30 12/16/2017   TSH 2.76 05/20/2017   PSA 0.96 05/20/2017   HGBA1C 6.0 12/16/2017   Assessment and Plan: See notes  Follow Up Instructions: See notes   I discussed the assessment and treatment plan with the patient. The patient was provided an opportunity to ask questions and all were answered. The patient agreed with the plan and  demonstrated an understanding of the instructions.   The patient was advised to call back or seek an in-person evaluation if the symptoms worsen or if the condition fails to improve as anticipated.  Cathlean Cower, MD

## 2018-05-29 NOTE — Patient Instructions (Signed)
Please continue all other medications as before, and refills have been done if requested.  Please have the pharmacy call with any other refills you may need.  Please continue your efforts at being more active, low cholesterol diet, and weight control.  You are otherwise up to date with prevention measures today.  Please keep your appointments with your specialists as you may have planned  Please go to the LAB in the Basement (turn left off the elevator) for the tests to be done at your convenience  You will be contacted by phone if any changes need to be made immediately.  Otherwise, you will receive a letter about your results with an explanation, but please check with MyChart first.  Please remember to sign up for MyChart if you have not done so, as this will be important to you in the future with finding out test results, communicating by private email, and scheduling acute appointments online when needed.  Please return in 6 months, or sooner if needed, with Lab testing done 3-5 days before  

## 2018-05-29 NOTE — Assessment & Plan Note (Signed)
stable overall by history and exam, recent data reviewed with pt, and pt to continue medical treatment as before,  to f/u any worsening symptoms or concerns, for a1c with lab

## 2018-05-29 NOTE — Assessment & Plan Note (Signed)
Tolerating crestor 40 qod, cont lower chol diet,  to f/u any worsening symptoms or concerns, for lipids with labs, goal ldl < 100

## 2018-07-18 ENCOUNTER — Other Ambulatory Visit: Payer: Self-pay | Admitting: Internal Medicine

## 2018-08-02 ENCOUNTER — Other Ambulatory Visit: Payer: Self-pay | Admitting: Internal Medicine

## 2018-09-05 DIAGNOSIS — Z20828 Contact with and (suspected) exposure to other viral communicable diseases: Secondary | ICD-10-CM | POA: Diagnosis not present

## 2018-09-12 ENCOUNTER — Encounter: Payer: Self-pay | Admitting: Internal Medicine

## 2018-09-14 MED ORDER — TRIAMCINOLONE ACETONIDE 0.1 % EX CREA: 1.0000 "application " | TOPICAL_CREAM | Freq: Two times a day (BID) | CUTANEOUS | 0 refills | Status: DC

## 2018-09-14 MED ORDER — PREDNISONE 10 MG PO TABS: ORAL_TABLET | ORAL | 0 refills | Status: DC

## 2018-09-21 ENCOUNTER — Telehealth: Payer: Self-pay

## 2018-09-21 ENCOUNTER — Encounter: Payer: Self-pay | Admitting: Internal Medicine

## 2018-09-21 ENCOUNTER — Other Ambulatory Visit: Payer: Self-pay

## 2018-09-21 ENCOUNTER — Ambulatory Visit (INDEPENDENT_AMBULATORY_CARE_PROVIDER_SITE_OTHER): Payer: BLUE CROSS/BLUE SHIELD | Admitting: Internal Medicine

## 2018-09-21 DIAGNOSIS — R7303 Prediabetes: Secondary | ICD-10-CM | POA: Diagnosis not present

## 2018-09-21 DIAGNOSIS — Z20822 Contact with and (suspected) exposure to covid-19: Secondary | ICD-10-CM

## 2018-09-21 DIAGNOSIS — J069 Acute upper respiratory infection, unspecified: Secondary | ICD-10-CM | POA: Insufficient documentation

## 2018-09-21 DIAGNOSIS — J309 Allergic rhinitis, unspecified: Secondary | ICD-10-CM | POA: Diagnosis not present

## 2018-09-21 MED ORDER — AZITHROMYCIN 250 MG PO TABS: ORAL_TABLET | ORAL | 1 refills | Status: DC

## 2018-09-21 NOTE — Patient Instructions (Signed)
Please take all new medication as prescribed - the antibiotic  Please continue all other medications as before, and refills have been done if requested.  Please have the pharmacy call with any other refills you may need.  Please continue your efforts at being more active, low cholesterol diet, and weight control.  Please keep your appointments with your specialists as you may have planned  Please go to the [facility] site for COVID testing

## 2018-09-21 NOTE — Assessment & Plan Note (Signed)
Mild to mod, for antibx course,  to f/u any worsening symptoms or concerns, also refer for COVID testing

## 2018-09-21 NOTE — Assessment & Plan Note (Signed)
Recurring despite the prednisone, for otc allegra prn

## 2018-09-21 NOTE — Progress Notes (Deleted)
   Subjective:    Patient ID: Kevin Farley, male    DOB: 08/19/1955, 63 y.o.   MRN: [id number]  HPI    Review of Systems     Objective:   Physical Exam        Assessment & Plan:

## 2018-09-21 NOTE — Assessment & Plan Note (Signed)
stable overall by history and exam, recent data reviewed with pt, and pt to continue medical treatment as before,  to f/u any worsening symptoms or concerns  

## 2018-09-21 NOTE — Telephone Encounter (Signed)
Dx given   Copied from [place] [id number]. Topic: General - Other >> Sep 21, 2018 10:58 AM Leward Quan A wrote: Reason for CRM: Summer with the [facility] called to say in order for the RX for azithromycin (ZITHROMAX Z-PAK) 250 MG tablet they will need to know what the diagnosis is. Please call pharmacy with Dx so patient can receive his medication.

## 2018-09-21 NOTE — Progress Notes (Signed)
Patient ID: Kevin Farley, male   DOB: 1955/11/15, 63 y.o.   MRN: [id number]  Virtual Visit via Video Note  I connected with Kevin Farley on 09/21/18 at  9:40 AM EDT by a video enabled telemedicine application and verified that I am speaking with the correct person using two identifiers.  Location: Patient: at home Provider: at office   I discussed the limitations of evaluation and management by telemedicine and the availability of in person appointments. The patient expressed understanding and agreed to proceed.  History of Present Illness: Here with 2-3 days acute onset fever, facial pain, pressure, headache, general weakness and malaise, with mild ST and cough, but pt denies chest pain, wheezing, increased sob or doe, orthopnea, PND, increased LE swelling, palpitations, dizziness or syncope. Does have several wks ongoing nasal allergy symptoms with clearish congestion, itch and sneezing, without fever, pain, ST, cough, swelling or wheezing.   Pt denies polydipsia, polyuria Past Medical History:  Diagnosis Date  . Allergic rhinitis   . Colon polyps    adenomatous 2011 & 2014  . Family history of ischemic heart disease   . GERD (gastroesophageal reflux disease)   . Hyperlipidemia   . Other abnormal glucose    A1c 6% in 06/2009  . Supraspinatus tendon tear    Past Surgical History:  Procedure Laterality Date  . BACK SURGERY  2006   Dr Carloyn Manner, [place]  . cardic catherization  2005   negative; Dr Lyla Son  . colon polyps  2011 & 2014   Dr.Jacobs   . CORNEAL TRANSPLANT Left 1993   Dr Kathrin Penner  . nasal bone surgery  2007  . UPPER GASTROINTESTINAL ENDOSCOPY  2011   mild gastritis; Dr Ardis Hughs    reports that he has never smoked. He has never used smokeless tobacco. He reports current alcohol use of about 1.0 standard drinks of alcohol per week. He reports that he does not use drugs. family history includes Diabetes in his brother, brother, father, mother, and sister; Heart attack in his brother  and father; Hypertension in his brother, father, mother, and sister; Kidney failure in his brother and brother; Stroke in his mother; Transient ischemic attack in his brother. Allergies  Allergen Reactions  . Aspirin     REACTION: hives States tolerates IBU & acetaminophen OK  . Other     Walnuts & pecans cause hives  . Codeine     Itching with codeine containing cough syrup 9/15  . Zetia [Ezetimibe]     See 09/22/14 musculoskeletal pains  . Ezetimibe-Simvastatin Hives    REACTION: myalgia   Current Outpatient Medications on File Prior to Visit  Medication Sig Dispense Refill  . Coenzyme Q10 (CO Q-10) 100 MG CAPS Take 1 capsule by mouth daily.    Marland Kitchen dexlansoprazole (DEXILANT) 60 MG capsule Take 1 capsule (60 mg total) by mouth daily. 90 capsule 3  . DM-Doxylamine-Acetaminophen (NYQUIL COLD & FLU PO) Take by mouth.    . Flaxseed, Linseed, (FLAX SEED OIL PO) Take by mouth.    . fluticasone (FLONASE) 50 MCG/ACT nasal spray PLACE 2 SPRAYS INTO THE NOSE DAILY AS NEEDED 48 mL 1  . hydrocortisone (PROCTOSOL HC) 2.5 % rectal cream PLACE 1 APPLICATION RECTALLY 2 (TWO) TIMES DAILY. 28.35 g 1  . LORazepam (ATIVAN) 0.5 MG tablet TAKE 1 TABLET BY MOUTH EVERY 8 TO 12 HOURS AS NEEDED 30 tablet 2  . metFORMIN (GLUCOPHAGE-XR) 500 MG 24 hr tablet Take 1 tablet (500 mg total) by mouth daily  with breakfast. 90 tablet 3  . Multiple Vitamin (MULTIVITAMIN) tablet Take 1 tablet by mouth daily.    . Omega-3 Fatty Acids (FISH OIL) 500 MG CAPS Take 1 capsule by mouth daily.    . predniSONE (DELTASONE) 10 MG tablet 2 tabs by mouth per day for 5 days 10 tablet 0  . rosuvastatin (CRESTOR) 40 MG tablet Take 1 tablet (40 mg total) by mouth every other day. 45 tablet 3  . triamcinolone cream (KENALOG) 0.1 % Apply 1 application topically 2 (two) times daily. 30 g 0  . [DISCONTINUED] Lansoprazole (PREVACID PO) Take by mouth daily.      . [DISCONTINUED] omeprazole (PRILOSEC) 40 MG capsule Take 1 capsule (40 mg total) by  mouth daily. 180 capsule 1   No current facility-administered medications on file prior to visit.     Observations/Objective: Alert, NAD, mild ill, appropriate mood and affect, resps normal, cn 2-12 intact, moves all 4s, no visible rash or swelling Lab Results  Component Value Date   WBC 7.3 05/20/2017   HGB 13.7 05/20/2017   HCT 40.7 05/20/2017   PLT 176.0 05/20/2017   GLUCOSE 113 (H) 12/16/2017   CHOL 242 (H) 12/16/2017   TRIG 246.0 (H) 12/16/2017   HDL 65.20 12/16/2017   LDLDIRECT 138.0 12/16/2017   LDLCALC 142 (H) 11/18/2016   ALT 30 12/16/2017   AST 22 12/16/2017   NA 141 12/16/2017   K 4.2 12/16/2017   CL 104 12/16/2017   CREATININE 1.02 12/16/2017   BUN 15 12/16/2017   CO2 30 12/16/2017   TSH 2.76 05/20/2017   PSA 0.96 05/20/2017   HGBA1C 6.0 12/16/2017   Assessment and Plan: See notes  Follow Up Instructions: See notes   I discussed the assessment and treatment plan with the patient. The patient was provided an opportunity to ask questions and all were answered. The patient agreed with the plan and demonstrated an understanding of the instructions.   The patient was advised to call back or seek an in-person evaluation if the symptoms worsen or if the condition fails to improve as anticipated   Cathlean Cower, MD

## 2018-09-22 LAB — NOVEL CORONAVIRUS, NAA: SARS-CoV-2, NAA: NOT DETECTED

## 2018-09-23 ENCOUNTER — Telehealth: Payer: Self-pay

## 2018-09-23 NOTE — Telephone Encounter (Signed)
-----   Message from Biagio Borg, MD sent at 09/22/2018  5:01 PM EDT ----- Left message on MyChart, pt to cont same tx   The test results show that your current treatment is OK, as the COVID is negative     to please inform pt

## 2018-09-23 NOTE — Telephone Encounter (Signed)
Pt has been informed of results and expressed understanding.  °

## 2018-09-29 ENCOUNTER — Ambulatory Visit: Payer: Self-pay | Admitting: *Deleted

## 2018-09-29 NOTE — Telephone Encounter (Signed)
Pt should not need further antibiotic unless worsening fever, chills, pain or shortness of breath.  Some cough and congestion is normal for up to several wks after an infection, especially viral infections, due to the resolving inflammation involved.

## 2018-09-29 NOTE — Telephone Encounter (Signed)
   Reason for Disposition . Cough with cold symptoms (e.g., runny nose, postnasal drip, throat clearing)    Forwarding note to PCP due to patient's request for additional medication.  Answer Assessment - Initial Assessment Questions 1. ONSET: "When did the cough begin?"      1 week 2. SEVERITY: "How bad is the cough today?"      Moderate  3. RESPIRATORY DISTRESS: "Describe your breathing."     Denies shortness of breath  4. FEVER: "Do you have a fever?" If so, ask: "What is your temperature, how was it measured, and when did it start?"     Denies fever  5. SPUTUM: "Describe the color of your sputum" (clear, white, yellow, green)     Yellow/green sputum and nasal drainage  6. HEMOPTYSIS: "Are you coughing up any blood?" If so ask: "How much?" (flecks, streaks, tablespoons, etc.)     Denies hemoptysis  7. CARDIAC HISTORY: "Do you have any history of heart disease?" (e.g., heart attack, congestive heart failure)      Denies cardiac history 8. LUNG HISTORY: "Do you have any history of lung disease?"  (e.g., pulmonary embolus, asthma, emphysema)     Denies Lung disease  9. PE RISK FACTORS: "Do you have a history of blood clots?" (or: recent major surgery, recent prolonged travel, bedridden) Denies      10. OTHER SYMPTOMS: "Do you have any other symptoms?" (e.g., runny nose, wheezing, chest pain)      Runny nose, congestion 11. PREGNANCY: "Is there any chance you are pregnant?" "When was your last menstrual period?" N/A       12. TRAVEL: "Have you traveled out of the country in the last month?" (e.g., travel history, exposures)       No travel  Protocols used: [place] with patient's wife Judeen Hammans per [employer].  She states her husband has improved, but productive cough and nasal drainage- yellow/green in color persists.  States cough is still waking him up at night.  He has tried OTC cough medication Robitussin DM.   He finished z-pak 09/25/2018.  She is requesting a  call back regarding a different antibiotic from Dr. Gwynn Burly office.  Will forward to Dr. Gwynn Burly office to address request for additional medication.  Instructions for continued home care reviewed.

## 2018-09-29 NOTE — Telephone Encounter (Signed)
Message from Rayann Heman sent at 09/29/2018 1:36 PM EDT  Summary: phlegm and cough    Pt wife called and stated that pt is having dome phlegm and a cough. Pt wife states that he has tested negative for covid and would like to know what they should do. Please advise

## 2018-09-30 NOTE — Telephone Encounter (Signed)
Called pt, LVM.   

## 2018-10-01 ENCOUNTER — Ambulatory Visit (INDEPENDENT_AMBULATORY_CARE_PROVIDER_SITE_OTHER): Payer: BLUE CROSS/BLUE SHIELD | Admitting: Internal Medicine

## 2018-10-01 ENCOUNTER — Encounter: Payer: Self-pay | Admitting: Internal Medicine

## 2018-10-01 DIAGNOSIS — R05 Cough: Secondary | ICD-10-CM

## 2018-10-01 DIAGNOSIS — E559 Vitamin D deficiency, unspecified: Secondary | ICD-10-CM

## 2018-10-01 DIAGNOSIS — E611 Iron deficiency: Secondary | ICD-10-CM

## 2018-10-01 DIAGNOSIS — R7303 Prediabetes: Secondary | ICD-10-CM | POA: Diagnosis not present

## 2018-10-01 DIAGNOSIS — K649 Unspecified hemorrhoids: Secondary | ICD-10-CM | POA: Diagnosis not present

## 2018-10-01 DIAGNOSIS — J309 Allergic rhinitis, unspecified: Secondary | ICD-10-CM | POA: Diagnosis not present

## 2018-10-01 DIAGNOSIS — R059 Cough, unspecified: Secondary | ICD-10-CM

## 2018-10-01 DIAGNOSIS — E538 Deficiency of other specified B group vitamins: Secondary | ICD-10-CM

## 2018-10-01 DIAGNOSIS — Z0000 Encounter for general adult medical examination without abnormal findings: Secondary | ICD-10-CM

## 2018-10-01 MED ORDER — HYDROCORTISONE 0.5 % EX CREA: 1.0000 "application " | TOPICAL_CREAM | Freq: Two times a day (BID) | CUTANEOUS | 2 refills | Status: DC

## 2018-10-01 MED ORDER — HYDROCODONE-HOMATROPINE 5-1.5 MG/5ML PO SYRP: 5.0000 mL | ORAL_SOLUTION | Freq: Four times a day (QID) | ORAL | 0 refills | Status: AC | PRN

## 2018-10-01 MED ORDER — DOXYCYCLINE HYCLATE 100 MG PO TABS: 100.0000 mg | ORAL_TABLET | Freq: Two times a day (BID) | ORAL | 0 refills | Status: DC

## 2018-10-01 MED ORDER — FLUTICASONE PROPIONATE 50 MCG/ACT NA SUSP: NASAL | 3 refills | Status: DC

## 2018-10-01 NOTE — Patient Instructions (Signed)
Please take all new medication as prescribed  Please continue all other medications as before, and refills have been done if requested.  Please have the pharmacy call with any other refills you may need.  Please continue your efforts at being more active, low cholesterol diet, and weight control.  Please keep your appointments with your specialists as you may have planned    

## 2018-10-01 NOTE — Assessment & Plan Note (Signed)
Mild to mod, c/w bronchitis vs pna, for antibx course, cough med, declines cxr,  to f/u any worsening symptoms or concerns

## 2018-10-01 NOTE — Assessment & Plan Note (Signed)
stable overall by history and exam, recent data reviewed with pt, and pt to continue medical treatment as before,  to f/u any worsening symptoms or concerns  

## 2018-10-01 NOTE — Assessment & Plan Note (Signed)
[place] for steroid cream ,  to f/u any worsening symptoms or concerns

## 2018-10-01 NOTE — Assessment & Plan Note (Signed)
Mild to mod, for add flonase,  to f/u any worsening symptoms or concerns

## 2018-10-01 NOTE — Progress Notes (Signed)
Patient ID: Kevin Farley, male   DOB: 09-12-1955, 63 y.o.   MRN: [id number]  Virtual Visit via Video Note  I connected with Kevin Farley on 10/01/18 at  2:40 PM EDT by a video enabled telemedicine application and verified that I am speaking with the correct person using two identifiers.  Location: Patient: at home Provider: at office   I discussed the limitations of evaluation and management by telemedicine and the availability of in person appointments. The patient expressed understanding and agreed to proceed.  History of Present Illness: Here to f/u recent visit, initially better but now worse again ; Here with acute onset mild to mod 2-3 days ST, HA, general weakness and malaise, with prod cough greenish sputum, but Pt denies chest pain, increased sob or doe, wheezing, orthopnea, PND, increased LE swelling, palpitations, dizziness or syncope.  Does have several wks ongoing nasal allergy symptoms with clearish congestion, itch and sneezing, without fever, pain, ST, cough, swelling or wheezing.  Also asks for new steroid cream for recurring hemorrhoid as the old cream is no longer available.  Denies worsening reflux, abd pain, dysphagia, n/v, bowel change or blood. Past Medical History:  Diagnosis Date  . Allergic rhinitis   . Colon polyps    adenomatous 2011 & 2014  . Family history of ischemic heart disease   . GERD (gastroesophageal reflux disease)   . Hyperlipidemia   . Other abnormal glucose    A1c 6% in 06/2009  . Supraspinatus tendon tear    Past Surgical History:  Procedure Laterality Date  . BACK SURGERY  2006   Dr Carloyn Manner, [place]  . cardic catherization  2005   negative; Dr Lyla Son  . colon polyps  2011 & 2014   Dr.Jacobs   . CORNEAL TRANSPLANT Left 1993   Dr Kathrin Penner  . nasal bone surgery  2007  . UPPER GASTROINTESTINAL ENDOSCOPY  2011   mild gastritis; Dr Ardis Hughs    reports that he has never smoked. He has never used smokeless tobacco. He reports current alcohol use of about  1.0 standard drinks of alcohol per week. He reports that he does not use drugs. family history includes Diabetes in his brother, brother, father, mother, and sister; Heart attack in his brother and father; Hypertension in his brother, father, mother, and sister; Kidney failure in his brother and brother; Stroke in his mother; Transient ischemic attack in his brother. Allergies  Allergen Reactions  . Aspirin     REACTION: hives States tolerates IBU & acetaminophen OK  . Other     Walnuts & pecans cause hives  . Codeine     Itching with codeine containing cough syrup 9/15  . Zetia [Ezetimibe]     See 09/22/14 musculoskeletal pains  . Ezetimibe-Simvastatin Hives    REACTION: myalgia   Current Outpatient Medications on File Prior to Visit  Medication Sig Dispense Refill  . azithromycin (ZITHROMAX Z-PAK) 250 MG tablet 2 tab by mouth day 1, then 1 per day 6 tablet 1  . Coenzyme Q10 (CO Q-10) 100 MG CAPS Take 1 capsule by mouth daily.    Marland Kitchen dexlansoprazole (DEXILANT) 60 MG capsule Take 1 capsule (60 mg total) by mouth daily. 90 capsule 3  . DM-Doxylamine-Acetaminophen (NYQUIL COLD & FLU PO) Take by mouth.    . Flaxseed, Linseed, (FLAX SEED OIL PO) Take by mouth.    . hydrocortisone (PROCTOSOL HC) 2.5 % rectal cream PLACE 1 APPLICATION RECTALLY 2 (TWO) TIMES DAILY. 28.35 g 1  .  LORazepam (ATIVAN) 0.5 MG tablet TAKE 1 TABLET BY MOUTH EVERY 8 TO 12 HOURS AS NEEDED 30 tablet 2  . metFORMIN (GLUCOPHAGE-XR) 500 MG 24 hr tablet Take 1 tablet (500 mg total) by mouth daily with breakfast. 90 tablet 3  . Multiple Vitamin (MULTIVITAMIN) tablet Take 1 tablet by mouth daily.    . Omega-3 Fatty Acids (FISH OIL) 500 MG CAPS Take 1 capsule by mouth daily.    . rosuvastatin (CRESTOR) 40 MG tablet Take 1 tablet (40 mg total) by mouth every other day. 45 tablet 3  . triamcinolone cream (KENALOG) 0.1 % Apply 1 application topically 2 (two) times daily. 30 g 0  . [DISCONTINUED] Lansoprazole (PREVACID PO) Take by  mouth daily.      . [DISCONTINUED] omeprazole (PRILOSEC) 40 MG capsule Take 1 capsule (40 mg total) by mouth daily. 180 capsule 1   No current facility-administered medications on file prior to visit.     Observations/Objective: Alert, NAD, appropriate mood and affect, resps normal, cn 2-12 intact, moves all 4s, no visible rash or swelling Lab Results  Component Value Date   WBC 7.3 05/20/2017   HGB 13.7 05/20/2017   HCT 40.7 05/20/2017   PLT 176.0 05/20/2017   GLUCOSE 113 (H) 12/16/2017   CHOL 242 (H) 12/16/2017   TRIG 246.0 (H) 12/16/2017   HDL 65.20 12/16/2017   LDLDIRECT 138.0 12/16/2017   LDLCALC 142 (H) 11/18/2016   ALT 30 12/16/2017   AST 22 12/16/2017   NA 141 12/16/2017   K 4.2 12/16/2017   CL 104 12/16/2017   CREATININE 1.02 12/16/2017   BUN 15 12/16/2017   CO2 30 12/16/2017   TSH 2.76 05/20/2017   PSA 0.96 05/20/2017   HGBA1C 6.0 12/16/2017   Assessment and Plan: See notes  Follow Up Instructions: See notes   I discussed the assessment and treatment plan with the patient. The patient was provided an opportunity to ask questions and all were answered. The patient agreed with the plan and demonstrated an understanding of the instructions.   The patient was advised to call back or seek an in-person evaluation if the symptoms worsen or if the condition fails to improve as anticipated.   Cathlean Cower, MD

## 2018-10-22 ENCOUNTER — Telehealth: Payer: Self-pay | Admitting: Internal Medicine

## 2018-10-22 DIAGNOSIS — Z0000 Encounter for general adult medical examination without abnormal findings: Secondary | ICD-10-CM

## 2018-10-22 DIAGNOSIS — Z23 Encounter for immunization: Secondary | ICD-10-CM

## 2018-10-22 NOTE — Telephone Encounter (Signed)
Pt is scheduled for cpe on 10/30/18 and would like to have lab orders placed so that he can complete before apt.    Please assist and advise pt of when orders are in.   Thanks.

## 2018-10-30 ENCOUNTER — Ambulatory Visit (INDEPENDENT_AMBULATORY_CARE_PROVIDER_SITE_OTHER): Payer: BC Managed Care – PPO | Admitting: Internal Medicine

## 2018-10-30 ENCOUNTER — Other Ambulatory Visit (INDEPENDENT_AMBULATORY_CARE_PROVIDER_SITE_OTHER): Payer: BC Managed Care – PPO

## 2018-10-30 ENCOUNTER — Encounter: Payer: Self-pay | Admitting: Internal Medicine

## 2018-10-30 ENCOUNTER — Other Ambulatory Visit: Payer: Self-pay

## 2018-10-30 VITALS — BP 126/82 | HR 78 | Ht 66.0 in | Wt 138.0 lb

## 2018-10-30 DIAGNOSIS — Z0000 Encounter for general adult medical examination without abnormal findings: Secondary | ICD-10-CM

## 2018-10-30 DIAGNOSIS — K649 Unspecified hemorrhoids: Secondary | ICD-10-CM

## 2018-10-30 DIAGNOSIS — E782 Mixed hyperlipidemia: Secondary | ICD-10-CM | POA: Diagnosis not present

## 2018-10-30 DIAGNOSIS — E538 Deficiency of other specified B group vitamins: Secondary | ICD-10-CM | POA: Diagnosis not present

## 2018-10-30 DIAGNOSIS — N320 Bladder-neck obstruction: Secondary | ICD-10-CM | POA: Diagnosis not present

## 2018-10-30 DIAGNOSIS — R7303 Prediabetes: Secondary | ICD-10-CM | POA: Diagnosis not present

## 2018-10-30 DIAGNOSIS — Z23 Encounter for immunization: Secondary | ICD-10-CM

## 2018-10-30 DIAGNOSIS — K219 Gastro-esophageal reflux disease without esophagitis: Secondary | ICD-10-CM

## 2018-10-30 DIAGNOSIS — E559 Vitamin D deficiency, unspecified: Secondary | ICD-10-CM | POA: Diagnosis not present

## 2018-10-30 DIAGNOSIS — R21 Rash and other nonspecific skin eruption: Secondary | ICD-10-CM | POA: Insufficient documentation

## 2018-10-30 DIAGNOSIS — E611 Iron deficiency: Secondary | ICD-10-CM

## 2018-10-30 LAB — CBC WITH DIFFERENTIAL/PLATELET
Basophils Absolute: 0 10*3/uL (ref 0.0–0.1)
Basophils Relative: 0.6 % (ref 0.0–3.0)
Eosinophils Absolute: 0.1 10*3/uL (ref 0.0–0.7)
Eosinophils Relative: 1.6 % (ref 0.0–5.0)
HCT: 40.5 % (ref 39.0–52.0)
Hemoglobin: 13.5 g/dL (ref 13.0–17.0)
Lymphocytes Relative: 26.3 % (ref 12.0–46.0)
Lymphs Abs: 1.4 10*3/uL (ref 0.7–4.0)
MCHC: 33.5 g/dL (ref 30.0–36.0)
MCV: 84.8 fl (ref 78.0–100.0)
Monocytes Absolute: 0.5 10*3/uL (ref 0.1–1.0)
Monocytes Relative: 9.5 % (ref 3.0–12.0)
Neutro Abs: 3.4 10*3/uL (ref 1.4–7.7)
Neutrophils Relative %: 62 % (ref 43.0–77.0)
Platelets: 180 10*3/uL (ref 150.0–400.0)
RBC: 4.77 Mil/uL (ref 4.22–5.81)
RDW: 13.4 % (ref 11.5–15.5)
WBC: 5.5 10*3/uL (ref 4.0–10.5)

## 2018-10-30 LAB — BASIC METABOLIC PANEL
BUN: 20 mg/dL (ref 6–23)
CO2: 29 mEq/L (ref 19–32)
Calcium: 9.7 mg/dL (ref 8.4–10.5)
Chloride: 103 mEq/L (ref 96–112)
Creatinine, Ser: 0.99 mg/dL (ref 0.40–1.50)
GFR: 76.63 mL/min (ref >60.00)
Glucose, Bld: 92 mg/dL (ref 70–99)
Potassium: 4.1 mEq/L (ref 3.5–5.1)
Sodium: 140 mEq/L (ref 135–145)

## 2018-10-30 LAB — PSA: PSA: 1.27 ng/mL (ref 0.10–4.00)

## 2018-10-30 LAB — URINALYSIS, ROUTINE W REFLEX MICROSCOPIC
Bilirubin Urine: NEGATIVE
Hgb urine dipstick: NEGATIVE
Ketones, ur: NEGATIVE
Nitrite: NEGATIVE
RBC / HPF: NONE SEEN (ref 0–?)
Specific Gravity, Urine: 1.01 (ref 1.000–1.030)
Total Protein, Urine: NEGATIVE
Urine Glucose: NEGATIVE
Urobilinogen, UA: 0.2 (ref 0.0–1.0)
pH: 7 (ref 5.0–8.0)

## 2018-10-30 LAB — HEPATIC FUNCTION PANEL
ALT: 23 U/L (ref 0–53)
AST: 16 U/L (ref 0–37)
Albumin: 4.5 g/dL (ref 3.5–5.2)
Alkaline Phosphatase: 53 U/L (ref 39–117)
Bilirubin, Direct: 0.1 mg/dL (ref 0.0–0.3)
Total Bilirubin: 0.3 mg/dL (ref 0.2–1.2)
Total Protein: 7.2 g/dL (ref 6.0–8.3)

## 2018-10-30 LAB — VITAMIN B12: Vitamin B-12: 821 pg/mL (ref 211–911)

## 2018-10-30 LAB — VITAMIN D 25 HYDROXY (VIT D DEFICIENCY, FRACTURES): VITD: 31.48 ng/mL (ref 30.00–100.00)

## 2018-10-30 LAB — LIPID PANEL
Cholesterol: 286 mg/dL — ABNORMAL HIGH (ref 0–200)
HDL: 59 mg/dL (ref >39.00)
Total CHOL/HDL Ratio: 5
Triglycerides: 416 mg/dL — ABNORMAL HIGH (ref 0.0–149.0)

## 2018-10-30 LAB — HEMOGLOBIN A1C: Hgb A1c MFr Bld: 6.1 % (ref 4.6–6.5)

## 2018-10-30 LAB — TSH: TSH: 2.52 u[IU]/mL (ref 0.35–4.50)

## 2018-10-30 LAB — IBC PANEL
Iron: 53 ug/dL (ref 42–165)
Saturation Ratios: 15.8 % — ABNORMAL LOW (ref 20.0–50.0)
Transferrin: 240 mg/dL (ref 212.0–360.0)

## 2018-10-30 LAB — LDL CHOLESTEROL, DIRECT: Direct LDL: 186 mg/dL

## 2018-10-30 MED ORDER — HYDROCORTISONE 1 % EX CREA: 1.0000 "application " | TOPICAL_CREAM | Freq: Two times a day (BID) | CUTANEOUS | 0 refills | Status: DC

## 2018-10-30 MED ORDER — TRIAMCINOLONE ACETONIDE 0.1 % EX CREA: 1.0000 "application " | TOPICAL_CREAM | Freq: Two times a day (BID) | CUTANEOUS | 0 refills | Status: DC

## 2018-10-30 NOTE — Assessment & Plan Note (Signed)
stable overall by history and exam, recent data reviewed with pt, and pt to continue medical treatment as before,  to f/u any worsening symptoms or concerns  

## 2018-10-30 NOTE — Patient Instructions (Addendum)
You had the flu shot today  Please continue all other medications as before, and refills have been done if requested - the creams  Please have the pharmacy call with any other refills you may need.  Please continue your efforts at being more active, low cholesterol diet, and weight control.  Please keep your appointments with your specialists as you may have planned  Please go to the LAB in the Basement (turn left off the elevator) for the tests to be done today  You will be contacted by phone if any changes need to be made immediately.  Otherwise, you will receive a letter about your results with an explanation, but please check with MyChart first.  Please remember to sign up for MyChart if you have not done so, as this will be important to you in the future with finding out test results, communicating by private email, and scheduling acute appointments online when needed.  Please return in 1 year for your yearly visit, or sooner if needed, with Lab testing done 3-5 days before

## 2018-10-30 NOTE — Assessment & Plan Note (Signed)
C/w atopic dermatitis, for triam cr prn,  to f/u any worsening symptoms or concerns

## 2018-10-30 NOTE — Assessment & Plan Note (Signed)
Declines fibrate, for lower chol lower fat diet

## 2018-10-30 NOTE — Progress Notes (Signed)
Subjective:    Patient ID: Kevin Farley, male    DOB: Sep 17, 1955, 63 y.o.   MRN: [id number]  HPI  Here to f/u; overall doing ok,  Pt denies chest pain, increasing sob or doe, wheezing, orthopnea, PND, increased LE swelling, palpitations, dizziness or syncope.  Pt denies new neurological symptoms such as new headache, or facial or extremity weakness or numbness.  Pt denies polydipsia, polyuria, or low sugar episode.  Pt states overall good compliance with meds, mostly trying to follow appropriate diet, with wt overall stable,  but little exercise however.  Does have typical atopic rash like reactions that come and go for several months to various sites such as upper legs and lower back, better with steroid cream in the past.  Also has recent recurrence of hemorrhoid irritiation and asks for refill proctosol HC.  Denies worsening reflux, abd pain, dysphagia, n/v, bowel change or blood. Past Medical History:  Diagnosis Date   Allergic rhinitis    Colon polyps    adenomatous 2011 & 2014   Family history of ischemic heart disease    GERD (gastroesophageal reflux disease)    Hyperlipidemia    Other abnormal glucose    A1c 6% in 06/2009   Supraspinatus tendon tear    Past Surgical History:  Procedure Laterality Date   BACK SURGERY  2006   Dr Carloyn Manner, [place]   cardic catherization  2005   negative; Dr Lyla Son   colon polyps  2011 & 2014   Dr.Jacobs    CORNEAL TRANSPLANT Left 1993   Dr Kathrin Penner   nasal bone surgery  2007   UPPER GASTROINTESTINAL ENDOSCOPY  2011   mild gastritis; Dr Ardis Hughs    reports that he has never smoked. He has never used smokeless tobacco. He reports current alcohol use of about 1.0 standard drinks of alcohol per week. He reports that he does not use drugs. family history includes Diabetes in his brother, brother, father, mother, and sister; Heart attack in his brother and father; Hypertension in his brother, father, mother, and sister; Kidney failure in his  brother and brother; Stroke in his mother; Transient ischemic attack in his brother. Allergies  Allergen Reactions   Aspirin     REACTION: hives States tolerates IBU & acetaminophen OK   Other     Walnuts & pecans cause hives   Codeine     Itching with codeine containing cough syrup 9/15   Zetia [Ezetimibe]     See 09/22/14 musculoskeletal pains   Ezetimibe-Simvastatin Hives    REACTION: myalgia   Current Outpatient Medications on File Prior to Visit  Medication Sig Dispense Refill   Coenzyme Q10 (CO Q-10) 100 MG CAPS Take 1 capsule by mouth daily.     dexlansoprazole (DEXILANT) 60 MG capsule Take 1 capsule (60 mg total) by mouth daily. 90 capsule 3   DM-Doxylamine-Acetaminophen (NYQUIL COLD & FLU PO) Take by mouth.     Flaxseed, Linseed, (FLAX SEED OIL PO) Take by mouth.     fluticasone (FLONASE) 50 MCG/ACT nasal spray 2 spray each side daily 48 mL 3   hydrocortisone (PROCTOSOL HC) 2.5 % rectal cream PLACE 1 APPLICATION RECTALLY 2 (TWO) TIMES DAILY. 28.35 g 1   LORazepam (ATIVAN) 0.5 MG tablet TAKE 1 TABLET BY MOUTH EVERY 8 TO 12 HOURS AS NEEDED 30 tablet 2   metFORMIN (GLUCOPHAGE-XR) 500 MG 24 hr tablet Take 1 tablet (500 mg total) by mouth daily with breakfast. 90 tablet 3   Multiple Vitamin (  MULTIVITAMIN) tablet Take 1 tablet by mouth daily.     Omega-3 Fatty Acids (FISH OIL) 500 MG CAPS Take 1 capsule by mouth daily.     rosuvastatin (CRESTOR) 40 MG tablet Take 1 tablet (40 mg total) by mouth every other day. 45 tablet 3   [DISCONTINUED] Lansoprazole (PREVACID PO) Take by mouth daily.       [DISCONTINUED] omeprazole (PRILOSEC) 40 MG capsule Take 1 capsule (40 mg total) by mouth daily. 180 capsule 1   No current facility-administered medications on file prior to visit.    Review of Systems  Constitutional: Negative for other unusual diaphoresis or sweats HENT: Negative for ear discharge or swelling Eyes: Negative for other worsening visual  disturbances Respiratory: Negative for stridor or other swelling  Gastrointestinal: Negative for worsening distension or other blood Genitourinary: Negative for retention or other urinary change Musculoskeletal: Negative for other MSK pain or swelling Skin: Negative for color change or other new lesions Neurological: Negative for worsening tremors and other numbness  Psychiatric/Behavioral: Negative for worsening agitation or other fatigue All otherwise neg per pt     Objective:   Physical Exam BP 126/82    Pulse 78    Ht 5\' 6"  (1.676 m)    Wt 138 lb (62.6 kg)    SpO2 98%    BMI 22.27 kg/m  VS noted,  Constitutional: Pt appears in NAD HENT: Head: NCAT.  Right Ear: External ear normal.  Left Ear: External ear normal.  Eyes: . Pupils are equal, round, and reactive to light. Conjunctivae and EOM are normal Nose: without d/c or deformity Neck: Neck supple. Gross normal ROM Cardiovascular: Normal rate and regular rhythm.   Pulmonary/Chest: Effort normal and breath sounds without rales or wheezing.  Abd:  Soft, NT, ND, + BS, no organomegaly Neurological: Pt is alert. At baseline orientation, motor grossly intact Skin: Skin is warm. No rashes, other new lesions, no LE edema Psychiatric: Pt behavior is normal without agitation   All otherwise neg per pt Lab Results  Component Value Date   WBC 5.5 10/30/2018   HGB 13.5 10/30/2018   HCT 40.5 10/30/2018   PLT 180.0 10/30/2018   GLUCOSE 92 10/30/2018   CHOL 286 (H) 10/30/2018   TRIG (H) 10/30/2018    416.0 Triglyceride is over 400; calculations on Lipids are invalid.   HDL 59.00 10/30/2018   LDLDIRECT 186.0 10/30/2018   LDLCALC 142 (H) 11/18/2016   ALT 23 10/30/2018   AST 16 10/30/2018   NA 140 10/30/2018   K 4.1 10/30/2018   CL 103 10/30/2018   CREATININE 0.99 10/30/2018   BUN 20 10/30/2018   CO2 29 10/30/2018   TSH 2.52 10/30/2018   PSA 1.27 10/30/2018   HGBA1C 6.1 10/30/2018      Assessment & Plan:

## 2018-10-30 NOTE — Assessment & Plan Note (Signed)
Ok for proctosol [facility] prn

## 2018-11-23 ENCOUNTER — Other Ambulatory Visit: Payer: Self-pay | Admitting: Internal Medicine

## 2018-11-27 ENCOUNTER — Ambulatory Visit: Payer: BLUE CROSS/BLUE SHIELD | Admitting: Internal Medicine

## 2019-02-18 ENCOUNTER — Other Ambulatory Visit: Payer: Self-pay | Admitting: Internal Medicine

## 2019-04-02 DIAGNOSIS — Z03818 Encounter for observation for suspected exposure to other biological agents ruled out: Secondary | ICD-10-CM | POA: Diagnosis not present

## 2019-04-02 DIAGNOSIS — Z20828 Contact with and (suspected) exposure to other viral communicable diseases: Secondary | ICD-10-CM | POA: Diagnosis not present

## 2019-04-16 ENCOUNTER — Ambulatory Visit: Payer: BC Managed Care – PPO | Admitting: Internal Medicine

## 2019-04-16 ENCOUNTER — Other Ambulatory Visit: Payer: Self-pay

## 2019-04-16 ENCOUNTER — Encounter: Payer: Self-pay | Admitting: Internal Medicine

## 2019-04-16 ENCOUNTER — Ambulatory Visit (INDEPENDENT_AMBULATORY_CARE_PROVIDER_SITE_OTHER): Payer: BC Managed Care – PPO

## 2019-04-16 VITALS — BP 140/82 | HR 72 | Temp 98.3°F | Ht 66.0 in | Wt 144.0 lb

## 2019-04-16 DIAGNOSIS — E782 Mixed hyperlipidemia: Secondary | ICD-10-CM | POA: Diagnosis not present

## 2019-04-16 DIAGNOSIS — Z0000 Encounter for general adult medical examination without abnormal findings: Secondary | ICD-10-CM

## 2019-04-16 DIAGNOSIS — R7303 Prediabetes: Secondary | ICD-10-CM

## 2019-04-16 DIAGNOSIS — R42 Dizziness and giddiness: Secondary | ICD-10-CM | POA: Insufficient documentation

## 2019-04-16 DIAGNOSIS — I10 Essential (primary) hypertension: Secondary | ICD-10-CM

## 2019-04-16 HISTORY — DX: Essential (primary) hypertension: I10

## 2019-04-16 MED ORDER — LOSARTAN POTASSIUM 50 MG PO TABS: 50.0000 mg | ORAL_TABLET | Freq: Every day | ORAL | 3 refills | Status: DC

## 2019-04-16 NOTE — Patient Instructions (Addendum)
Your EKG was OK today  Please take all new medication as prescribed - the losartan 50 mg per day (no side effect)  Please continue all other medications as before, and refills have been done if requested.  Please have the pharmacy call with any other refills you may need.  Please continue your efforts at being more active, low cholesterol diet, and weight control.  Please keep your appointments with your specialists as you may have planned  You will be contacted regarding the referral for: echocardiogram, and CT cardiac score  Please go to the XRAY Department in the first floor for the x-ray testing  Please go to the LAB at the blood drawing area for the tests to be done  You will be contacted by phone if any changes need to be made immediately.  Otherwise, you will receive a letter about your results with an explanation, but please check with MyChart first.  Please remember to sign up for MyChart if you have not done so, as this will be important to you in the future with finding out test results, communicating by private email, and scheduling acute appointments online when needed.  Please make an Appointment to return in 3 weeks

## 2019-04-16 NOTE — Addendum Note (Signed)
Addended by: Cresenciano Lick on: 04/16/2019 04:42 PM   Modules accepted: Orders

## 2019-04-16 NOTE — Progress Notes (Signed)
Subjective:    Patient ID: Kevin Farley, male    DOB: Mar 22, 1955, 64 y.o.   MRN: [id number]  HPI  Here with wife who gives hx of persistent elevated BP in the sbp 150s and unusual recurrent dizziness with sitting up or lying down, Pt denies chest pain, increased sob or doe, wheezing, orthopnea, PND, increased LE swelling, palpitations, or syncope. Stress test 2012 neg.  Cath neg per pt about 15 yrs ago.  Wife is certain he has a significant problem and needs testing.  Pt denies new neurological symptoms such as new headache, or facial or extremity weakness or numbness   Pt denies polydipsia, polyuria, Past Medical History:  Diagnosis Date  . Allergic rhinitis   . Colon polyps    adenomatous 2011 & 2014  . Family history of ischemic heart disease   . GERD (gastroesophageal reflux disease)   . HTN (hypertension) 04/16/2019  . Hyperlipidemia   . Other abnormal glucose    A1c 6% in 06/2009  . Supraspinatus tendon tear    Past Surgical History:  Procedure Laterality Date  . BACK SURGERY  2006   Dr Carloyn Manner, [place]  . cardic catherization  2005   negative; Dr Lyla Son  . colon polyps  2011 & 2014   Dr.Jacobs   . CORNEAL TRANSPLANT Left 1993   Dr Kathrin Penner  . nasal bone surgery  2007  . UPPER GASTROINTESTINAL ENDOSCOPY  2011   mild gastritis; Dr Ardis Hughs    reports that he has never smoked. He has never used smokeless tobacco. He reports current alcohol use of about 1.0 standard drinks of alcohol per week. He reports that he does not use drugs. family history includes Diabetes in his brother, brother, father, mother, and sister; Heart attack in his brother and father; Hypertension in his brother, father, mother, and sister; Kidney failure in his brother and brother; Stroke in his mother; Transient ischemic attack in his brother. Allergies  Allergen Reactions  . Aspirin     REACTION: hives States tolerates IBU & acetaminophen OK  . Other     Walnuts & pecans cause hives  . Codeine     Itching  with codeine containing cough syrup 9/15  . Zetia [Ezetimibe]     See 09/22/14 musculoskeletal pains  . Ezetimibe-Simvastatin Hives    REACTION: myalgia   Current Outpatient Medications on File Prior to Visit  Medication Sig Dispense Refill  . Coenzyme Q10 (CO Q-10) 100 MG CAPS Take 1 capsule by mouth daily.    Marland Kitchen dexlansoprazole (DEXILANT) 60 MG capsule Take 1 capsule (60 mg total) by mouth daily. 90 capsule 3  . Flaxseed, Linseed, (FLAX SEED OIL PO) Take by mouth.    . fluticasone (FLONASE) 50 MCG/ACT nasal spray 2 spray each side daily 48 mL 3  . hydrocortisone (ANUSOL-HC) 2.5 % rectal cream PLACE 1 APPLICATION RECTALLY 2 (TWO) TIMES DAILY. 30 g 2  . hydrocortisone cream 1 % Apply 1 application topically 2 (two) times daily. 30 g 0  . LORazepam (ATIVAN) 0.5 MG tablet TAKE 1 TABLET BY MOUTH EVERY 8 TO 12 HOURS AS NEEDED 30 tablet 2  . metFORMIN (GLUCOPHAGE-XR) 500 MG 24 hr tablet Take 1 tablet (500 mg total) by mouth daily with breakfast. 90 tablet 3  . Multiple Vitamin (MULTIVITAMIN) tablet Take 1 tablet by mouth daily.    . Omega-3 Fatty Acids (FISH OIL) 500 MG CAPS Take 1 capsule by mouth daily.    . rosuvastatin (CRESTOR) 40 MG tablet  Take 1 tablet (40 mg total) by mouth every other day. 45 tablet 3  . triamcinolone cream (KENALOG) 0.1 % APPLY TO AFFECTED AREA TWICE A DAY 30 g 0  . [DISCONTINUED] Lansoprazole (PREVACID PO) Take by mouth daily.      . [DISCONTINUED] omeprazole (PRILOSEC) 40 MG capsule Take 1 capsule (40 mg total) by mouth daily. 180 capsule 1   No current facility-administered medications on file prior to visit.   Review of Systems All otherwise neg per pt     Objective:   Physical Exam BP 140/82   Pulse 72   Temp 98.3 F (36.8 C)   Ht 5\' 6"  (1.676 m)   Wt 144 lb (65.3 kg)   SpO2 99%   BMI 23.24 kg/m  VS noted, non toxic Constitutional: Pt appears in NAD HENT: Head: NCAT.  Right Ear: External ear normal.  Left Ear: External ear normal.  Eyes: . Pupils  are equal, round, and reactive to light. Conjunctivae and EOM are normal Nose: without d/c or deformity Neck: Neck supple. Gross normal ROM Cardiovascular: Normal rate and regular rhythm.   Pulmonary/Chest: Effort normal and breath sounds without rales or wheezing.  Abd:  Soft, NT, ND, + BS, no organomegaly Neurological: Pt is alert. At baseline orientation, motor grossly intact Skin: Skin is warm. No rashes, other new lesions, no LE edema Psychiatric: Pt behavior is normal without agitation  All otherwise neg per pt  ECG today I have personally interpreted:  NSR without ischemic changes    Assessment & Plan:

## 2019-04-16 NOTE — Addendum Note (Signed)
Addended by: Cresenciano Lick on: 04/16/2019 04:41 PM   Modules accepted: Orders

## 2019-04-17 ENCOUNTER — Encounter: Payer: Self-pay | Admitting: Internal Medicine

## 2019-04-17 LAB — URINALYSIS, ROUTINE W REFLEX MICROSCOPIC
Bilirubin Urine: NEGATIVE
Glucose, UA: NEGATIVE
Hgb urine dipstick: NEGATIVE
Ketones, ur: NEGATIVE
Leukocytes,Ua: NEGATIVE
Nitrite: NEGATIVE
Protein, ur: NEGATIVE
Specific Gravity, Urine: 1.006 (ref 1.001–1.03)
pH: 7.5 (ref 5.0–8.0)

## 2019-04-17 LAB — CBC WITH DIFFERENTIAL/PLATELET
Absolute Monocytes: 494 cells/uL (ref 200–950)
Basophils Absolute: 28 cells/uL (ref 0–200)
Basophils Relative: 0.6 %
Eosinophils Absolute: 89 cells/uL (ref 15–500)
Eosinophils Relative: 1.9 %
HCT: 39.7 % (ref 38.5–50.0)
Hemoglobin: 13.5 g/dL (ref 13.2–17.1)
Lymphs Abs: 1264 cells/uL (ref 850–3900)
MCH: 28.2 pg (ref 27.0–33.0)
MCHC: 34 g/dL (ref 32.0–36.0)
MCV: 82.9 fL (ref 80.0–100.0)
MPV: 11.5 fL (ref 7.5–12.5)
Monocytes Relative: 10.5 %
Neutro Abs: 2825 cells/uL (ref 1500–7800)
Neutrophils Relative %: 60.1 %
Platelets: 185 10*3/uL (ref 140–400)
RBC: 4.79 10*6/uL (ref 4.20–5.80)
RDW: 13.1 % (ref 11.0–15.0)
Total Lymphocyte: 26.9 %
WBC: 4.7 10*3/uL (ref 3.8–10.8)

## 2019-04-17 LAB — HEMOGLOBIN A1C
Hgb A1c MFr Bld: 6 % of total Hgb — ABNORMAL HIGH (ref <5.7)
Mean Plasma Glucose: 126 (calc)
eAG (mmol/L): 7 (calc)

## 2019-04-17 LAB — BASIC METABOLIC PANEL
BUN: 17 mg/dL (ref 7–25)
CO2: 26 mmol/L (ref 20–32)
Calcium: 9.3 mg/dL (ref 8.6–10.3)
Chloride: 104 mmol/L (ref 98–110)
Creat: 1.09 mg/dL (ref 0.70–1.25)
Glucose, Bld: 102 mg/dL — ABNORMAL HIGH (ref 65–99)
Potassium: 4 mmol/L (ref 3.5–5.3)
Sodium: 141 mmol/L (ref 135–146)

## 2019-04-17 LAB — HEPATIC FUNCTION PANEL
AG Ratio: 1.6 (calc) (ref 1.0–2.5)
ALT: 26 U/L (ref 9–46)
AST: 20 U/L (ref 10–35)
Albumin: 4.2 g/dL (ref 3.6–5.1)
Alkaline phosphatase (APISO): 49 U/L (ref 35–144)
Bilirubin, Direct: 0.1 mg/dL (ref 0.0–0.2)
Globulin: 2.7 g/dL (calc) (ref 1.9–3.7)
Indirect Bilirubin: 0.3 mg/dL (calc) (ref 0.2–1.2)
Total Bilirubin: 0.4 mg/dL (ref 0.2–1.2)
Total Protein: 6.9 g/dL (ref 6.1–8.1)

## 2019-04-17 LAB — LIPID PANEL
Cholesterol: 247 mg/dL — ABNORMAL HIGH (ref <200)
HDL: 56 mg/dL (ref >=40)
Non-HDL Cholesterol (Calc): 191 mg/dL (calc) — ABNORMAL HIGH (ref <130)
Total CHOL/HDL Ratio: 4.4 (calc) (ref <5.0)
Triglycerides: 442 mg/dL — ABNORMAL HIGH (ref <150)

## 2019-04-17 LAB — PSA: PSA: 0.9 ng/mL (ref <=4.0)

## 2019-04-17 LAB — TSH: TSH: 3.28 mIU/L (ref 0.40–4.50)

## 2019-04-17 NOTE — Assessment & Plan Note (Addendum)
Etiology unclear, ecg reviewed, also for cxr, labs as ordered, and echo, and ct cardiac score, also consider cardiology  I spent 40 minutes in preparing to see the patient by review of recent labs, imaging and procedures, obtaining and reviewing separately obtained history, communicating with the patient and family or caregiver, ordering medications, tests or procedures, and documenting clinical information in the EHR including the differential Dx, treatment, and any further evaluation and other management of dizziness, hyperglycemia, HOD, HTN,

## 2019-04-17 NOTE — Assessment & Plan Note (Signed)
stable overall by history and exam, recent data reviewed with pt, and pt to continue medical treatment as before,  to f/u any worsening symptoms or concerns  

## 2019-04-17 NOTE — Assessment & Plan Note (Signed)
Mild uncontrolled , for losartan 50 mg daily

## 2019-04-20 ENCOUNTER — Telehealth (HOSPITAL_COMMUNITY): Payer: Self-pay | Admitting: Internal Medicine

## 2019-04-20 NOTE — Telephone Encounter (Signed)
I called patient to schedule Echocardiogram per your order and patient did not wish to schedule at this time.  Order will be removed from [facility] and if patient wants to have in the future you may reinstate order.  Thank you, Edmon Crape

## 2019-04-23 ENCOUNTER — Ambulatory Visit: Payer: BC Managed Care – PPO | Attending: Internal Medicine

## 2019-04-23 DIAGNOSIS — Z23 Encounter for immunization: Secondary | ICD-10-CM

## 2019-04-23 NOTE — Progress Notes (Signed)
   Covid-19 Vaccination Clinic  Name:  Kevin Farley    MRN: [id number] DOB: 1955/08/27  04/23/2019  [name] was observed post Covid-19 immunization for 15 minutes without incident. He was provided with Vaccine Information Sheet and instruction to access the V-Safe system.   [name] was instructed to call 911 with any severe reactions post vaccine: Marland Kitchen Difficulty breathing  . Swelling of face and throat  . A fast heartbeat  . A bad rash all over body  . Dizziness and weakness   Immunizations Administered    Name Date Dose VIS Date Route   Pfizer COVID-19 Vaccine 04/23/2019 10:08 AM 0.3 mL 01/22/2019 Intramuscular   Manufacturer: [place]   Lot: [id number]   [place]: [id number]

## 2019-05-17 ENCOUNTER — Ambulatory Visit: Payer: BC Managed Care – PPO | Attending: Internal Medicine

## 2019-05-17 DIAGNOSIS — Z23 Encounter for immunization: Secondary | ICD-10-CM

## 2019-05-17 NOTE — Progress Notes (Signed)
   Covid-19 Vaccination Clinic  Name:  Kevin Farley    MRN: [id number] DOB: [birth year]  05/17/2019  [name] was observed post Covid-19 immunization for 15 minutes without incident. He was provided with Vaccine Information Sheet and instruction to access the V-Safe system.   [name] was instructed to call 911 with any severe reactions post vaccine: Marland Kitchen Difficulty breathing  . Swelling of face and throat  . A fast heartbeat  . A bad rash all over body  . Dizziness and weakness   Immunizations Administered    Name Date Dose VIS Date Route   Pfizer COVID-19 Vaccine 05/17/2019  3:19 PM 0.3 mL 01/22/2019 Intramuscular   Manufacturer: [place]   Lot: [id number]   [place]: [id number]

## 2019-07-02 ENCOUNTER — Other Ambulatory Visit: Payer: Self-pay | Admitting: Internal Medicine

## 2019-07-02 NOTE — Telephone Encounter (Signed)
Please refill as per office routine med refill policy (all routine meds refilled for 3 mo or monthly per pt preference up to one year from last visit, then month to month grace period for 3 mo, then further med refills will have to be denied)  

## 2019-07-11 ENCOUNTER — Other Ambulatory Visit: Payer: Self-pay | Admitting: Internal Medicine

## 2019-07-14 ENCOUNTER — Other Ambulatory Visit: Payer: Self-pay | Admitting: Internal Medicine

## 2019-07-19 ENCOUNTER — Ambulatory Visit: Payer: BC Managed Care – PPO | Admitting: Family Medicine

## 2019-07-19 ENCOUNTER — Other Ambulatory Visit: Payer: Self-pay

## 2019-07-19 ENCOUNTER — Ambulatory Visit (INDEPENDENT_AMBULATORY_CARE_PROVIDER_SITE_OTHER): Payer: BC Managed Care – PPO

## 2019-07-19 ENCOUNTER — Ambulatory Visit: Payer: Self-pay

## 2019-07-19 ENCOUNTER — Encounter: Payer: Self-pay | Admitting: Family Medicine

## 2019-07-19 VITALS — BP 158/90 | HR 91 | Ht 66.0 in | Wt 144.4 lb

## 2019-07-19 DIAGNOSIS — M79644 Pain in right finger(s): Secondary | ICD-10-CM

## 2019-07-19 DIAGNOSIS — M5412 Radiculopathy, cervical region: Secondary | ICD-10-CM | POA: Diagnosis not present

## 2019-07-19 DIAGNOSIS — G8929 Other chronic pain: Secondary | ICD-10-CM

## 2019-07-19 DIAGNOSIS — M79642 Pain in left hand: Secondary | ICD-10-CM | POA: Diagnosis not present

## 2019-07-19 DIAGNOSIS — M79646 Pain in unspecified finger(s): Secondary | ICD-10-CM

## 2019-07-19 DIAGNOSIS — M79645 Pain in left finger(s): Secondary | ICD-10-CM

## 2019-07-19 DIAGNOSIS — M25531 Pain in right wrist: Secondary | ICD-10-CM | POA: Diagnosis not present

## 2019-07-19 DIAGNOSIS — M25532 Pain in left wrist: Secondary | ICD-10-CM

## 2019-07-19 DIAGNOSIS — M79641 Pain in right hand: Secondary | ICD-10-CM | POA: Diagnosis not present

## 2019-07-19 DIAGNOSIS — M47812 Spondylosis without myelopathy or radiculopathy, cervical region: Secondary | ICD-10-CM | POA: Diagnosis not present

## 2019-07-19 NOTE — Patient Instructions (Signed)
Thank you for coming in today. Plan for physical therapy at [facility] in [place].  They should call you within 1 week.  If nobody calls you  Let me know please.  Continue voltaren gel.  Recheck in about 1 month.  Return sooner if needed.  Get xrays today.

## 2019-07-19 NOTE — Progress Notes (Signed)
Subjective:    CC: R hand pain  I, Molly Weber, LAT, ATC, am serving as scribe for Dr. Lynne Leader.  HPI: Pt is a 64 y/o male presenting w/ c/o B wrist and thumb pain x 2-3 months w/ no known MOI.  He locates his pain to his B thumbs and wrists, R>L.  He rates his pain as mild and describes his pain as aching.  Additionally he notes some numbness and tingling extending to his second and third digits on his left hand when he moves his neck.  He denies much tingling in his palmar hands digits 1 through 3 on both sides.  Occasionally he will wake up with some numbness or tingling but that not much of a problem.  He has not tried much treatment yet.  R hand numbness/tingling: yes in his L fingers 1-3 Neck pain: yes R hand swelling: no Aggravating factors: nothing in particular but increased pain at night Treatments tried: Voltaren; topical rub  Pertinent review of Systems: No fevers or chills  Relevant historical information: Hypertension.  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   Objective:    Vitals:   07/19/19 1006  BP: (!) 158/90  Pulse: 91  SpO2: 98%   General: Well Developed, well nourished, and in no acute distress.   MSK: C-spine normal-appearing nontender normal motion.  Minimally positive left-sided Spurling's test. Upper extremity strength reflexes and sensation are equal normal throughout bilateral.  Left elbow normal-appearing nontender normal motion and strength.  Right hand and wrist: Slight prominent first CMC and MCP.  Otherwise normal-appearing Normal hand and wrist motion. Mildly tender palpation first CMC and MCP otherwise nontender. Normal strength. Mildly positive Phalen's test. Negative Tinel's test.  Left hand and wrist: Slight prominent first CMC and MCP.  Otherwise normal-appearing Normal hand and wrist motion. Tender palpation first CMC and MCP otherwise nontender. Normal strength. Mildly positive Phalen's  test. Negative Tinel's test.  Lab and Radiology Results X-ray images C-spine and hands bilaterally obtained today personally and independently reviewed  C-spine: Mild DDD C6-7 and C7-1.  No acute fractures or significant deformity.  Right hand: Largely normal-appearing with no acute fractures.  No severe DJD.  Left hand: Largely normal-appearing with no acute fractures.  No severe DJD.  Await formal radiology review   Impression and Recommendations:    Assessment and Plan: 64 y.o. male with bilateral hand pain largely due to mild DJD or synovitis at first MCP and CMC.  No severe degenerative changes seen on x-ray today however radiology overread is still pending.  Plan for trial of physical therapy.  If not sufficient would consider dedicated hand therapy.  Refer to [facility] in [place].  Additionally consider Voltaren gel.    Patient also has some paresthesias in his hands bilaterally.  Left hand likely to be C7 radiculopathy.  Distribution of symptoms is consistent with this as is changes seen on cervical spine x-ray.  Patient may have minimal carpal tunnel syndrome bilaterally as well however this is certainly not a dominant finding.  Watchful waiting.  Recheck back in about a month.  PDMP not reviewed this encounter. Orders Placed This Encounter  Procedures  . DG Hand Complete Left    Standing Status:   Future    Number of Occurrences:   1    Standing Expiration Date:   07/18/2020    Order Specific Question:   Reason for Exam (SYMPTOM  OR DIAGNOSIS REQUIRED)  Answer:   eval BL hand/thumb pain    Order Specific Question:   Preferred imaging location?    Answer:   [facility]    Order Specific Question:   Radiology Contrast Protocol - do NOT remove file path    Answer:   \\[email or web address]s.[email or web address]  . DG Hand Complete Right    Standing Status:   Future    Number of Occurrences:   1    Standing Expiration Date:   07/18/2020     Order Specific Question:   Reason for Exam (SYMPTOM  OR DIAGNOSIS REQUIRED)    Answer:   eval hand pain B    Order Specific Question:   Preferred imaging location?    Answer:   [facility]    Order Specific Question:   Radiology Contrast Protocol - do NOT remove file path    Answer:   \\[email or web address]\Radiant\DXFluoroContrastProtocols.pdf  . DG Cervical Spine Complete    Standing Status:   Future    Number of Occurrences:   1    Standing Expiration Date:   07/18/2020    Order Specific Question:   Reason for Exam (SYMPTOM  OR DIAGNOSIS REQUIRED)    Answer:   eval left C7 radiculopathy    Order Specific Question:   Preferred imaging location?    Answer:   [facility]    Order Specific Question:   Radiology Contrast Protocol - do NOT remove file path    Answer:   \\[email or web address]\[email or web address]s.[email or web address]  . Ambulatory referral to Physical Therapy    Referral Priority:   Routine    Referral Type:   Physical Medicine    Referral Reason:   Specialty Services Required    Requested Specialty:   Physical Therapy    Number of Visits Requested:   1   No orders of the defined types were placed in this encounter.   Discussed warning signs or symptoms. Please see discharge instructions. Patient expresses understanding.   The above documentation has been reviewed and is accurate and complete Lynne Leader, M.D.

## 2019-07-20 NOTE — Progress Notes (Signed)
Xray cspine shows arthritis at C6-C7 which could cause a pinched nerve at the left hand.

## 2019-07-20 NOTE — Progress Notes (Signed)
Xray right hand looks normal.

## 2019-07-20 NOTE — Progress Notes (Signed)
Xray left hand looks normal.

## 2019-07-26 DIAGNOSIS — M25531 Pain in right wrist: Secondary | ICD-10-CM | POA: Diagnosis not present

## 2019-07-26 DIAGNOSIS — M25542 Pain in joints of left hand: Secondary | ICD-10-CM | POA: Diagnosis not present

## 2019-07-26 DIAGNOSIS — M25532 Pain in left wrist: Secondary | ICD-10-CM | POA: Diagnosis not present

## 2019-07-26 DIAGNOSIS — M25541 Pain in joints of right hand: Secondary | ICD-10-CM | POA: Diagnosis not present

## 2019-08-02 DIAGNOSIS — M25531 Pain in right wrist: Secondary | ICD-10-CM | POA: Diagnosis not present

## 2019-08-02 DIAGNOSIS — M25541 Pain in joints of right hand: Secondary | ICD-10-CM | POA: Diagnosis not present

## 2019-08-02 DIAGNOSIS — M25542 Pain in joints of left hand: Secondary | ICD-10-CM | POA: Diagnosis not present

## 2019-08-02 DIAGNOSIS — M25532 Pain in left wrist: Secondary | ICD-10-CM | POA: Diagnosis not present

## 2019-08-05 DIAGNOSIS — M25542 Pain in joints of left hand: Secondary | ICD-10-CM | POA: Diagnosis not present

## 2019-08-05 DIAGNOSIS — M25541 Pain in joints of right hand: Secondary | ICD-10-CM | POA: Diagnosis not present

## 2019-08-05 DIAGNOSIS — M25532 Pain in left wrist: Secondary | ICD-10-CM | POA: Diagnosis not present

## 2019-08-05 DIAGNOSIS — M25531 Pain in right wrist: Secondary | ICD-10-CM | POA: Diagnosis not present

## 2019-08-09 DIAGNOSIS — M25541 Pain in joints of right hand: Secondary | ICD-10-CM | POA: Diagnosis not present

## 2019-08-09 DIAGNOSIS — M25532 Pain in left wrist: Secondary | ICD-10-CM | POA: Diagnosis not present

## 2019-08-09 DIAGNOSIS — M25531 Pain in right wrist: Secondary | ICD-10-CM | POA: Diagnosis not present

## 2019-08-09 DIAGNOSIS — M25542 Pain in joints of left hand: Secondary | ICD-10-CM | POA: Diagnosis not present

## 2019-08-19 ENCOUNTER — Other Ambulatory Visit: Payer: Self-pay | Admitting: Internal Medicine

## 2019-08-19 ENCOUNTER — Ambulatory Visit: Payer: Self-pay

## 2019-08-19 ENCOUNTER — Other Ambulatory Visit: Payer: Self-pay

## 2019-08-19 ENCOUNTER — Ambulatory Visit (INDEPENDENT_AMBULATORY_CARE_PROVIDER_SITE_OTHER): Payer: BC Managed Care – PPO | Admitting: Family Medicine

## 2019-08-19 ENCOUNTER — Encounter: Payer: Self-pay | Admitting: Family Medicine

## 2019-08-19 VITALS — BP 138/102 | HR 85 | Ht 66.0 in | Wt 145.0 lb

## 2019-08-19 DIAGNOSIS — M79644 Pain in right finger(s): Secondary | ICD-10-CM

## 2019-08-19 DIAGNOSIS — M25531 Pain in right wrist: Secondary | ICD-10-CM

## 2019-08-19 DIAGNOSIS — G8929 Other chronic pain: Secondary | ICD-10-CM

## 2019-08-19 DIAGNOSIS — M79646 Pain in unspecified finger(s): Secondary | ICD-10-CM | POA: Diagnosis not present

## 2019-08-19 DIAGNOSIS — M25532 Pain in left wrist: Secondary | ICD-10-CM

## 2019-08-19 DIAGNOSIS — M5412 Radiculopathy, cervical region: Secondary | ICD-10-CM | POA: Diagnosis not present

## 2019-08-19 NOTE — Telephone Encounter (Signed)
Please refill as per office routine med refill policy (all routine meds refilled for 3 mo or monthly per pt preference up to one year from last visit, then month to month grace period for 3 mo, then further med refills will have to be denied)  

## 2019-08-19 NOTE — Patient Instructions (Addendum)
Thank you for coming in today. Plan to continue further PT.  Recheck in 6 weeks.  Let me know sooner if you are not doing well.  I have asked PT to also work on your neck.

## 2019-08-19 NOTE — Progress Notes (Signed)
I, Wendy Poet, LAT, ATC, am serving as scribe for Dr. Lynne Leader.  MACIO KISSOON is a 64 y.o. male who presents to [facility] today for f/u of B wrist and thumb pain, R>L.  He was last seen by Dr. Georgina Snell on 07/19/19 and was referred to [facility] and advised to use Voltaren gel.  Since his last visit, pt reports that he has had improvement. Feels about 50% better. Had had [place] treatment.   Diagnostic imaging: R and L hand XR- 07/19/19  Pertinent review of systems: no fever or chills  Relevant historical information: HTN, OSA   Exam:  BP (!) 138/102   Pulse 85   Ht 5\' 6"  (1.676 m)   Wt 145 lb (65.8 kg)   SpO2 98%   BMI 23.40 kg/m  General: Well Developed, well nourished, and in no acute distress.   MSK:  C-spine normal-appearing normal motion Right hand prominent appearing first CMC and MCP.  Normal motion nontender normal strength Left hand again prominent.  First CMC and MCP.  Normal motion nontender normal strength.    Lab and Radiology Results  DG Cervical Spine Complete  Result Date: 07/19/2019 CLINICAL DATA:  Left-sided hand tingling for 3 months.  No injury. EXAM: CERVICAL SPINE - COMPLETE 4+ VIEW COMPARISON:  05/25/2013 FINDINGS: Straightened cervical lordosis. No fracture, bone lesion or spondylolisthesis. Moderate loss of disc height at C6-C7. Remaining disc spaces are well preserved. There are small endplate osteophytes from C3 through C7. Mild bilateral neural foraminal narrowing at C6-C7 due to uncovertebral spurring, similar to the prior radiographs. Soft tissues are unremarkable. IMPRESSION: 1. No fracture or acute finding. 2. Degenerative changes most evident at C6-C7, mildly progressed when compared to the prior radiographs. Electronically Signed   By: Lajean Manes M.D.   On: 07/19/2019 16:18   DG Hand Complete Left  Result Date: 07/19/2019 CLINICAL DATA:  Left hand pain for 3 months.  No injury. EXAM: LEFT HAND - COMPLETE 3+  VIEW COMPARISON:  None. FINDINGS: There is no evidence of fracture or dislocation. There is no evidence of arthropathy or other focal bone abnormality. Soft tissues are unremarkable. IMPRESSION: Negative. Electronically Signed   By: Lajean Manes M.D.   On: 07/19/2019 16:16   DG Hand Complete Right  Result Date: 07/19/2019 CLINICAL DATA:  Right hand pain for 3 months.  No injury. EXAM: RIGHT HAND - COMPLETE 3+ VIEW COMPARISON:  None. FINDINGS: There is no evidence of fracture or dislocation. There is no evidence of arthropathy or other focal bone abnormality. Soft tissues are unremarkable. IMPRESSION: Negative. Electronically Signed   By: Lajean Manes M.D.   On: 07/19/2019 16:17   I, Lynne Leader, personally (independently) visualized and performed the interpretation of the images attached in this note.      Assessment and Plan: 64 y.o. male with bilateral hand pain.  Degenerative changes of the most dominant finding.  He is already had about 50% improvement with very limited physical therapy.  He would like to continue PT which I think is reasonable.  Recheck back in 6 weeks if not improving will proceed with injection likely as next up.  Cervical radiculopathy.  I do believe some of his pain is also cervical radiculopathy.  He does have degenerative changes at C6-C7 which could produce pain in his thumb as well.  Last physical therapy to work on his neck.  Again if not better will proceed with further work-up likely MRI  C-spine.   PDMP not reviewed this encounter. Orders Placed This Encounter  Procedures  . Ambulatory referral to Physical Therapy    Referral Priority:   Routine    Referral Type:   Physical Medicine    Referral Reason:   Specialty Services Required    Requested Specialty:   Physical Therapy    Number of Visits Requested:   1   No orders of the defined types were placed in this encounter.    Discussed warning signs or symptoms. Please see discharge instructions. Patient  expresses understanding.   The above documentation has been reviewed and is accurate and complete Lynne Leader, M.D.

## 2019-08-23 DIAGNOSIS — M25541 Pain in joints of right hand: Secondary | ICD-10-CM | POA: Diagnosis not present

## 2019-08-23 DIAGNOSIS — M25531 Pain in right wrist: Secondary | ICD-10-CM | POA: Diagnosis not present

## 2019-08-23 DIAGNOSIS — M25532 Pain in left wrist: Secondary | ICD-10-CM | POA: Diagnosis not present

## 2019-08-23 DIAGNOSIS — M25542 Pain in joints of left hand: Secondary | ICD-10-CM | POA: Diagnosis not present

## 2019-09-17 DIAGNOSIS — J029 Acute pharyngitis, unspecified: Secondary | ICD-10-CM | POA: Diagnosis not present

## 2019-09-17 DIAGNOSIS — Z1152 Encounter for screening for COVID-19: Secondary | ICD-10-CM | POA: Diagnosis not present

## 2019-09-17 DIAGNOSIS — R05 Cough: Secondary | ICD-10-CM | POA: Diagnosis not present

## 2019-09-17 DIAGNOSIS — R0982 Postnasal drip: Secondary | ICD-10-CM | POA: Diagnosis not present

## 2019-09-30 ENCOUNTER — Ambulatory Visit: Payer: BC Managed Care – PPO | Admitting: Family Medicine

## 2019-10-13 ENCOUNTER — Encounter: Payer: Self-pay | Admitting: Internal Medicine

## 2019-10-21 DIAGNOSIS — B974 Respiratory syncytial virus as the cause of diseases classified elsewhere: Secondary | ICD-10-CM | POA: Diagnosis not present

## 2019-10-21 DIAGNOSIS — U071 COVID-19: Secondary | ICD-10-CM | POA: Diagnosis not present

## 2019-10-21 DIAGNOSIS — J101 Influenza due to other identified influenza virus with other respiratory manifestations: Secondary | ICD-10-CM | POA: Diagnosis not present

## 2019-10-21 DIAGNOSIS — Z20828 Contact with and (suspected) exposure to other viral communicable diseases: Secondary | ICD-10-CM | POA: Diagnosis not present

## 2019-10-22 NOTE — Progress Notes (Signed)
Virtual Visit via Video Note  I connected with@ on 9 11 21   at  9:40 AM EDT by a video enabled telemedicine application and verified that I am speaking with the correct person using two identifiers. Location patient: home Location provider:work  office Persons participating in the virtual visit: patient, provider wife Kirtland Bouchard national recommendations  regarding COVID 19 pandemic   video visit is advised over in office visit for this patient.  Patient aware  of the limitations of evaluation and management by telemedicine and  availability of in person appointments. and agreed to proceed.   HPI: Kevin Farley presents for video visit [facility] for  new problem evaluation. Minimal scratchy st and maybe congestion but neg covid testing when  Recently  Family pos covid 2 children began 8 days ago had fevers and now better  Wife tested positive and sick for 5 days  Congestion weak feeling  Some pain with inspiration  He  Tested negative and is otherwise ok  ? Advice about booster    Both adult are vaccinated .   No cp sob asks about retesting  ROS: See pertinent positives and negatives per HPI. No resp cp sob  Past Medical History:  Diagnosis Date  . Allergic rhinitis   . Colon polyps    adenomatous 2011 & 2014  . Family history of ischemic heart disease   . GERD (gastroesophageal reflux disease)   . HTN (hypertension) 04/16/2019  . Hyperlipidemia   . Other abnormal glucose    A1c 6% in 06/2009  . Supraspinatus tendon tear     Past Surgical History:  Procedure Laterality Date  . BACK SURGERY  2006   Dr Carloyn Manner[place]  . cardic catherization  2005   negative; Dr Lyla Son  . colon polyps  2011 & 2014   Dr.Jacobs   . CORNEAL TRANSPLANT Left 1993   Dr Kathrin Penner  . nasal bone surgery  2007  . UPPER GASTROINTESTINAL ENDOSCOPY  2011   mild gastritis; Dr Ardis Hughs    Family History  Problem Relation Age of Onset  . Hypertension Mother   . Diabetes Mother   . Stroke  Mother        in 3s  . Diabetes Father   . Hypertension Father   . Heart attack Father        had open heart surgery  . Diabetes Brother        X2  . Heart attack Brother         MI in 7s  . Transient ischemic attack Brother        in 19s  . Kidney failure Brother   . Hypertension Brother   . Diabetes Sister         X 2  . Hypertension Sister   . Kidney failure Brother        renal transplant  . Diabetes Brother   . COPD Neg Hx   . Asthma Neg Hx   . Cancer Neg Hx   . Colon polyps Neg Hx     Social History   Tobacco Use  . Smoking status: Never Smoker  . Smokeless tobacco: Never Used  Substance Use Topics  . Alcohol use: Yes    Alcohol/week: 1.0 standard drink    Types: 1 Glasses of wine per week  . Drug use: No      Current Outpatient Medications:  .  Coenzyme Q10 (CO Q-10) 100 MG CAPS, Take 1 capsule  by mouth daily., Disp: , Rfl:  .  DEXILANT 60 MG capsule, TAKE 1 CAPSULE BY MOUTH EVERY DAY, Disp: 90 capsule, Rfl: 3 .  Flaxseed, Linseed, (FLAX SEED OIL PO), Take by mouth., Disp: , Rfl:  .  fluticasone (FLONASE) 50 MCG/ACT nasal spray, 2 spray each side daily, Disp: 48 mL, Rfl: 3 .  hydrocortisone (ANUSOL-HC) 2.5 % rectal cream, PLACE 1 APPLICATION RECTALLY 2 (TWO) TIMES DAILY., Disp: 30 g, Rfl: 2 .  hydrocortisone cream 1 %, Apply 1 application topically 2 (two) times daily., Disp: 30 g, Rfl: 0 .  LORazepam (ATIVAN) 0.5 MG tablet, TAKE 1 TABLET BY MOUTH EVERY 8 TO 12 HOURS AS NEEDED, Disp: 30 tablet, Rfl: 2 .  losartan (COZAAR) 50 MG tablet, Take 1 tablet (50 mg total) by mouth daily., Disp: 90 tablet, Rfl: 3 .  metFORMIN (GLUCOPHAGE-XR) 500 MG 24 hr tablet, Take 1 tablet (500 mg total) by mouth daily with breakfast., Disp: 90 tablet, Rfl: 3 .  Multiple Vitamin (MULTIVITAMIN) tablet, Take 1 tablet by mouth daily., Disp: , Rfl:  .  Omega-3 Fatty Acids (FISH OIL) 500 MG CAPS, Take 1 capsule by mouth daily., Disp: , Rfl:  .  rosuvastatin (CRESTOR) 40 MG tablet,  TAKE 1 TABLET BY MOUTH EVERY OTHER DAY, Disp: 45 tablet, Rfl: 2 .  triamcinolone cream (KENALOG) 0.1 %, APPLY TO AFFECTED AREA TWICE A DAY, Disp: 30 g, Rfl: 0  EXAM: BP Readings from Last 3 Encounters:  08/19/19 (!) 138/102  07/19/19 (!) 158/90  04/16/19 140/82    VITALS per patient if applicable: masked   GENERAL: alert, oriented, appears well and in no acute distress nl speech   HEENT: atraumatic, conjunttiva clear, no obvious abnormalities on inspection of external nose and ears  NECK: normal movements of the head and neck  LUNGS: on inspection no signs of respiratory distress, breathing rate appears normal, no obvious gross SOB, gasping or wheezing  CV: no obvious cyanosis  MS: moves all visible extremities without noticeable abnormality  PSYCH/NEURO: pleasant and cooperative, no obvious depression or anxiety, speech and thought processing grossly intact Lab Results  Component Value Date   WBC 4.7 04/16/2019   HGB 13.5 04/16/2019   HCT 39.7 04/16/2019   PLT 185 04/16/2019   GLUCOSE 102 (H) 04/16/2019   CHOL 247 (H) 04/16/2019   TRIG 442 (H) 04/16/2019   HDL 56 04/16/2019   LDLDIRECT 186.0 10/30/2018   [place]  04/16/2019     Comment:     . LDL cholesterol not calculated. Triglyceride levels greater than 400 mg/dL invalidate calculated LDL results. . Reference range: <100 . Desirable range <100 mg/dL for primary prevention;   <70 mg/dL for patients with CHD or diabetic patients  with > or = 2 CHD risk factors. Marland Kitchen LDL-C is now calculated using the Martin-Hopkins  calculation, which is a validated novel method providing  better accuracy than the Friedewald equation in the  estimation of LDL-C.  Cresenciano Genre et al. Annamaria Helling. 1751;025(85): 2061-2068  (http://education.[facility].com/faq/FAQ164)    ALT 26 04/16/2019   AST 20 04/16/2019   NA 141 04/16/2019   K 4.0 04/16/2019   CL 104 04/16/2019   CREATININE 1.09 04/16/2019   BUN 17 04/16/2019   CO2 26  04/16/2019   TSH 3.28 04/16/2019   PSA 0.9 04/16/2019   HGBA1C 6.0 (H) 04/16/2019    ASSESSMENT AND PLAN:  Discussed the following assessment and plan:    ICD-10-CM   1. Close exposure to COVID-19 virus  Z20.822    immunized  mod low risk age and ht  get retested  expectant managment   2. Throat and mouth symptom  R68.89   3. Counseled about COVID-19 virus infection  Z71.89    Can get retested in  3 days from last test  And   Isolation at  This time  No sig sx  options discussed  Wife pcp is [facility] and her message never go answered  Disc [facility]  And her risk  Will enter her record and  Refer for eval for [facility] infusion as she feels badly  But vaccinated  Counseled. About when to seek care and follow up   Expectant management and discussion of plan and treatment with opportunity to ask questions and all were answered. The patient agreed with the plan and demonstrated an understanding of the instructions.   Advised to call back or seek an in-person evaluation if worsening  or having  further concerns . No follow-ups on file.  34 minutes  review counsel family illness and plan    Shanon Ace, MD

## 2019-10-23 ENCOUNTER — Telehealth (INDEPENDENT_AMBULATORY_CARE_PROVIDER_SITE_OTHER): Payer: BC Managed Care – PPO | Admitting: Internal Medicine

## 2019-10-23 ENCOUNTER — Other Ambulatory Visit: Payer: Self-pay | Admitting: Internal Medicine

## 2019-10-23 DIAGNOSIS — Z7189 Other specified counseling: Secondary | ICD-10-CM | POA: Diagnosis not present

## 2019-10-23 DIAGNOSIS — R6889 Other general symptoms and signs: Secondary | ICD-10-CM

## 2019-10-23 DIAGNOSIS — Z20822 Contact with and (suspected) exposure to covid-19: Secondary | ICD-10-CM | POA: Diagnosis not present

## 2019-10-23 NOTE — Telephone Encounter (Signed)
Please refill as per office routine med refill policy (all routine meds refilled for 3 mo or monthly per pt preference up to one year from last visit, then month to month grace period for 3 mo, then further med refills will have to be denied)  

## 2019-10-25 DIAGNOSIS — Z20822 Contact with and (suspected) exposure to covid-19: Secondary | ICD-10-CM | POA: Diagnosis not present

## 2019-10-27 ENCOUNTER — Telehealth (INDEPENDENT_AMBULATORY_CARE_PROVIDER_SITE_OTHER): Payer: BC Managed Care – PPO | Admitting: Internal Medicine

## 2019-10-27 DIAGNOSIS — U071 COVID-19: Secondary | ICD-10-CM | POA: Diagnosis not present

## 2019-10-27 NOTE — Progress Notes (Signed)
Patient ID: Kevin Farley, male   DOB: 04/14/1955, 64 y.o.   MRN: [id number]  Virtual Visit via Video Note  I connected with Kevin Farley on 10/27/19 at 10:40 AM EDT by a video enabled telemedicine application and verified that I am speaking with the correct person using two identifiers.  Location of all participants today Patient: in car with wife driving Provider: at office   I discussed the limitations of evaluation and management by telemedicine and the availability of in person appointments. The patient expressed understanding and agreed to proceed.  History of Present Illness: Here to f/u with c/o 9/12 onset symptoms of nasal congestion mild only without HA, fever, ST, cough, CP, sob, wheezing, or chills.  No loss of taste or smell.  No nausea or diarrhea.  Family has been progessively found covid + since sept 1, now pt tested COVID + sept 13.  Overall doing well but asking for tx.  Wife has monoclonal ab last wk and felt at least 50% better almost immediately.  No other new complaints Past Medical History:  Diagnosis Date  . Allergic rhinitis   . Colon polyps    adenomatous 2011 & 2014  . Family history of ischemic heart disease   . GERD (gastroesophageal reflux disease)   . HTN (hypertension) 04/16/2019  . Hyperlipidemia   . Other abnormal glucose    A1c 6% in 06/2009  . Supraspinatus tendon tear    Past Surgical History:  Procedure Laterality Date  . BACK SURGERY  2006   Dr Carloyn Manner[place]  . cardic catherization  2005   negative; Dr Lyla Son  . colon polyps  2011 & 2014   Dr.Jacobs   . CORNEAL TRANSPLANT Left 1993   Dr Kathrin Penner  . nasal bone surgery  2007  . UPPER GASTROINTESTINAL ENDOSCOPY  2011   mild gastritis; Dr Ardis Hughs    reports that he has never smoked. He has never used smokeless tobacco. He reports current alcohol use of about 1.0 standard drink of alcohol per week. He reports that he does not use drugs. family history includes Diabetes in his brother, brother, father,  mother, and sister; Heart attack in his brother and father; Hypertension in his brother, father, mother, and sister; Kidney failure in his brother and brother; Stroke in his mother; Transient ischemic attack in his brother. Allergies  Allergen Reactions  . Aspirin     REACTION: hives States tolerates IBU & acetaminophen OK  . Other     Walnuts & pecans cause hives  . Codeine     Itching with codeine containing cough syrup 9/15  . Zetia [Ezetimibe]     See 09/22/14 musculoskeletal pains  . Ezetimibe-Simvastatin Hives    REACTION: myalgia   Current Outpatient Medications on File Prior to Visit  Medication Sig Dispense Refill  . Coenzyme Q10 (CO Q-10) 100 MG CAPS Take 1 capsule by mouth daily.    Marland Kitchen DEXILANT 60 MG capsule TAKE 1 CAPSULE BY MOUTH EVERY DAY 90 capsule 3  . Flaxseed, Linseed, (FLAX SEED OIL PO) Take by mouth.    . fluticasone (FLONASE) 50 MCG/ACT nasal spray SPRAY 2 SPRAYS INTO EACH NOSTRIL DAILY 48 mL 3  . hydrocortisone (ANUSOL-HC) 2.5 % rectal cream PLACE 1 APPLICATION RECTALLY 2 (TWO) TIMES DAILY. 30 g 2  . hydrocortisone cream 1 % Apply 1 application topically 2 (two) times daily. 30 g 0  . LORazepam (ATIVAN) 0.5 MG tablet TAKE 1 TABLET BY MOUTH EVERY 8 TO 12 HOURS  AS NEEDED 30 tablet 2  . losartan (COZAAR) 50 MG tablet Take 1 tablet (50 mg total) by mouth daily. 90 tablet 3  . metFORMIN (GLUCOPHAGE-XR) 500 MG 24 hr tablet TAKE 1 TABLET BY MOUTH EVERY DAY WITH BREAKFAST 90 tablet 3  . Multiple Vitamin (MULTIVITAMIN) tablet Take 1 tablet by mouth daily.    . Omega-3 Fatty Acids (FISH OIL) 500 MG CAPS Take 1 capsule by mouth daily.    . rosuvastatin (CRESTOR) 40 MG tablet TAKE 1 TABLET BY MOUTH EVERY OTHER DAY 45 tablet 2  . triamcinolone cream (KENALOG) 0.1 % APPLY TO AFFECTED AREA TWICE A DAY 30 g 0  . [DISCONTINUED] Lansoprazole (PREVACID PO) Take by mouth daily.      . [DISCONTINUED] omeprazole (PRILOSEC) 40 MG capsule Take 1 capsule (40 mg total) by mouth daily. 180  capsule 1   No current facility-administered medications on file prior to visit.    Observations/Objective: Alert, NAD, appropriate mood and affect, resps normal, cn 2-12 intact, moves all 4s, no visible rash or swelling Lab Results  Component Value Date   WBC 4.7 04/16/2019   HGB 13.5 04/16/2019   HCT 39.7 04/16/2019   PLT 185 04/16/2019   GLUCOSE 102 (H) 04/16/2019   CHOL 247 (H) 04/16/2019   TRIG 442 (H) 04/16/2019   HDL 56 04/16/2019   LDLDIRECT 186.0 10/30/2018   [place]  04/16/2019     Comment:     . LDL cholesterol not calculated. Triglyceride levels greater than 400 mg/dL invalidate calculated LDL results. . Reference range: <100 . Desirable range <100 mg/dL for primary prevention;   <70 mg/dL for patients with CHD or diabetic patients  with > or = 2 CHD risk factors. Marland Kitchen LDL-C is now calculated using the Martin-Hopkins  calculation, which is a validated novel method providing  better accuracy than the Friedewald equation in the  estimation of LDL-C.  Cresenciano Genre et al. Annamaria Helling. 5697;948(01): 2061-2068  (http://education.[facility].com/faq/FAQ164)    ALT 26 04/16/2019   AST 20 04/16/2019   NA 141 04/16/2019   K 4.0 04/16/2019   CL 104 04/16/2019   CREATININE 1.09 04/16/2019   BUN 17 04/16/2019   CO2 26 04/16/2019   TSH 3.28 04/16/2019   PSA 0.9 04/16/2019   HGBA1C 6.0 (H) 04/16/2019   Assessment and Plan: See notes  Follow Up Instructions: See notes   I discussed the assessment and treatment plan with the patient. The patient was provided an opportunity to ask questions and all were answered. The patient agreed with the plan and demonstrated an understanding of the instructions.   The patient was advised to call back or seek an in-person evaluation if the symptoms worsen or if the condition fails to improve as anticipated.  Cathlean Cower, MD ]

## 2019-10-27 NOTE — Assessment & Plan Note (Addendum)
Fortunately with minor symptoms only, and qualifies for monoclonal ab - will refer           Starting July 8, the outpatient COVID-19 [place] will move from the [facility]) to [address] at [facility]           Hours: 8 a.m. - 2 p.m. - Tuesday, Thursday and Saturday           Patient referrals can be made through the [place] hotline number, [phone], or through [place] to the [facility] group     [facility] is offering monoclonal antibody treatment with bamlanivimab/etesevimab and casirivimab/imdevimab for patients diagnosed with COVID-19, using newly expanded clinical criteria per FDA Emergency Use Authorization. Treatment is currently available at [facility] for all expanded groups, except children ages 42-17. Treatment is available for outpatients in our community who are infected with mild to moderate COVID-19 and at risk for serious complications. Healthcare providers should consider the benefit-risk for an individual patient. Treatment must still be administered within 10 days of symptom onset.  The following medical conditions or other factors may place adults at higher risk for progression to severe COVID-19:  Older age (?63 years of age) Obesity or being overweight (adults with BMI >25 kg/m2) FinancialAct.com.ee.htm) Pregnancy Chronic kidney disease Diabetes Immunosuppressive disease or immunosuppressive treatment Cardiovascular disease (including congenital heart disease) or hypertension Chronic lung diseases (for example, chronic obstructive pulmonary disease, asthma [moderate-to-severe], interstitial lung disease, cystic fibrosis and pulmonary hypertension) Sickle cell disease Neurodevelopmental disorders (for example, cerebral palsy) or other conditions that confer medical complexity (for example, genetic or metabolic syndromes and severe congenital anomalies) Having a Biochemist, clinical dependence (for example, tracheostomy, gastrostomy, or positive pressure ventilation (not related to COVID-19)  If your practice has a patient you believe meets the criteria and could benefit from mAbs, please call the outpatient infusion center hotline at [phone] or send a message through [place] to the [facility] group. Please leave the patient's name, date of birth, phone number, and MRN number, if known. The information will be sent to our provider team, who will complete the screening process.  Staff to assist with referral  I spent 21 minutes in preparing to see the patient by review of recent labs, imaging and procedures, obtaining and reviewing separately obtained history, communicating with the patient and family or caregiver, ordering medications, tests or procedures, and documenting clinical information in the EHR including the differential Dx, treatment, and any further evaluation and other management of covid infection

## 2019-10-27 NOTE — Progress Notes (Deleted)
   Subjective:    Patient ID: Kevin Farley, male    DOB: [birth year], 64 y.o.   MRN: [id number]  HPI    Review of Systems     Objective:   Physical Exam        Assessment & Plan:

## 2019-10-28 ENCOUNTER — Encounter: Payer: Self-pay | Admitting: Oncology

## 2019-10-28 ENCOUNTER — Telehealth: Payer: Self-pay | Admitting: Oncology

## 2019-10-28 ENCOUNTER — Other Ambulatory Visit: Payer: Self-pay | Admitting: Oncology

## 2019-10-28 DIAGNOSIS — U071 COVID-19: Secondary | ICD-10-CM

## 2019-10-28 NOTE — Telephone Encounter (Signed)
I connected by phone with [name] to discuss the potential use of an new treatment for mild to moderate COVID-19 viral infection in non-hospitalized patients.   This patient is a age/sex that meets the FDA criteria for Emergency Use Authorization of casirivimab\imdevimab.  Has a (+) direct SARS-CoV-2 viral test result 1. Has mild or moderate COVID-19  2. Is ? 63 years of age and weighs ? 40 kg 3. Is NOT hospitalized due to COVID-19 4. Is NOT requiring oxygen therapy or requiring an increase in baseline oxygen flow rate due to COVID-19 5. Is within 10 days of symptom onset 6. Has at least one of the high risk factor(s) for progression to severe COVID-19 and/or hospitalization as defined in EUA. Specific high risk criteria : Past Medical History:  Diagnosis Date  . Allergic rhinitis   . Colon polyps    adenomatous 2011 & 2014  . Family history of ischemic heart disease   . GERD (gastroesophageal reflux disease)   . HTN (hypertension) 04/16/2019  . Hyperlipidemia   . Other abnormal glucose    A1c 6% in 06/2009  . Supraspinatus tendon tear   ?  ?    Symptom onset  10/21/2019   I have spoken and communicated the following to the patient or parent/caregiver:   1. FDA has authorized the emergency use of casirivimab\imdevimab for the treatment of mild to moderate COVID-19 in adults and pediatric patients with positive results of direct SARS-CoV-2 viral testing who are 26 years of age and older weighing at least 40 kg, and who are at high risk for progressing to severe COVID-19 and/or hospitalization.   2. The significant known and potential risks and benefits of casirivimab\imdevimab, and the extent to which such potential risks and benefits are unknown.   3. Information on available alternative treatments and the risks and benefits of those alternatives, including clinical trials.   4. Patients treated with casirivimab\imdevimab should continue to self-isolate and use infection control  measures (e.g., wear mask, isolate, social distance, avoid sharing personal items, clean and disinfect "high touch" surfaces, and frequent handwashing) according to CDC guidelines.    5. The patient or parent/caregiver has the option to accept or refuse casirivimab\imdevimab .   After reviewing this information with the patient, The patient agreed to proceed with receiving casirivimab\imdevimab infusion and will be provided a copy of the Fact sheet prior to receiving the infusion.Rulon Abide, AGNP-C [phone] ([place])

## 2019-10-29 ENCOUNTER — Ambulatory Visit (HOSPITAL_COMMUNITY): Payer: BC Managed Care – PPO

## 2019-11-17 ENCOUNTER — Other Ambulatory Visit: Payer: Self-pay

## 2019-11-18 ENCOUNTER — Ambulatory Visit (INDEPENDENT_AMBULATORY_CARE_PROVIDER_SITE_OTHER): Payer: BC Managed Care – PPO | Admitting: Internal Medicine

## 2019-11-18 ENCOUNTER — Encounter: Payer: Self-pay | Admitting: Internal Medicine

## 2019-11-18 VITALS — BP 144/96 | HR 98 | Temp 98.2°F | Ht 66.0 in | Wt 139.0 lb

## 2019-11-18 DIAGNOSIS — Z0000 Encounter for general adult medical examination without abnormal findings: Secondary | ICD-10-CM | POA: Diagnosis not present

## 2019-11-18 DIAGNOSIS — E559 Vitamin D deficiency, unspecified: Secondary | ICD-10-CM

## 2019-11-18 DIAGNOSIS — E782 Mixed hyperlipidemia: Secondary | ICD-10-CM

## 2019-11-18 DIAGNOSIS — I10 Essential (primary) hypertension: Secondary | ICD-10-CM

## 2019-11-18 DIAGNOSIS — R7303 Prediabetes: Secondary | ICD-10-CM

## 2019-11-18 DIAGNOSIS — E538 Deficiency of other specified B group vitamins: Secondary | ICD-10-CM | POA: Diagnosis not present

## 2019-11-18 LAB — URINALYSIS, ROUTINE W REFLEX MICROSCOPIC
Bilirubin Urine: NEGATIVE
Hgb urine dipstick: NEGATIVE
Ketones, ur: NEGATIVE
Leukocytes,Ua: NEGATIVE
Nitrite: NEGATIVE
Specific Gravity, Urine: 1.02 (ref 1.000–1.030)
Total Protein, Urine: NEGATIVE
Urine Glucose: NEGATIVE
Urobilinogen, UA: 0.2 (ref 0.0–1.0)
pH: 7 (ref 5.0–8.0)

## 2019-11-18 LAB — PSA: PSA: 1 ng/mL (ref 0.10–4.00)

## 2019-11-18 LAB — CBC WITH DIFFERENTIAL/PLATELET
Basophils Absolute: 0 10*3/uL (ref 0.0–0.1)
Basophils Relative: 0.8 % (ref 0.0–3.0)
Eosinophils Absolute: 0.1 10*3/uL (ref 0.0–0.7)
Eosinophils Relative: 1.1 % (ref 0.0–5.0)
HCT: 41.3 % (ref 39.0–52.0)
Hemoglobin: 13.8 g/dL (ref 13.0–17.0)
Lymphocytes Relative: 27.7 % (ref 12.0–46.0)
Lymphs Abs: 1.3 10*3/uL (ref 0.7–4.0)
MCHC: 33.3 g/dL (ref 30.0–36.0)
MCV: 85.1 fl (ref 78.0–100.0)
Monocytes Absolute: 0.5 10*3/uL (ref 0.1–1.0)
Monocytes Relative: 10.5 % (ref 3.0–12.0)
Neutro Abs: 2.9 10*3/uL (ref 1.4–7.7)
Neutrophils Relative %: 59.9 % (ref 43.0–77.0)
Platelets: 178 10*3/uL (ref 150.0–400.0)
RBC: 4.85 Mil/uL (ref 4.22–5.81)
RDW: 13.2 % (ref 11.5–15.5)
WBC: 4.8 10*3/uL (ref 4.0–10.5)

## 2019-11-18 LAB — BASIC METABOLIC PANEL
BUN: 16 mg/dL (ref 6–23)
CO2: 33 mEq/L — ABNORMAL HIGH (ref 19–32)
Calcium: 9.7 mg/dL (ref 8.4–10.5)
Chloride: 103 mEq/L (ref 96–112)
Creatinine, Ser: 1.06 mg/dL (ref 0.40–1.50)
GFR: 74.37 mL/min (ref >60.00)
Glucose, Bld: 106 mg/dL — ABNORMAL HIGH (ref 70–99)
Potassium: 4 mEq/L (ref 3.5–5.1)
Sodium: 141 mEq/L (ref 135–145)

## 2019-11-18 LAB — HEPATIC FUNCTION PANEL
ALT: 37 U/L (ref 0–53)
AST: 20 U/L (ref 0–37)
Albumin: 4.8 g/dL (ref 3.5–5.2)
Alkaline Phosphatase: 68 U/L (ref 39–117)
Bilirubin, Direct: 0.1 mg/dL (ref 0.0–0.3)
Total Bilirubin: 0.6 mg/dL (ref 0.2–1.2)
Total Protein: 7.7 g/dL (ref 6.0–8.3)

## 2019-11-18 LAB — LIPID PANEL
Cholesterol: 268 mg/dL — ABNORMAL HIGH (ref 0–200)
HDL: 72.6 mg/dL (ref >39.00)
NonHDL: 195.27
Total CHOL/HDL Ratio: 4
Triglycerides: 237 mg/dL — ABNORMAL HIGH (ref 0.0–149.0)
VLDL: 47.4 mg/dL — ABNORMAL HIGH (ref 0.0–40.0)

## 2019-11-18 LAB — VITAMIN D 25 HYDROXY (VIT D DEFICIENCY, FRACTURES): VITD: 34.74 ng/mL (ref 30.00–100.00)

## 2019-11-18 LAB — VITAMIN B12: Vitamin B-12: 599 pg/mL (ref 211–911)

## 2019-11-18 LAB — TSH: TSH: 3.04 u[IU]/mL (ref 0.35–4.50)

## 2019-11-18 LAB — LDL CHOLESTEROL, DIRECT: Direct LDL: 150 mg/dL

## 2019-11-18 LAB — HEMOGLOBIN A1C: Hgb A1c MFr Bld: 6.2 % (ref 4.6–6.5)

## 2019-11-18 NOTE — Progress Notes (Signed)
Subjective:    Patient ID: Kevin Farley, male    DOB: [birth year], 64 y.o.   MRN: [id number]  HPI  Here for wellness and f/u;  Overall doing ok;  Pt denies Chest pain, worsening SOB, DOE, wheezing, orthopnea, PND, worsening LE edema, palpitations, dizziness or syncope.  Pt denies neurological change such as new headache, facial or extremity weakness.  Pt denies polydipsia, polyuria, or low sugar symptoms. Pt states overall good compliance with treatment and medications, good tolerability, and has been trying to follow appropriate diet.  Pt denies worsening depressive symptoms, suicidal ideation or panic. No fever, night sweats, wt loss, loss of appetite, or other constitutional symptoms.  Pt states good ability with ADL's, has low fall risk, home safety reviewed and adequate, no other significant changes in hearing or vision, and only occasionally active with exercise. Taking the statin, but not taking the losartan or metformin.  Has not check bP at home recently Past Medical History:  Diagnosis Date  . Allergic rhinitis   . Colon polyps    adenomatous 2011 & 2014  . Family history of ischemic heart disease   . GERD (gastroesophageal reflux disease)   . HTN (hypertension) 04/16/2019  . Hyperlipidemia   . Other abnormal glucose    A1c 6% in 06/2009  . Supraspinatus tendon tear    Past Surgical History:  Procedure Laterality Date  . BACK SURGERY  2006   Dr Carloyn Manner, [place]  . cardic catherization  2005   negative; Dr Lyla Son  . colon polyps  2011 & 2014   Dr.Jacobs   . CORNEAL TRANSPLANT Left 1993   Dr Kathrin Penner  . nasal bone surgery  2007  . UPPER GASTROINTESTINAL ENDOSCOPY  2011   mild gastritis; Dr Ardis Hughs    reports that he has never smoked. He has never used smokeless tobacco. He reports current alcohol use of about 1.0 standard drink of alcohol per week. He reports that he does not use drugs. family history includes Diabetes in his brother, brother, father, mother, and sister; Heart attack  in his brother and father; Hypertension in his brother, father, mother, and sister; Kidney failure in his brother and brother; Stroke in his mother; Transient ischemic attack in his brother. Allergies  Allergen Reactions  . Aspirin     REACTION: hives States tolerates IBU & acetaminophen OK  . Other     Walnuts & pecans cause hives  . Codeine     Itching with codeine containing cough syrup 9/15  . Zetia [Ezetimibe]     See 09/22/14 musculoskeletal pains  . Ezetimibe-Simvastatin Hives    REACTION: myalgia   Current Outpatient Medications on File Prior to Visit  Medication Sig Dispense Refill  . Coenzyme Q10 (CO Q-10) 100 MG CAPS Take 1 capsule by mouth daily.    Marland Kitchen DEXILANT 60 MG capsule TAKE 1 CAPSULE BY MOUTH EVERY DAY 90 capsule 3  . Flaxseed, Linseed, (FLAX SEED OIL PO) Take by mouth.    . fluticasone (FLONASE) 50 MCG/ACT nasal spray SPRAY 2 SPRAYS INTO EACH NOSTRIL DAILY 48 mL 3  . hydrocortisone (ANUSOL-HC) 2.5 % rectal cream PLACE 1 APPLICATION RECTALLY 2 (TWO) TIMES DAILY. 30 g 2  . hydrocortisone cream 1 % Apply 1 application topically 2 (two) times daily. 30 g 0  . LORazepam (ATIVAN) 0.5 MG tablet TAKE 1 TABLET BY MOUTH EVERY 8 TO 12 HOURS AS NEEDED 30 tablet 2  . Multiple Vitamin (MULTIVITAMIN) tablet Take 1 tablet by mouth daily.    Marland Kitchen  Omega-3 Fatty Acids (FISH OIL) 500 MG CAPS Take 1 capsule by mouth daily.    . rosuvastatin (CRESTOR) 40 MG tablet TAKE 1 TABLET BY MOUTH EVERY OTHER DAY 45 tablet 2  . triamcinolone cream (KENALOG) 0.1 % APPLY TO AFFECTED AREA TWICE A DAY 30 g 0  . acyclovir (ZOVIRAX) 400 MG tablet Take 400 mg by mouth daily.    Marland Kitchen Epinastine HCl 0.05 % ophthalmic solution SMARTSIG:1 Drop(s) In Eye(s) Twice Daily PRN    . [DISCONTINUED] Lansoprazole (PREVACID PO) Take by mouth daily.      . [DISCONTINUED] omeprazole (PRILOSEC) 40 MG capsule Take 1 capsule (40 mg total) by mouth daily. 180 capsule 1   No current facility-administered medications on file prior to  visit.   Review of Systems All otherwise neg per pt    Objective:   Physical Exam BP (!) 144/96 (BP Location: Left Arm, Patient Position: Sitting, Cuff Size: Large)   Pulse 98   Temp 98.2 F (36.8 C) (Oral)   Ht 5\' 6"  (1.676 m)   Wt 139 lb (63 kg)   SpO2 97%   BMI 22.44 kg/m  VS noted,  Constitutional: Pt appears in NAD HENT: Head: NCAT.  Right Ear: External ear normal.  Left Ear: External ear normal.  Eyes: . Pupils are equal, round, and reactive to light. Conjunctivae and EOM are normal Nose: without d/c or deformity Neck: Neck supple. Gross normal ROM Cardiovascular: Normal rate and regular rhythm.   Pulmonary/Chest: Effort normal and breath sounds without rales or wheezing.  Abd:  Soft, NT, ND, + BS, no organomegaly Neurological: Pt is alert. At baseline orientation, motor grossly intact Skin: Skin is warm. No rashes, other new lesions, no LE edema Psychiatric: Pt behavior is normal without agitation  All otherwise neg per pt Lab Results  Component Value Date   WBC 4.8 11/18/2019   HGB 13.8 11/18/2019   HCT 41.3 11/18/2019   PLT 178.0 11/18/2019   GLUCOSE 106 (H) 11/18/2019   CHOL 268 (H) 11/18/2019   TRIG 237.0 (H) 11/18/2019   HDL 72.60 11/18/2019   LDLDIRECT 150.0 11/18/2019   Laureles  04/16/2019     Comment:     . LDL cholesterol not calculated. Triglyceride levels greater than 400 mg/dL invalidate calculated LDL results. . Reference range: <100 . Desirable range <100 mg/dL for primary prevention;   <70 mg/dL for patients with CHD or diabetic patients  with > or = 2 CHD risk factors. Marland Kitchen LDL-C is now calculated using the Martin-Hopkins  calculation, which is a validated novel method providing  better accuracy than the Friedewald equation in the  estimation of LDL-C.  Cresenciano Genre et al. Annamaria Helling. 2703;500(93): 2061-2068  (http://education.[facility].com/faq/FAQ164)    ALT 37 11/18/2019   AST 20 11/18/2019   NA 141 11/18/2019   K 4.0 11/18/2019    CL 103 11/18/2019   CREATININE 1.06 11/18/2019   BUN 16 11/18/2019   CO2 33 (H) 11/18/2019   TSH 3.04 11/18/2019   PSA 1.00 11/18/2019   HGBA1C 6.2 11/18/2019      Assessment & Plan:

## 2019-11-18 NOTE — Patient Instructions (Addendum)
Please remember to have your Booster shot soon  Sometime after that, you should have the flu shot  Please remember to check your BP at home, with a goal being to be less than 140/90 (or even 130/80); so if you could, check your BP every day for 10 days, and call if the number is high  If it is high, we can consider starting the losartan 50 mg  Please continue all other medications as before, and refills have been done if requested.  Please have the pharmacy call with any other refills you may need.  Please continue your efforts at being more active, low cholesterol diet, and weight control.  You are otherwise up to date with prevention measures today.  Please keep your appointments with your specialists as you may have planned  Please go to the LAB at the blood drawing area for the tests to be done  You will be contacted by phone if any changes need to be made immediately.  Otherwise, you will receive a letter about your results with an explanation, but please check with MyChart first.  Please remember to sign up for MyChart if you have not done so, as this will be important to you in the future with finding out test results, communicating by private email, and scheduling acute appointments online when needed.  Please make an Appointment to return for your 1 year visit, or sooner if needed, with Lab testing by Appointment as well, to be done about 3-5 days before at the [facility] (so this is for TWO appointments - please see the scheduling desk as you leave)

## 2019-11-21 ENCOUNTER — Encounter: Payer: Self-pay | Admitting: Internal Medicine

## 2019-11-21 NOTE — Assessment & Plan Note (Signed)
?   Control, for check bp at home qd for 10 days and let us know average

## 2019-11-21 NOTE — Assessment & Plan Note (Signed)
stable overall by history and exam, recent data reviewed with pt, and pt to continue medical treatment as before,  to f/u any worsening symptoms or concerns  

## 2019-11-21 NOTE — Assessment & Plan Note (Signed)

## 2019-11-21 NOTE — Assessment & Plan Note (Signed)
For vit d 2000 u qd 

## 2020-01-26 DIAGNOSIS — H52202 Unspecified astigmatism, left eye: Secondary | ICD-10-CM | POA: Diagnosis not present

## 2020-01-26 DIAGNOSIS — Z947 Corneal transplant status: Secondary | ICD-10-CM | POA: Diagnosis not present

## 2020-01-26 DIAGNOSIS — H43811 Vitreous degeneration, right eye: Secondary | ICD-10-CM | POA: Diagnosis not present

## 2020-01-26 DIAGNOSIS — E119 Type 2 diabetes mellitus without complications: Secondary | ICD-10-CM | POA: Diagnosis not present

## 2020-01-26 LAB — HM DIABETES EYE EXAM

## 2020-01-31 ENCOUNTER — Encounter: Payer: Self-pay | Admitting: Internal Medicine

## 2020-02-08 ENCOUNTER — Encounter: Payer: Self-pay | Admitting: Internal Medicine

## 2020-04-04 ENCOUNTER — Other Ambulatory Visit: Payer: Self-pay | Admitting: Internal Medicine

## 2020-05-28 ENCOUNTER — Other Ambulatory Visit: Payer: Self-pay | Admitting: Internal Medicine

## 2020-05-28 NOTE — Telephone Encounter (Signed)
Please refill as per office routine med refill policy (all routine meds refilled for 3 mo or monthly per pt preference up to one year from last visit, then month to month grace period for 3 mo, then further med refills will have to be denied)  

## 2020-06-07 ENCOUNTER — Other Ambulatory Visit: Payer: Self-pay

## 2020-06-08 ENCOUNTER — Encounter: Payer: Self-pay | Admitting: Internal Medicine

## 2020-06-08 ENCOUNTER — Ambulatory Visit: Payer: BC Managed Care – PPO | Admitting: Internal Medicine

## 2020-06-08 VITALS — BP 150/90 | HR 68 | Temp 98.2°F | Ht 66.0 in | Wt 141.0 lb

## 2020-06-08 DIAGNOSIS — E782 Mixed hyperlipidemia: Secondary | ICD-10-CM | POA: Diagnosis not present

## 2020-06-08 DIAGNOSIS — I10 Essential (primary) hypertension: Secondary | ICD-10-CM

## 2020-06-08 DIAGNOSIS — R7303 Prediabetes: Secondary | ICD-10-CM | POA: Diagnosis not present

## 2020-06-08 DIAGNOSIS — E559 Vitamin D deficiency, unspecified: Secondary | ICD-10-CM | POA: Diagnosis not present

## 2020-06-08 LAB — BASIC METABOLIC PANEL
BUN: 15 mg/dL (ref 6–23)
CO2: 31 mEq/L (ref 19–32)
Calcium: 9.5 mg/dL (ref 8.4–10.5)
Chloride: 103 mEq/L (ref 96–112)
Creatinine, Ser: 1.03 mg/dL (ref 0.40–1.50)
GFR: 77.33 mL/min (ref >60.00)
Glucose, Bld: 106 mg/dL — ABNORMAL HIGH (ref 70–99)
Potassium: 4.3 mEq/L (ref 3.5–5.1)
Sodium: 140 mEq/L (ref 135–145)

## 2020-06-08 LAB — LIPID PANEL
Cholesterol: 220 mg/dL — ABNORMAL HIGH (ref 0–200)
HDL: 60.6 mg/dL (ref >39.00)
LDL Cholesterol: 129 mg/dL — ABNORMAL HIGH (ref 0–99)
NonHDL: 159.6
Total CHOL/HDL Ratio: 4
Triglycerides: 155 mg/dL — ABNORMAL HIGH (ref 0.0–149.0)
VLDL: 31 mg/dL (ref 0.0–40.0)

## 2020-06-08 LAB — HEMOGLOBIN A1C: Hgb A1c MFr Bld: 6.2 % (ref 4.6–6.5)

## 2020-06-08 LAB — HEPATIC FUNCTION PANEL
ALT: 25 U/L (ref 0–53)
AST: 20 U/L (ref 0–37)
Albumin: 4.5 g/dL (ref 3.5–5.2)
Alkaline Phosphatase: 51 U/L (ref 39–117)
Bilirubin, Direct: 0.1 mg/dL (ref 0.0–0.3)
Total Bilirubin: 0.5 mg/dL (ref 0.2–1.2)
Total Protein: 7.6 g/dL (ref 6.0–8.3)

## 2020-06-08 MED ORDER — LOSARTAN POTASSIUM 50 MG PO TABS: 50.0000 mg | ORAL_TABLET | Freq: Every day | ORAL | 3 refills | Status: DC

## 2020-06-08 MED ORDER — THERA-D 2000 50 MCG (2000 UT) PO TABS: ORAL_TABLET | ORAL | 99 refills | Status: DC

## 2020-06-08 NOTE — Progress Notes (Signed)
Patient ID: Kevin Farley, male   DOB: March 22, 1955, 65 y.o.   MRN: [id number]        Chief Complaint: follow up HTN, HLD and hyperglycemia and low vit d       HPI:  Kevin Farley is a 65 y.o. male here overall doing well, not taking VIt D,  Pt denies polydipsia, polyuria, or new focal neuro s/s.  Pt denies chest pain, increased sob or doe, wheezing, orthopnea, PND, increased LE swelling, palpitations, dizziness or syncope.  Now taking crestor 40 qod due to myalgias but tolerating this ok.   Pt denies fever, wt loss, night sweats, loss of appetite, or other constitutional symptoms  No other new complaints         Wt Readings from Last 3 Encounters:  06/08/20 141 lb (64 kg)  11/18/19 139 lb (63 kg)  08/19/19 145 lb (65.8 kg)   BP Readings from Last 3 Encounters:  06/08/20 (!) 150/90  11/18/19 (!) 144/96  08/19/19 (!) 138/102         Past Medical History:  Diagnosis Date  . Allergic rhinitis   . Colon polyps    adenomatous 2011 & 2014  . Family history of ischemic heart disease   . GERD (gastroesophageal reflux disease)   . HTN (hypertension) 04/16/2019  . Hyperlipidemia   . Other abnormal glucose    A1c 6% in 06/2009  . Supraspinatus tendon tear    Past Surgical History:  Procedure Laterality Date  . BACK SURGERY  2006   Dr Carloyn Manner, [place]  . cardic catherization  2005   negative; Dr Lyla Son  . colon polyps  2011 & 2014   Dr.Jacobs   . CORNEAL TRANSPLANT Left 1993   Dr Kathrin Penner  . nasal bone surgery  2007  . UPPER GASTROINTESTINAL ENDOSCOPY  2011   mild gastritis; Dr Ardis Hughs    reports that he has never smoked. He has never used smokeless tobacco. He reports current alcohol use of about 1.0 standard drink of alcohol per week. He reports that he does not use drugs. family history includes Diabetes in his brother, brother, father, mother, and sister; Heart attack in his brother and father; Hypertension in his brother, father, mother, and sister; Kidney failure in his brother and brother;  Stroke in his mother; Transient ischemic attack in his brother. Allergies  Allergen Reactions  . Aspirin     REACTION: hives States tolerates IBU & acetaminophen OK  . Other     Walnuts & pecans cause hives  . Codeine     Itching with codeine containing cough syrup 9/15  . Zetia [Ezetimibe]     See 09/22/14 musculoskeletal pains  . Ezetimibe-Simvastatin Hives    REACTION: myalgia   Current Outpatient Medications on File Prior to Visit  Medication Sig Dispense Refill  . acyclovir (ZOVIRAX) 400 MG tablet Take 400 mg by mouth daily.    . Coenzyme Q10 (CO Q-10) 100 MG CAPS Take 1 capsule by mouth daily.    Marland Kitchen DEXILANT 60 MG capsule TAKE 1 CAPSULE BY MOUTH EVERY DAY 90 capsule 3  . Epinastine HCl 0.05 % ophthalmic solution SMARTSIG:1 Drop(s) In Eye(s) Twice Daily PRN    . Flaxseed, Linseed, (FLAX SEED OIL PO) Take by mouth.    . fluticasone (FLONASE) 50 MCG/ACT nasal spray SPRAY 2 SPRAYS INTO EACH NOSTRIL DAILY 48 mL 3  . hydrocortisone (ANUSOL-HC) 2.5 % rectal cream PLACE 1 APPLICATION RECTALLY 2 (TWO) TIMES DAILY. 30 g 2  .  hydrocortisone cream 1 % Apply 1 application topically 2 (two) times daily. 30 g 0  . LORazepam (ATIVAN) 0.5 MG tablet TAKE 1 TABLET BY MOUTH EVERY 8 TO 12 HOURS AS NEEDED 30 tablet 2  . Multiple Vitamin (MULTIVITAMIN) tablet Take 1 tablet by mouth daily.    . Omega-3 Fatty Acids (FISH OIL) 500 MG CAPS Take 1 capsule by mouth daily.    . rosuvastatin (CRESTOR) 40 MG tablet TAKE 1 TABLET BY MOUTH EVERY OTHER DAY 45 tablet 0  . triamcinolone cream (KENALOG) 0.1 % APPLY TO AFFECTED AREA TWICE A DAY 30 g 0  . [DISCONTINUED] Lansoprazole (PREVACID PO) Take by mouth daily.      . [DISCONTINUED] omeprazole (PRILOSEC) 40 MG capsule Take 1 capsule (40 mg total) by mouth daily. 180 capsule 1   No current facility-administered medications on file prior to visit.        ROS:  All others reviewed and negative.  Objective        PE:  BP (!) 150/90 (BP Location: Left Arm,  Patient Position: Sitting, Cuff Size: Normal)   Pulse 68   Temp 98.2 F (36.8 C) (Oral)   Ht 5\' 6"  (1.676 m)   Wt 141 lb (64 kg)   SpO2 99%   BMI 22.76 kg/m                 Constitutional: Pt appears in NAD               HENT: Head: NCAT.                Right Ear: External ear normal.                 Left Ear: External ear normal.                Eyes: . Pupils are equal, round, and reactive to light. Conjunctivae and EOM are normal               Nose: without d/c or deformity               Neck: Neck supple. Gross normal ROM               Cardiovascular: Normal rate and regular rhythm.                 Pulmonary/Chest: Effort normal and breath sounds without rales or wheezing.                Abd:  Soft, NT, ND, + BS, no organomegaly               Neurological: Pt is alert. At baseline orientation, motor grossly intact               Skin: Skin is warm. No rashes, no other new lesions, LE edema - none               Psychiatric: Pt behavior is normal without agitation   Micro: none  Cardiac tracings I have personally interpreted today:  none  Pertinent Radiological findings (summarize): none   Lab Results  Component Value Date   WBC 4.8 11/18/2019   HGB 13.8 11/18/2019   HCT 41.3 11/18/2019   PLT 178.0 11/18/2019   GLUCOSE 106 (H) 06/08/2020   CHOL 220 (H) 06/08/2020   TRIG 155.0 (H) 06/08/2020   HDL 60.60 06/08/2020   LDLDIRECT 150.0 11/18/2019   LDLCALC 129 (H) 06/08/2020   ALT 25  06/08/2020   AST 20 06/08/2020   NA 140 06/08/2020   K 4.3 06/08/2020   CL 103 06/08/2020   CREATININE 1.03 06/08/2020   BUN 15 06/08/2020   CO2 31 06/08/2020   TSH 3.04 11/18/2019   PSA 1.00 11/18/2019   HGBA1C 6.2 06/08/2020   Assessment/Plan:  Kevin Farley is a 65 y.o. Other or two or more races [6] male with  has a past medical history of Allergic rhinitis, Colon polyps, Family history of ischemic heart disease, GERD (gastroesophageal reflux disease), HTN (hypertension) (04/16/2019),  Hyperlipidemia, Other abnormal glucose, and Supraspinatus tendon tear.  Vitamin D deficiency Last vitamin D Lab Results  Component Value Date   VD25OH 34.74 11/18/2019   Low normal, to start oral replacement - 2000 u qd  Pre-diabetes Lab Results  Component Value Date   HGBA1C 6.2 06/08/2020   Stable, pt to continue current medical treatment  - diet, wt control   Hyperlipidemia Tolerating crestor 40 qod,  Lab Results  Component Value Date   LDLCALC 129 (H) 06/08/2020   Stable, pt to continue current statin qod,  to f/u any worsening symptoms or concerns; also for cardiac ct score   HTN (hypertension) BP Readings from Last 3 Encounters:  06/08/20 (!) 150/90  11/18/19 (!) 144/96  08/19/19 (!) 138/102   Stable, pt to add losartan 50 qd for uncontrolled pressure, f/u BP at home and next visit    Current Outpatient Medications (Cardiovascular):  .  losartan (COZAAR) 50 MG tablet, Take 1 tablet (50 mg total) by mouth daily. .  rosuvastatin (CRESTOR) 40 MG tablet, TAKE 1 TABLET BY MOUTH EVERY OTHER DAY  Current Outpatient Medications (Respiratory):  .  fluticasone (FLONASE) 50 MCG/ACT nasal spray, SPRAY 2 SPRAYS INTO EACH NOSTRIL DAILY    Current Outpatient Medications (Other):  .  acyclovir (ZOVIRAX) 400 MG tablet, Take 400 mg by mouth daily. .  Cholecalciferol (THERA-D 2000) 50 MCG (2000 UT) TABS, 1 tab by mouth once daily .  Coenzyme Q10 (CO Q-10) 100 MG CAPS, Take 1 capsule by mouth daily. Marland Kitchen  DEXILANT 60 MG capsule, TAKE 1 CAPSULE BY MOUTH EVERY DAY .  Epinastine HCl 0.05 % ophthalmic solution, SMARTSIG:1 Drop(s) In Eye(s) Twice Daily PRN .  Flaxseed, Linseed, (FLAX SEED OIL PO), Take by mouth. .  hydrocortisone (ANUSOL-HC) 2.5 % rectal cream, PLACE 1 APPLICATION RECTALLY 2 (TWO) TIMES DAILY. .  hydrocortisone cream 1 %, Apply 1 application topically 2 (two) times daily. Marland Kitchen  LORazepam (ATIVAN) 0.5 MG tablet, TAKE 1 TABLET BY MOUTH EVERY 8 TO 12 HOURS AS NEEDED .   Multiple Vitamin (MULTIVITAMIN) tablet, Take 1 tablet by mouth daily. .  Omega-3 Fatty Acids (FISH OIL) 500 MG CAPS, Take 1 capsule by mouth daily. Marland Kitchen  triamcinolone cream (KENALOG) 0.1 %, APPLY TO AFFECTED AREA TWICE A DAY   Followup: Return in about 6 months (around 12/08/2020).  Cathlean Cower, MD 06/11/2020 1:23 AM [facility]

## 2020-06-08 NOTE — Patient Instructions (Addendum)
We have discussed the Cardiac CT Score test to measure the calcification level (if any) in your heart arteries.  This test has been ordered in our Onset, so please call [facility] directly, as they prefer this, at [phone] to be scheduled.  Please take all new medication as prescribed - the losartan 50 mg per day for blood pressure  Please take OTC Vitamin D3 at 2000 units per day, indefinitely.  Please continue all other medications as before, and refills have been done if requested.  Please have the pharmacy call with any other refills you may need.  Please continue your efforts at being more active, low cholesterol diet, and weight control.  Please keep your appointments with your specialists as you may have planned  Please go to the LAB at the blood drawing area for the tests to be done  You will be contacted by phone if any changes need to be made immediately.  Otherwise, you will receive a letter about your results with an explanation, but please check with MyChart first.  Please remember to sign up for MyChart if you have not done so, as this will be important to you in the future with finding out test results, communicating by private email, and scheduling acute appointments online when needed.  Please make an Appointment to return in 6 months, or sooner if needed

## 2020-06-11 ENCOUNTER — Encounter: Payer: Self-pay | Admitting: Internal Medicine

## 2020-06-11 NOTE — Assessment & Plan Note (Signed)
Lab Results  Component Value Date   HGBA1C 6.2 06/08/2020   Stable, pt to continue current medical treatment  - diet, wt control

## 2020-06-11 NOTE — Assessment & Plan Note (Signed)
BP Readings from Last 3 Encounters:  06/08/20 (!) 150/90  11/18/19 (!) 144/96  08/19/19 (!) 138/102   Stable, pt to add losartan 50 qd for uncontrolled pressure, f/u BP at home and next visit    Current Outpatient Medications (Cardiovascular):  .  losartan (COZAAR) 50 MG tablet, Take 1 tablet (50 mg total) by mouth daily. .  rosuvastatin (CRESTOR) 40 MG tablet, TAKE 1 TABLET BY MOUTH EVERY OTHER DAY  Current Outpatient Medications (Respiratory):  .  fluticasone (FLONASE) 50 MCG/ACT nasal spray, SPRAY 2 SPRAYS INTO EACH NOSTRIL DAILY    Current Outpatient Medications (Other):  .  acyclovir (ZOVIRAX) 400 MG tablet, Take 400 mg by mouth daily. .  Cholecalciferol (THERA-D 2000) 50 MCG (2000 UT) TABS, 1 tab by mouth once daily .  Coenzyme Q10 (CO Q-10) 100 MG CAPS, Take 1 capsule by mouth daily. Marland Kitchen  DEXILANT 60 MG capsule, TAKE 1 CAPSULE BY MOUTH EVERY DAY .  Epinastine HCl 0.05 % ophthalmic solution, SMARTSIG:1 Drop(s) In Eye(s) Twice Daily PRN .  Flaxseed, Linseed, (FLAX SEED OIL PO), Take by mouth. .  hydrocortisone (ANUSOL-HC) 2.5 % rectal cream, PLACE 1 APPLICATION RECTALLY 2 (TWO) TIMES DAILY. .  hydrocortisone cream 1 %, Apply 1 application topically 2 (two) times daily. Marland Kitchen  LORazepam (ATIVAN) 0.5 MG tablet, TAKE 1 TABLET BY MOUTH EVERY 8 TO 12 HOURS AS NEEDED .  Multiple Vitamin (MULTIVITAMIN) tablet, Take 1 tablet by mouth daily. .  Omega-3 Fatty Acids (FISH OIL) 500 MG CAPS, Take 1 capsule by mouth daily. Marland Kitchen  triamcinolone cream (KENALOG) 0.1 %, APPLY TO AFFECTED AREA TWICE A DAY

## 2020-06-11 NOTE — Assessment & Plan Note (Signed)
Last vitamin D Lab Results  Component Value Date   VD25OH 34.74 11/18/2019   Low normal, to start oral replacement - 2000 u qd

## 2020-06-11 NOTE — Assessment & Plan Note (Addendum)
Tolerating crestor 40 qod,  Lab Results  Component Value Date   LDLCALC 129 (H) 06/08/2020   Stable, pt to continue current statin qod,  to f/u any worsening symptoms or concerns; also for cardiac ct score

## 2020-08-27 ENCOUNTER — Other Ambulatory Visit: Payer: Self-pay | Admitting: Internal Medicine

## 2020-08-27 NOTE — Telephone Encounter (Signed)
Please refill as per office routine med refill policy (all routine meds refilled for 3 mo or monthly per pt preference up to one year from last visit, then month to month grace period for 3 mo, then further med refills will have to be denied)  

## 2020-09-05 ENCOUNTER — Other Ambulatory Visit: Payer: Self-pay | Admitting: Internal Medicine

## 2020-10-23 ENCOUNTER — Other Ambulatory Visit: Payer: Self-pay | Admitting: Internal Medicine

## 2020-10-23 NOTE — Telephone Encounter (Signed)
Please refill as per office routine med refill policy (all routine meds to be refilled for 3 mo or monthly (per pt preference) up to one year from last visit, then month to month grace period for 3 mo, then further med refills will have to be denied) ? ?

## 2020-10-25 ENCOUNTER — Other Ambulatory Visit: Payer: Self-pay | Admitting: Internal Medicine

## 2020-10-26 MED ORDER — PANTOPRAZOLE SODIUM 40 MG PO TBEC: 40.0000 mg | DELAYED_RELEASE_TABLET | Freq: Every day | ORAL | 3 refills | Status: DC

## 2020-10-26 NOTE — Addendum Note (Signed)
Addended by: Biagio Borg on: 10/26/2020 08:03 AM   Modules accepted: Orders

## 2020-10-26 NOTE — Telephone Encounter (Signed)
Pleaes to let pt know  Since insurance will not cover dexilant, this was changed to generic protonix

## 2020-10-28 ENCOUNTER — Telehealth: Payer: Self-pay

## 2020-10-31 ENCOUNTER — Other Ambulatory Visit: Payer: Self-pay | Admitting: Internal Medicine

## 2020-10-31 NOTE — Telephone Encounter (Signed)
PA submitted to plan

## 2020-11-01 ENCOUNTER — Other Ambulatory Visit: Payer: Self-pay | Admitting: Internal Medicine

## 2020-11-02 NOTE — Telephone Encounter (Signed)
Left message for patient to call me back. 

## 2020-11-02 NOTE — Telephone Encounter (Signed)
Patient is requesting PA be submitted for DEXILANT 60 MG capsule & not the pantoprazole  Also wants to know why rx for Merformin 500mg  was not refilled & is no longer showing on med list

## 2020-11-02 NOTE — Telephone Encounter (Signed)
Spoke with patient's wife; per chart Metformin was discontinued by provider.  Dexilant PA is being worked on

## 2020-11-03 NOTE — Telephone Encounter (Signed)
PA submitted to plan and awaiting response

## 2020-11-09 ENCOUNTER — Ambulatory Visit: Payer: BC Managed Care – PPO | Admitting: Internal Medicine

## 2020-11-09 ENCOUNTER — Encounter: Payer: Self-pay | Admitting: Internal Medicine

## 2020-11-09 ENCOUNTER — Other Ambulatory Visit: Payer: Self-pay

## 2020-11-09 VITALS — BP 138/80 | HR 72 | Temp 98.2°F | Ht 66.0 in | Wt 142.6 lb

## 2020-11-09 DIAGNOSIS — K219 Gastro-esophageal reflux disease without esophagitis: Secondary | ICD-10-CM | POA: Diagnosis not present

## 2020-11-09 DIAGNOSIS — E559 Vitamin D deficiency, unspecified: Secondary | ICD-10-CM | POA: Diagnosis not present

## 2020-11-09 DIAGNOSIS — R7303 Prediabetes: Secondary | ICD-10-CM | POA: Diagnosis not present

## 2020-11-09 DIAGNOSIS — I10 Essential (primary) hypertension: Secondary | ICD-10-CM

## 2020-11-09 DIAGNOSIS — Z23 Encounter for immunization: Secondary | ICD-10-CM

## 2020-11-09 DIAGNOSIS — E782 Mixed hyperlipidemia: Secondary | ICD-10-CM

## 2020-11-09 DIAGNOSIS — Z0000 Encounter for general adult medical examination without abnormal findings: Secondary | ICD-10-CM

## 2020-11-09 DIAGNOSIS — E538 Deficiency of other specified B group vitamins: Secondary | ICD-10-CM

## 2020-11-09 MED ORDER — FAMOTIDINE 20 MG PO TABS
20.0000 mg | ORAL_TABLET | Freq: Two times a day (BID) | ORAL | 11 refills | Status: DC
Start: 2020-11-09 — End: 2021-06-27

## 2020-11-09 NOTE — Patient Instructions (Signed)
You had the flu shot today  Please take all new medication as prescribed - the pepcid 20 mg twice per day  If this works ok, you can stop the dexilant  Please continue all other medications as before, and refills have been done if requested.  Please have the pharmacy call with any other refills you may need.  Please continue your efforts at being more active, low cholesterol diet, and weight control  Please keep your appointments with your specialists as you may have planned  You will be contacted regarding the referral for: Nutrition  Please make an Appointment to return in Nov 2022, or sooner if needed, also with Labs done at the [facility] a few days ahead

## 2020-11-09 NOTE — Assessment & Plan Note (Signed)
Pt no longer wishes to take dexilant due to wariness of potential side effects, so cange to pepcid 20 bid, and refer nutrition

## 2020-11-09 NOTE — Assessment & Plan Note (Signed)
Lab Results  Component Value Date   HGBA1C 6.2 06/08/2020   Stable, pt to continue current medical treatment  - diet - for dietary referral

## 2020-11-09 NOTE — Assessment & Plan Note (Signed)
Last vitamin D Lab Results  Component Value Date   VD25OH 34.74 11/18/2019   Low, to start oral replacement

## 2020-11-09 NOTE — Assessment & Plan Note (Signed)
Lab Results  Component Value Date   LDLCALC 129 (H) 06/08/2020   Tolerating crestor well,, pt to continue current crestor , refer nutrition and f/u labs with CPX next month as planned

## 2020-11-09 NOTE — Assessment & Plan Note (Signed)
BP Readings from Last 3 Encounters:  11/09/20 138/80  06/08/20 (!) 150/90  11/18/19 (!) 144/96   improved and Stable, pt to continue medical treatment losartan

## 2020-11-09 NOTE — Progress Notes (Signed)
Patient ID: Kevin Farley, male   DOB: Nov 01, 1955, 65 y.o.   MRN: [id number]        Chief Complaint: follow up reflux, pre DM and HLD       HPI:  Kevin Farley is a 65 y.o. male here overall doing ok, but has become wary of dexilant after reading about potential side effects, so currently only taking qod, but in doing this has frequent breakthrough reflux on the off days.  Denies worsening abd pain, dysphagia, n/v, bowel change or blood.  Pt denies chest pain, increased sob or doe, wheezing, orthopnea, PND, increased LE swelling, palpitations, dizziness or syncope.   Pt denies polydipsia, polyuria, or new focal neuro s/s,  tolerating Crestor well, asks for nutrition referral       Wt Readings from Last 3 Encounters:  11/09/20 142 lb 9.6 oz (64.7 kg)  06/08/20 141 lb (64 kg)  11/18/19 139 lb (63 kg)   BP Readings from Last 3 Encounters:  11/09/20 138/80  06/08/20 (!) 150/90  11/18/19 (!) 144/96         Past Medical History:  Diagnosis Date   Allergic rhinitis    Colon polyps    adenomatous 2011 & 2014   Family history of ischemic heart disease    GERD (gastroesophageal reflux disease)    HTN (hypertension) 04/16/2019   Hyperlipidemia    Other abnormal glucose    A1c 6% in 06/2009   Supraspinatus tendon tear    Past Surgical History:  Procedure Laterality Date   BACK SURGERY  2006   Dr Carloyn Manner, [place]   cardic catherization  2005   negative; Dr Lyla Son   colon polyps  2011 & 2014   Dr.Jacobs    CORNEAL TRANSPLANT Left 1993   Dr Kathrin Penner   nasal bone surgery  2007   UPPER GASTROINTESTINAL ENDOSCOPY  2011   mild gastritis; Dr Ardis Hughs    reports that he has never smoked. He has never used smokeless tobacco. He reports current alcohol use of about 1.0 standard drink per week. He reports that he does not use drugs. family history includes Diabetes in his brother, brother, father, mother, and sister; Heart attack in his brother and father; Hypertension in his brother, father, mother, and  sister; Kidney failure in his brother and brother; Stroke in his mother; Transient ischemic attack in his brother. Allergies  Allergen Reactions   Aspirin     REACTION: hives States tolerates IBU & acetaminophen OK   Other     Walnuts & pecans cause hives   Codeine     Itching with codeine containing cough syrup 9/15   Zetia [Ezetimibe]     See 09/22/14 musculoskeletal pains   Ezetimibe-Simvastatin Hives    REACTION: myalgia   Current Outpatient Medications on File Prior to Visit  Medication Sig Dispense Refill   acyclovir (ZOVIRAX) 400 MG tablet Take 400 mg by mouth daily.     Cholecalciferol (THERA-D 2000) 50 MCG (2000 UT) TABS 1 tab by mouth once daily 30 tablet 99   Coenzyme Q10 (CO Q-10) 100 MG CAPS Take 1 capsule by mouth daily.     DEXILANT 60 MG capsule TAKE 1 CAPSULE BY MOUTH EVERY DAY 90 capsule 3   Epinastine HCl 0.05 % ophthalmic solution SMARTSIG:1 Drop(s) In Eye(s) Twice Daily PRN     Flaxseed, Linseed, (FLAX SEED OIL PO) Take by mouth.     fluticasone (FLONASE) 50 MCG/ACT nasal spray SPRAY 2 SPRAYS INTO EACH NOSTRIL DAILY  48 mL 3   hydrocortisone (ANUSOL-HC) 2.5 % rectal cream PLACE 1 APPLICATION RECTALLY 2 (TWO) TIMES DAILY. 30 g 2   hydrocortisone cream 1 % Apply 1 application topically 2 (two) times daily. 30 g 0   LORazepam (ATIVAN) 0.5 MG tablet TAKE 1 TABLET BY MOUTH EVERY 8 TO 12 HOURS AS NEEDED 30 tablet 2   losartan (COZAAR) 50 MG tablet Take 1 tablet (50 mg total) by mouth daily. 90 tablet 3   Multiple Vitamin (MULTIVITAMIN) tablet Take 1 tablet by mouth daily.     Omega-3 Fatty Acids (FISH OIL) 500 MG CAPS Take 1 capsule by mouth daily.     pantoprazole (PROTONIX) 40 MG tablet Take 1 tablet (40 mg total) by mouth daily. 90 tablet 3   rosuvastatin (CRESTOR) 40 MG tablet TAKE 1 TABLET BY MOUTH EVERY OTHER DAY 45 tablet 0   triamcinolone cream (KENALOG) 0.1 % APPLY TO AFFECTED AREA TWICE A DAY 30 g 0   [DISCONTINUED] Lansoprazole (PREVACID PO) Take by mouth  daily.       [DISCONTINUED] omeprazole (PRILOSEC) 40 MG capsule Take 1 capsule (40 mg total) by mouth daily. 180 capsule 1   No current facility-administered medications on file prior to visit.        ROS:  All others reviewed and negative.  Objective        PE:  BP 138/80 (BP Location: Right Arm, Patient Position: Sitting, Cuff Size: Large)   Pulse 72   Temp 98.2 F (36.8 C) (Oral)   Ht 5\' 6"  (1.676 m)   Wt 142 lb 9.6 oz (64.7 kg)   SpO2 97%   BMI 23.02 kg/m                 Constitutional: Pt appears in NAD               HENT: Head: NCAT.                Right Ear: External ear normal.                 Left Ear: External ear normal.                Eyes: . Pupils are equal, round, and reactive to light. Conjunctivae and EOM are normal               Nose: without d/c or deformity               Neck: Neck supple. Gross normal ROM               Cardiovascular: Normal rate and regular rhythm.                 Pulmonary/Chest: Effort normal and breath sounds without rales or wheezing.                Abd:  Soft, NT, ND, + BS, no organomegaly               Neurological: Pt is alert. At baseline orientation, motor grossly intact               Skin: Skin is warm. No rashes, no other new lesions, LE edema - none               Psychiatric: Pt behavior is normal without agitation   Micro: none  Cardiac tracings I have personally interpreted today:  none  Pertinent Radiological findings (summarize): none   Lab  Results  Component Value Date   WBC 4.8 11/18/2019   HGB 13.8 11/18/2019   HCT 41.3 11/18/2019   PLT 178.0 11/18/2019   GLUCOSE 106 (H) 06/08/2020   CHOL 220 (H) 06/08/2020   TRIG 155.0 (H) 06/08/2020   HDL 60.60 06/08/2020   LDLDIRECT 150.0 11/18/2019   LDLCALC 129 (H) 06/08/2020   ALT 25 06/08/2020   AST 20 06/08/2020   NA 140 06/08/2020   K 4.3 06/08/2020   CL 103 06/08/2020   CREATININE 1.03 06/08/2020   BUN 15 06/08/2020   CO2 31 06/08/2020   TSH 3.04 11/18/2019    PSA 1.00 11/18/2019   HGBA1C 6.2 06/08/2020   Assessment/Plan:  Kevin Farley is a 65 y.o. Other or two or more races [6] male with  has a past medical history of Allergic rhinitis, Colon polyps, Family history of ischemic heart disease, GERD (gastroesophageal reflux disease), HTN (hypertension) (04/16/2019), Hyperlipidemia, Other abnormal glucose, and Supraspinatus tendon tear.  Pre-diabetes Lab Results  Component Value Date   HGBA1C 6.2 06/08/2020   Stable, pt to continue current medical treatment  - diet - for dietary referral   GERD (gastroesophageal reflux disease) Pt no longer wishes to take dexilant due to wariness of potential side effects, so cange to pepcid 20 bid, and refer nutrition  HTN (hypertension) BP Readings from Last 3 Encounters:  11/09/20 138/80  06/08/20 (!) 150/90  11/18/19 (!) 144/96   improved and Stable, pt to continue medical treatment losartan   Vitamin D deficiency Last vitamin D Lab Results  Component Value Date   VD25OH 34.74 11/18/2019   Low, to start oral replacement   Hyperlipidemia Lab Results  Component Value Date   LDLCALC 129 (H) 06/08/2020   Tolerating crestor well,, pt to continue current crestor , refer nutrition and f/u labs with CPX next month as planned  Followup: Return if symptoms worsen or fail to improve.  Cathlean Cower, MD 11/09/2020 6:43 PM [facility]

## 2020-11-21 ENCOUNTER — Ambulatory Visit: Payer: BC Managed Care – PPO | Admitting: Dietician

## 2020-12-12 ENCOUNTER — Other Ambulatory Visit (INDEPENDENT_AMBULATORY_CARE_PROVIDER_SITE_OTHER): Payer: BC Managed Care – PPO

## 2020-12-12 DIAGNOSIS — E559 Vitamin D deficiency, unspecified: Secondary | ICD-10-CM | POA: Diagnosis not present

## 2020-12-12 DIAGNOSIS — R7303 Prediabetes: Secondary | ICD-10-CM

## 2020-12-12 DIAGNOSIS — E782 Mixed hyperlipidemia: Secondary | ICD-10-CM

## 2020-12-12 DIAGNOSIS — E538 Deficiency of other specified B group vitamins: Secondary | ICD-10-CM

## 2020-12-12 DIAGNOSIS — Z0000 Encounter for general adult medical examination without abnormal findings: Secondary | ICD-10-CM | POA: Diagnosis not present

## 2020-12-12 LAB — CBC WITH DIFFERENTIAL/PLATELET
Basophils Absolute: 0 10*3/uL (ref 0.0–0.1)
Basophils Relative: 1 % (ref 0.0–3.0)
Eosinophils Absolute: 0.1 10*3/uL (ref 0.0–0.7)
Eosinophils Relative: 1.6 % (ref 0.0–5.0)
HCT: 42.2 % (ref 39.0–52.0)
Hemoglobin: 13.9 g/dL (ref 13.0–17.0)
Lymphocytes Relative: 32.5 % (ref 12.0–46.0)
Lymphs Abs: 1.5 10*3/uL (ref 0.7–4.0)
MCHC: 32.9 g/dL (ref 30.0–36.0)
MCV: 85 fl (ref 78.0–100.0)
Monocytes Absolute: 0.5 10*3/uL (ref 0.1–1.0)
Monocytes Relative: 10.3 % (ref 3.0–12.0)
Neutro Abs: 2.5 10*3/uL (ref 1.4–7.7)
Neutrophils Relative %: 54.6 % (ref 43.0–77.0)
Platelets: 165 10*3/uL (ref 150.0–400.0)
RBC: 4.97 Mil/uL (ref 4.22–5.81)
RDW: 13.7 % (ref 11.5–15.5)
WBC: 4.6 10*3/uL (ref 4.0–10.5)

## 2020-12-12 LAB — LIPID PANEL
Cholesterol: 244 mg/dL — ABNORMAL HIGH (ref 0–200)
HDL: 68.2 mg/dL (ref >39.00)
LDL Cholesterol: 139 mg/dL — ABNORMAL HIGH (ref 0–99)
NonHDL: 175.68
Total CHOL/HDL Ratio: 4
Triglycerides: 184 mg/dL — ABNORMAL HIGH (ref 0.0–149.0)
VLDL: 36.8 mg/dL (ref 0.0–40.0)

## 2020-12-12 LAB — URINALYSIS, ROUTINE W REFLEX MICROSCOPIC
Bilirubin Urine: NEGATIVE
Hgb urine dipstick: NEGATIVE
Ketones, ur: NEGATIVE
Leukocytes,Ua: NEGATIVE
Nitrite: NEGATIVE
RBC / HPF: NONE SEEN (ref 0–?)
Specific Gravity, Urine: 1.01 (ref 1.000–1.030)
Total Protein, Urine: NEGATIVE
Urine Glucose: NEGATIVE
Urobilinogen, UA: 0.2 (ref 0.0–1.0)
WBC, UA: NONE SEEN (ref 0–?)
pH: 7 (ref 5.0–8.0)

## 2020-12-12 LAB — BASIC METABOLIC PANEL
BUN: 14 mg/dL (ref 6–23)
CO2: 30 mEq/L (ref 19–32)
Calcium: 9.3 mg/dL (ref 8.4–10.5)
Chloride: 103 mEq/L (ref 96–112)
Creatinine, Ser: 1.05 mg/dL (ref 0.40–1.50)
GFR: 75.29 mL/min (ref >60.00)
Glucose, Bld: 111 mg/dL — ABNORMAL HIGH (ref 70–99)
Potassium: 4.4 mEq/L (ref 3.5–5.1)
Sodium: 142 mEq/L (ref 135–145)

## 2020-12-12 LAB — HEPATIC FUNCTION PANEL
ALT: 28 U/L (ref 0–53)
AST: 20 U/L (ref 0–37)
Albumin: 4.6 g/dL (ref 3.5–5.2)
Alkaline Phosphatase: 52 U/L (ref 39–117)
Bilirubin, Direct: 0.1 mg/dL (ref 0.0–0.3)
Total Bilirubin: 0.5 mg/dL (ref 0.2–1.2)
Total Protein: 7.3 g/dL (ref 6.0–8.3)

## 2020-12-12 LAB — VITAMIN B12: Vitamin B-12: 750 pg/mL (ref 211–911)

## 2020-12-12 LAB — PSA: PSA: 1.52 ng/mL (ref 0.10–4.00)

## 2020-12-12 LAB — TSH: TSH: 4.79 u[IU]/mL (ref 0.35–5.50)

## 2020-12-12 LAB — HEMOGLOBIN A1C: Hgb A1c MFr Bld: 6.1 % (ref 4.6–6.5)

## 2020-12-12 LAB — VITAMIN D 25 HYDROXY (VIT D DEFICIENCY, FRACTURES): VITD: 27.34 ng/mL — ABNORMAL LOW (ref 30.00–100.00)

## 2020-12-18 ENCOUNTER — Ambulatory Visit: Payer: BC Managed Care – PPO | Admitting: Internal Medicine

## 2020-12-18 ENCOUNTER — Other Ambulatory Visit: Payer: Self-pay

## 2020-12-18 ENCOUNTER — Encounter: Payer: Self-pay | Admitting: Internal Medicine

## 2020-12-18 VITALS — BP 140/80 | HR 81 | Temp 98.9°F | Ht 66.0 in | Wt 142.0 lb

## 2020-12-18 DIAGNOSIS — N529 Male erectile dysfunction, unspecified: Secondary | ICD-10-CM | POA: Diagnosis not present

## 2020-12-18 DIAGNOSIS — E782 Mixed hyperlipidemia: Secondary | ICD-10-CM | POA: Diagnosis not present

## 2020-12-18 DIAGNOSIS — I10 Essential (primary) hypertension: Secondary | ICD-10-CM

## 2020-12-18 DIAGNOSIS — E559 Vitamin D deficiency, unspecified: Secondary | ICD-10-CM | POA: Diagnosis not present

## 2020-12-18 DIAGNOSIS — Z0001 Encounter for general adult medical examination with abnormal findings: Secondary | ICD-10-CM

## 2020-12-18 DIAGNOSIS — Z86010 Personal history of colonic polyps: Secondary | ICD-10-CM | POA: Diagnosis not present

## 2020-12-18 DIAGNOSIS — R7303 Prediabetes: Secondary | ICD-10-CM

## 2020-12-18 MED ORDER — EZETIMIBE 10 MG PO TABS: 10.0000 mg | ORAL_TABLET | Freq: Every day | ORAL | 3 refills | Status: DC

## 2020-12-18 MED ORDER — SILDENAFIL CITRATE 100 MG PO TABS: 50.0000 mg | ORAL_TABLET | Freq: Every day | ORAL | 11 refills | Status: DC | PRN

## 2020-12-18 MED ORDER — LOSARTAN POTASSIUM 50 MG PO TABS: 50.0000 mg | ORAL_TABLET | Freq: Every day | ORAL | 3 refills | Status: DC

## 2020-12-18 NOTE — Assessment & Plan Note (Signed)
Age and sex appropriate education and counseling updated with regular exercise and diet Referrals for preventative services - due for colonoscopy Immunizations addressed - declines covid booster, shingrix Smoking counseling  - none needed Evidence for depression or other mood disorder - none significant Most recent labs reviewed. I have personally reviewed and have noted: 1) the patient's medical and social history 2) The patient's current medications and supplements 3) The patient's height, weight, and BMI have been recorded in the chart

## 2020-12-18 NOTE — Assessment & Plan Note (Signed)
Last vitamin D Lab Results  Component Value Date   VD25OH 27.34 (L) 12/12/2020   Low, to start oral replacement

## 2020-12-18 NOTE — Patient Instructions (Addendum)
Please check with your insurance about the Shingrix shingles shot coverage  Please consider the Novavax Covid vaccine when available  You will be contacted regarding the referral for: colonoscopy  Please take OTC Vitamin D3 at 2000 units per day, indefinitely  We have discussed the Cardiac CT Score test to measure the calcification level (if any) in your heart arteries.  This test has been ordered in our [facility], so please call [facility] directly, as they prefer this, at [phone] to be scheduled.  If your Cardiac CT score is elevated, you may wish to be referred to the local Lipid Clinic to consider other shots twice per month to help get the cholesterol down  Please take all new medication as prescribed - the zetia 10 mg per day, and viagra as needed  Please continue all other medications as before, and refills have been done if requested.  Please have the pharmacy call with any other refills you may need.  Please continue your efforts at being more active, low cholesterol diet, and weight control.  You are otherwise up to date with prevention measures today.  Please keep your appointments with your specialists as you may have planned  Please make an Appointment to return in 6 months, or sooner if needed, also with Lab Appointment for testing done 3-5 days before at the [facility] (so this is for TWO appointments - please see the scheduling desk as you leave)  Due to the ongoing Covid 19 pandemic, our lab now requires an appointment for any labs done at our office.  If you need labs done and do not have an appointment, please call our office ahead of time to schedule before presenting to the lab for your testing.

## 2020-12-18 NOTE — Assessment & Plan Note (Signed)
Lab Results  Component Value Date   HGBA1C 6.1 12/12/2020   Stable, pt to continue current medical treatment  -diet

## 2020-12-18 NOTE — Assessment & Plan Note (Signed)
BP Readings from Last 3 Encounters:  12/18/20 140/80  11/09/20 138/80  06/08/20 (!) 150/90   Persistently mild uncontrolled, pt urged to start losartan

## 2020-12-18 NOTE — Assessment & Plan Note (Signed)
Mild worsening recent, for viagra prn

## 2020-12-18 NOTE — Assessment & Plan Note (Signed)
Lab Results  Component Value Date   LDLCALC 139 (H) 12/12/2020   Uncontrolled goal ldl < 100 for now, to check Ct Cardiac score, pt declines statin increased over crestor qod,  but will accept zetia 10 qd; if Ct anormal then goal would be < 70- d/w pt to consider referral lipid clinic to consider such as repatha

## 2020-12-18 NOTE — Progress Notes (Signed)
Patient ID: Kevin Farley, male   DOB: 01-Sep-1955, 65 y.o.   MRN: [id number]         Chief Complaint:: wellness exam and htn, low vit d, preDm, hld       HPI:  Kevin Farley is a 65 y.o. male here for wellness exam; due for colonoscopy; declines covid boostsr, shingrx, pneumovax, and tdap o/w up to date                        Also Pt denies chest pain, increased sob or doe, wheezing, orthopnea, PND, increased LE swelling, palpitations, dizziness or syncope.   Pt denies polydipsia, polyuria, or new focal neuro s/s.  Not taking losartan, has been working on diet and exercise.  Is interested in Cardiac Ct score.  Has had worsening ED symptoms in the past 6 mo, asks for viagra prn  No other new complaints.  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  .      Wt Readings from Last 3 Encounters:  12/18/20 142 lb (64.4 kg)  11/09/20 142 lb 9.6 oz (64.7 kg)  06/08/20 141 lb (64 kg)   BP Readings from Last 3 Encounters:  12/18/20 140/80  11/09/20 138/80  06/08/20 (!) 150/90   Immunization History  Administered Date(s) Administered   Influenza Split 11/08/2011   Influenza,inj,Quad PF,6+ Mos 10/21/2012, 10/27/2013, 01/20/2015, 01/09/2016, 11/21/2016, 12/16/2017, 10/30/2018, 11/09/2020   Influenza-Unspecified 11/21/2016, 02/25/2020   PFIZER(Purple Top)SARS-COV-2 Vaccination 04/23/2019, 05/17/2019, 02/08/2020   Tdap 10/19/2010   Health Maintenance Due  Topic Date Due   COLONOSCOPY (Pts 45-103yrs Insurance coverage will need to be confirmed)  01/27/2018      Past Medical History:  Diagnosis Date   Allergic rhinitis    Colon polyps    adenomatous 2011 & 2014   Family history of ischemic heart disease    GERD (gastroesophageal reflux disease)    HTN (hypertension) 04/16/2019   Hyperlipidemia    Other abnormal glucose    A1c 6% in 06/2009   Supraspinatus tendon tear    Past Surgical History:  Procedure Laterality Date   BACK SURGERY  2006   Dr Carloyn Manner, [place]   cardic catherization  2005    negative; Dr Lyla Son   colon polyps  2011 & 2014   Dr.Jacobs    CORNEAL TRANSPLANT Left 1993   Dr Kathrin Penner   nasal bone surgery  2007   UPPER GASTROINTESTINAL ENDOSCOPY  2011   mild gastritis; Dr Ardis Hughs    reports that he has never smoked. He has never used smokeless tobacco. He reports current alcohol use of about 1.0 standard drink per week. He reports that he does not use drugs. family history includes Diabetes in his brother, brother, father, mother, and sister; Heart attack in his brother and father; Hypertension in his brother, father, mother, and sister; Kidney failure in his brother and brother; Stroke in his mother; Transient ischemic attack in his brother. Allergies  Allergen Reactions   Aspirin     REACTION: hives States tolerates IBU & acetaminophen OK   Other     Walnuts & pecans cause hives   Codeine     Itching with codeine containing cough syrup 9/15   Zetia [Ezetimibe]     See 09/22/14 musculoskeletal pains   Ezetimibe-Simvastatin Hives    REACTION: myalgia   Current Outpatient Medications on File Prior to Visit  Medication Sig Dispense Refill   acyclovir (ZOVIRAX) 400 MG tablet Take 400 mg  by mouth daily.     Cholecalciferol (THERA-D 2000) 50 MCG (2000 UT) TABS 1 tab by mouth once daily 30 tablet 99   Coenzyme Q10 (CO Q-10) 100 MG CAPS Take 1 capsule by mouth daily.     DEXILANT 60 MG capsule TAKE 1 CAPSULE BY MOUTH EVERY DAY 90 capsule 3   Epinastine HCl 0.05 % ophthalmic solution SMARTSIG:1 Drop(s) In Eye(s) Twice Daily PRN     famotidine (PEPCID) 20 MG tablet Take 1 tablet (20 mg total) by mouth 2 (two) times daily. 60 tablet 11   Flaxseed, Linseed, (FLAX SEED OIL PO) Take by mouth.     fluticasone (FLONASE) 50 MCG/ACT nasal spray SPRAY 2 SPRAYS INTO EACH NOSTRIL DAILY 48 mL 3   hydrocortisone (ANUSOL-HC) 2.5 % rectal cream PLACE 1 APPLICATION RECTALLY 2 (TWO) TIMES DAILY. 30 g 2   hydrocortisone cream 1 % Apply 1 application topically 2 (two) times daily. 30  g 0   LORazepam (ATIVAN) 0.5 MG tablet TAKE 1 TABLET BY MOUTH EVERY 8 TO 12 HOURS AS NEEDED 30 tablet 2   Multiple Vitamin (MULTIVITAMIN) tablet Take 1 tablet by mouth daily.     Omega-3 Fatty Acids (FISH OIL) 500 MG CAPS Take 1 capsule by mouth daily.     pantoprazole (PROTONIX) 40 MG tablet Take 1 tablet (40 mg total) by mouth daily. 90 tablet 3   rosuvastatin (CRESTOR) 40 MG tablet TAKE 1 TABLET BY MOUTH EVERY OTHER DAY 45 tablet 0   triamcinolone cream (KENALOG) 0.1 % APPLY TO AFFECTED AREA TWICE A DAY 30 g 0   [DISCONTINUED] Lansoprazole (PREVACID PO) Take by mouth daily.       [DISCONTINUED] omeprazole (PRILOSEC) 40 MG capsule Take 1 capsule (40 mg total) by mouth daily. 180 capsule 1   No current facility-administered medications on file prior to visit.        ROS:  All others reviewed and negative.  Objective        PE:  BP 140/80 (BP Location: Left Arm, Patient Position: Sitting, Cuff Size: Large)   Pulse 81   Temp 98.9 F (37.2 C) (Oral)   Ht 5\' 6"  (1.676 m)   Wt 142 lb (64.4 kg)   SpO2 98%   BMI 22.92 kg/m                 Constitutional: Pt appears in NAD               HENT: Head: NCAT.                Right Ear: External ear normal.                 Left Ear: External ear normal.                Eyes: . Pupils are equal, round, and reactive to light. Conjunctivae and EOM are normal               Nose: without d/c or deformity               Neck: Neck supple. Gross normal ROM               Cardiovascular: Normal rate and regular rhythm.                 Pulmonary/Chest: Effort normal and breath sounds without rales or wheezing.                Abd:  Soft, NT, ND, + BS, no organomegaly               Neurological: Pt is alert. At baseline orientation, motor grossly intact               Skin: Skin is warm. No rashes, no other new lesions, LE edema - none               Psychiatric: Pt behavior is normal without agitation   Micro: none  Cardiac tracings I have personally  interpreted today:  none  Pertinent Radiological findings (summarize): none   Lab Results  Component Value Date   WBC 4.6 12/12/2020   HGB 13.9 12/12/2020   HCT 42.2 12/12/2020   PLT 165.0 12/12/2020   GLUCOSE 111 (H) 12/12/2020   CHOL 244 (H) 12/12/2020   TRIG 184.0 (H) 12/12/2020   HDL 68.20 12/12/2020   LDLDIRECT 150.0 11/18/2019   LDLCALC 139 (H) 12/12/2020   ALT 28 12/12/2020   AST 20 12/12/2020   NA 142 12/12/2020   K 4.4 12/12/2020   CL 103 12/12/2020   CREATININE 1.05 12/12/2020   BUN 14 12/12/2020   CO2 30 12/12/2020   TSH 4.79 12/12/2020   PSA 1.52 12/12/2020   HGBA1C 6.1 12/12/2020   Assessment/Plan:  Kevin Farley is a 65 y.o. Other or two or more races [6] male with  has a past medical history of Allergic rhinitis, Colon polyps, Family history of ischemic heart disease, GERD (gastroesophageal reflux disease), HTN (hypertension) (04/16/2019), Hyperlipidemia, Other abnormal glucose, and Supraspinatus tendon tear.  Encounter for well adult exam with abnormal findings Age and sex appropriate education and counseling updated with regular exercise and diet Referrals for preventative services - due for colonoscopy Immunizations addressed - declines covid booster, shingrix Smoking counseling  - none needed Evidence for depression or other mood disorder - none significant Most recent labs reviewed. I have personally reviewed and have noted: 1) the patient's medical and social history 2) The patient's current medications and supplements 3) The patient's height, weight, and BMI have been recorded in the chart   Vitamin D deficiency Last vitamin D Lab Results  Component Value Date   VD25OH 27.34 (L) 12/12/2020   Low, to start oral replacement   Hyperlipidemia Lab Results  Component Value Date   LDLCALC 139 (H) 12/12/2020   Uncontrolled goal ldl < 100 for now, to check Ct Cardiac score, pt declines statin increased over crestor qod,  but will accept zetia 10 qd;  if Ct anormal then goal would be < 70- d/w pt to consider referral lipid clinic to consider such as repatha   Pre-diabetes Lab Results  Component Value Date   HGBA1C 6.1 12/12/2020   Stable, pt to continue current medical treatment  -diet   HTN (hypertension) BP Readings from Last 3 Encounters:  12/18/20 140/80  11/09/20 138/80  06/08/20 (!) 150/90   Persistently mild uncontrolled, pt urged to start losartan   Erectile dysfunction Mild worsening recent, for viagra prn  Followup: Return in about 6 months (around 06/17/2021).  Cathlean Cower, MD 12/18/2020 9:30 PM [facility]

## 2021-01-03 ENCOUNTER — Other Ambulatory Visit: Payer: Self-pay | Admitting: Internal Medicine

## 2021-01-10 ENCOUNTER — Encounter: Payer: Self-pay | Admitting: Dietician

## 2021-01-10 ENCOUNTER — Encounter: Payer: BC Managed Care – PPO | Attending: Internal Medicine | Admitting: Dietician

## 2021-01-10 ENCOUNTER — Other Ambulatory Visit: Payer: Self-pay

## 2021-01-10 DIAGNOSIS — E782 Mixed hyperlipidemia: Secondary | ICD-10-CM | POA: Diagnosis not present

## 2021-01-10 DIAGNOSIS — R7303 Prediabetes: Secondary | ICD-10-CM | POA: Insufficient documentation

## 2021-01-10 NOTE — Patient Instructions (Addendum)
Add some intensity to your physical activity each day. Ride your stationary bike for 15 minutes in the morning, 3 days a week, until you get your heart rate up.  Check your fasting blood sugar once a week. Your ideal range is between 70-100.  When snacking on nuts, only have one palm full for a swerving size.  Look into "Divided Portion Plates" on [place] for balanced plates to build meals on.  Work towards eating three meals a day, about 5-6 hours apart!  Begin to recognize carbohydrates in your food choices!  Begin to build your meals using the proportions of the Balanced Plate. First, select your carb choice(s) for the meal. 25% Next, select your source of protein to pair with your carb choice(s). 25% Finally, complete the remaining half of your meal with a variety of non-starchy vegetables. 50%

## 2021-01-10 NOTE — Progress Notes (Signed)
Medical Nutrition Therapy  Appointment Start time:  [phone]  Appointment End time:  1725  Primary concerns today: Prediabetes, Cholesterol  Referral diagnosis: K21.9 - GERD, R73.03 - Prediabetes, I10 - Hypertension, E78.2 - Mixed Hyperlipidemia Preferred learning style: No preference indicated Learning readiness: Ready   NUTRITION ASSESSMENT   Anthropometrics  Ht:5'6" Wt: 144.8 lbs Body mass index is 23.36 kg/m.   Clinical Medical Hx: Prediabetes, Hypertension, Hyperlipidemia, GERD, Dyspepsia, Vit D deficiency. 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 Pt reports family history of diabetes and hyperlipidemia. Pt has tried medication, but experienced side effects. Pt reports muscle and joint pain associated with statins, is taking rosuvastatin every other day with no side effects. Pt reports checking FBG once a month, ranges around 109-119. Pt reports eating salad at every meal, may snack on fruits or have them for desserts, or peanuts and chickpeas. Pt spouse does the majority of cooking, wants to help improve pt health.   Estimated daily fluid intake: 96 oz Supplements: MVU, CoQ10, 03 FA, Blackseed Oil Sleep: Sleeps well, 3-4 hours, wakes up for a couple hours Stress / self-care: 6/10 Current average weekly physical activity: ADLs, gardening, house work, farm work 20,000  24-Hr Dietary Recall First Meal: water, salad with chick peas and potatoes, roti bread, Boost, decaf coffee w/ honey Snack: None Second Meal: [place] chicken and shrimp with rice, broccoli, miso soup, water Snack: a few pretzels Third Meal: Rice, broccoli, dahl, a couple dates, 2  glasses of red wine Snack: None Beverages: Water, coffee    NUTRITION DIAGNOSIS  NB-1.7 Undesireable food choices As related to prediabetes, hyperlipidemia.  As evidenced by dietary recall high in carbohydrates, A1c of 6.1%, serum cholesterol of 244 mg/dL, serum LDL of 139 mg/dL.   NUTRITION INTERVENTION  Nutrition education (E-1) on the following topics:  Educated patient on the balanced plate eating model. Recommended lunch and dinner be 1/2 non-starchy vegetables, 1/4 starches, and 1/4 protein. Recommended breakfast be a balance of starch and protein with a piece of fruit. Discussed with patient the importance of working towards hitting the proportions of the balanced plate consistently. Counseled patient on ways to begin recognizing each of the food groups from the balanced plate in their own meals, and how close they are to fitting the recommended proportions of the balanced plate. Educated patient on the nutritional value of each food group on the balanced plate model.  Educated patient on factors that contribute to elevation of blood sugars, such as stress, illness, injury,and food choices. Discussed the role that physical activity plays in lowering blood sugar. Educate patient on the three main macronutrients. Protein, fats, and carbohydrates. Discussed how each of these macronutrients affect blood sugar levels, especially carbohydrate, and the importance of eating a consistent amount of carbohydrate throughout the day.  Educate pt on factors that can elevate LDL cholesterol, including high dietary intake of saturated fats. Educate pt on identifying sources of saturated fats, and how to make alternative food choices to lower saturated fat intake. Educate pt on the role of physical activity in lowering LDL and increasing HDL cholesterol.   Handouts Provided Include  Balanced Plate Balanced Plate Food List  Learning Style & Readiness for Change Teaching method utilized: Visual & Auditory   Demonstrated degree of understanding via: Teach Back  Barriers to learning/adherence to lifestyle change: None  Goals Established by Pt Add some intensity to your physical activity each day. Ride your stationary bike for 15 minutes in the morning, 3 days a week, until you get your heart rate up. Check your fasting blood sugar once a week. Your ideal range is between 70-100. When snacking on nuts, only have one palm full for a swerving size. Look into "Divided Portion Plates" on [place] for balanced plates to build meals on. Work towards eating three meals a day, about 5-6 hours apart! Begin to recognize carbohydrates in your food choices! Begin to build your meals using the proportions of the Balanced Plate. First, select your carb choice(s) for the meal. 25% Next, select your source of protein to pair with your carb choice(s). 25% Finally, complete the remaining half of your meal with a variety of non-starchy vegetables. 50%   MONITORING & EVALUATION Dietary intake, weekly physical activity, and A1c, fasting blood sugar in 6 months.  Next Steps  Patient is to follow up with RDN.

## 2021-02-02 IMAGING — DX DG CHEST 2V
2 series · 2 of 2 positions shown · non-contrast
Comparison: 06/29/2014 from [REDACTED]

CLINICAL DATA: Recurrent dizziness.  Hypertension.

EXAM:
CHEST - 2 VIEW

[chest pa]
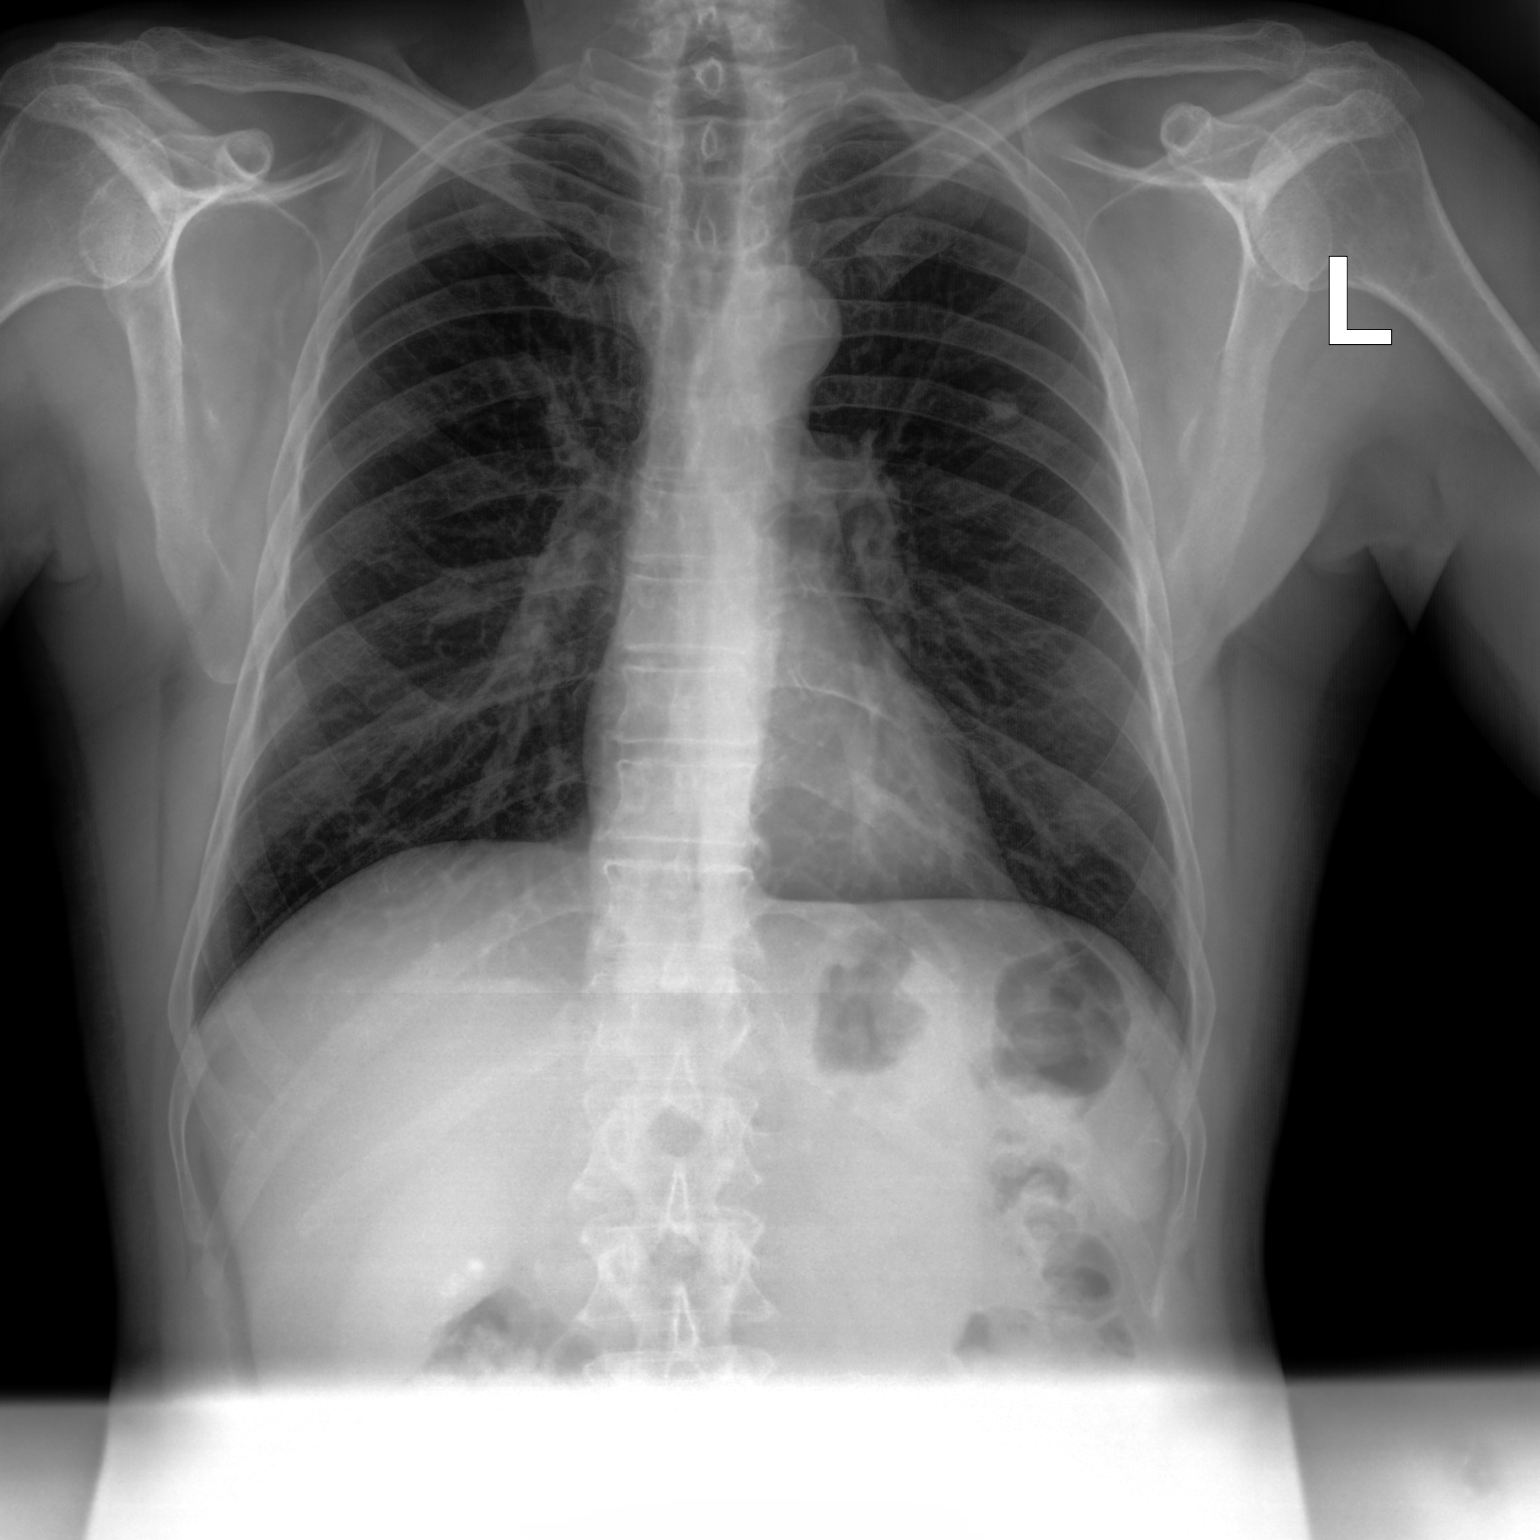

[chest lat]
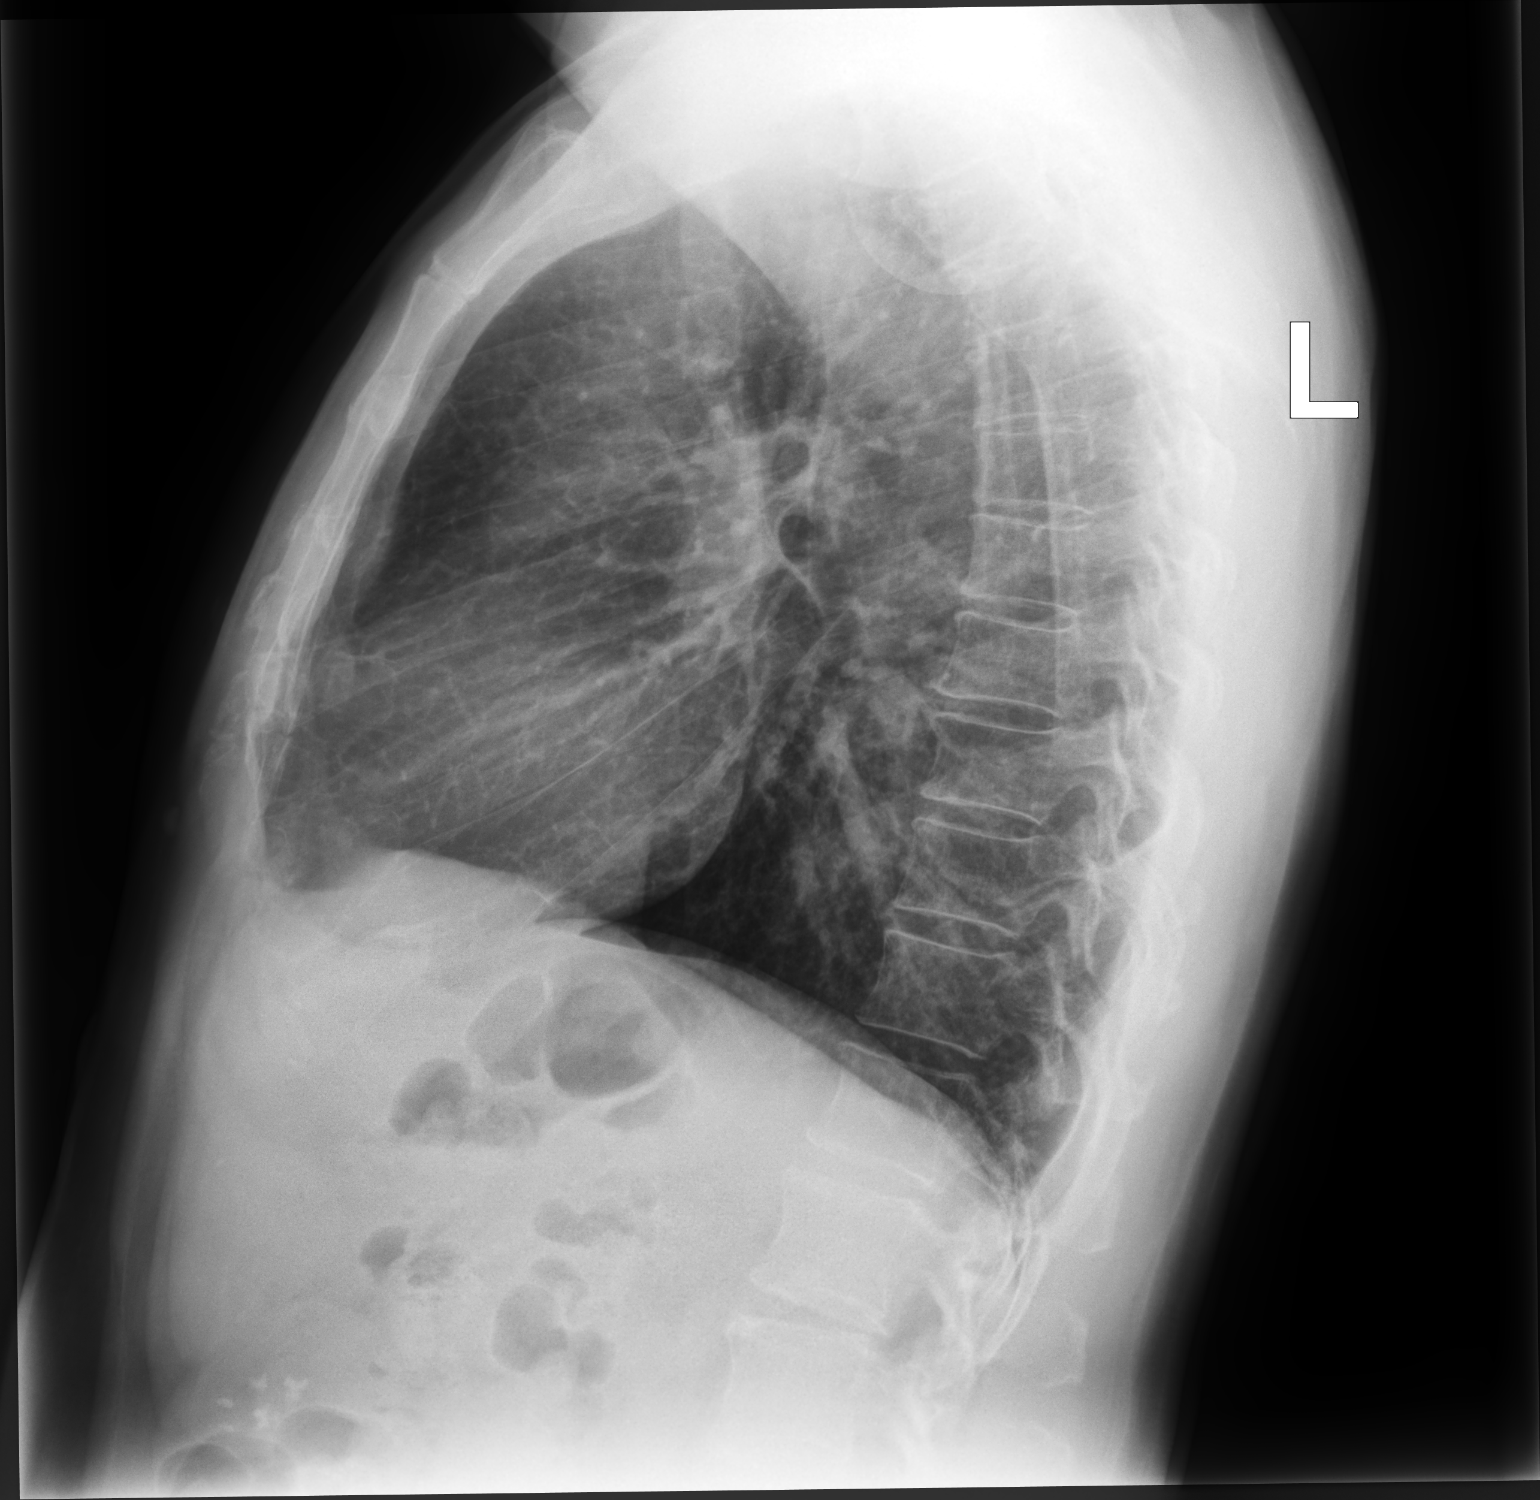

[2 of 2 positions shown; findings below may reference images not displayed]

FINDINGS: The heart size and mediastinal contours are within normal limits. No
evidence of pulmonary infiltrate or edema. No evidence of pleural
effusion. Calcified granuloma in the left upper lobe remains stable.
The visualized skeletal structures are unremarkable.
IMPRESSION: No active cardiopulmonary disease.

## 2021-02-14 DIAGNOSIS — H43811 Vitreous degeneration, right eye: Secondary | ICD-10-CM | POA: Diagnosis not present

## 2021-02-14 DIAGNOSIS — H524 Presbyopia: Secondary | ICD-10-CM | POA: Diagnosis not present

## 2021-02-14 DIAGNOSIS — Z947 Corneal transplant status: Secondary | ICD-10-CM | POA: Diagnosis not present

## 2021-02-14 DIAGNOSIS — H2513 Age-related nuclear cataract, bilateral: Secondary | ICD-10-CM | POA: Diagnosis not present

## 2021-04-09 ENCOUNTER — Other Ambulatory Visit: Payer: Self-pay | Admitting: Internal Medicine

## 2021-04-09 NOTE — Telephone Encounter (Signed)
Ok for PA?

## 2021-04-11 NOTE — Telephone Encounter (Signed)
PA sent to plan and approved immediately  ? ?KEY: [id number] ?

## 2021-04-25 ENCOUNTER — Telehealth: Payer: Self-pay | Admitting: Internal Medicine

## 2021-04-25 NOTE — Telephone Encounter (Signed)
1.Medication Requested: hydrocortisone (ANUSOL-HC) 2.5 % rectal cream ? ?2. Pharmacy (Name, Street, [place]): [id number] [place], [place]? ?3. On Med List: Y ? ?4. Last Visit with PCP: 12-18-2020 ? ?5. Next visit date with PCP: n/a ? ? ?Agent: Please be advised that RX refills may take up to 3 business days. We ask that you follow-up with your pharmacy.  ?

## 2021-04-26 MED ORDER — HYDROCORTISONE (PERIANAL) 2.5 % EX CREA: TOPICAL_CREAM | CUTANEOUS | 2 refills | Status: DC

## 2021-05-08 ENCOUNTER — Ambulatory Visit: Payer: BC Managed Care – PPO | Admitting: Internal Medicine

## 2021-05-08 ENCOUNTER — Encounter: Payer: Self-pay | Admitting: Internal Medicine

## 2021-05-08 VITALS — BP 122/70 | HR 77 | Temp 97.9°F | Ht 66.0 in | Wt 142.0 lb

## 2021-05-08 DIAGNOSIS — R059 Cough, unspecified: Secondary | ICD-10-CM

## 2021-05-08 DIAGNOSIS — E782 Mixed hyperlipidemia: Secondary | ICD-10-CM | POA: Diagnosis not present

## 2021-05-08 DIAGNOSIS — J069 Acute upper respiratory infection, unspecified: Secondary | ICD-10-CM

## 2021-05-08 DIAGNOSIS — E538 Deficiency of other specified B group vitamins: Secondary | ICD-10-CM

## 2021-05-08 DIAGNOSIS — R739 Hyperglycemia, unspecified: Secondary | ICD-10-CM | POA: Diagnosis not present

## 2021-05-08 DIAGNOSIS — R7303 Prediabetes: Secondary | ICD-10-CM

## 2021-05-08 DIAGNOSIS — Z0000 Encounter for general adult medical examination without abnormal findings: Secondary | ICD-10-CM

## 2021-05-08 DIAGNOSIS — E559 Vitamin D deficiency, unspecified: Secondary | ICD-10-CM

## 2021-05-08 MED ORDER — ONETOUCH ULTRA 2 W/DEVICE KIT: PACK | 0 refills | Status: DC

## 2021-05-08 MED ORDER — HYDROCODONE BIT-HOMATROP MBR 5-1.5 MG/5ML PO SOLN: 5.0000 mL | Freq: Four times a day (QID) | ORAL | 0 refills | Status: AC | PRN

## 2021-05-08 MED ORDER — ONETOUCH ULTRA VI STRP: ORAL_STRIP | 12 refills | Status: DC

## 2021-05-08 MED ORDER — ZOSTER VAC RECOMB ADJUVANTED 50 MCG/0.5ML IM SUSR: 0.5000 mL | Freq: Once | INTRAMUSCULAR | 1 refills | Status: AC

## 2021-05-08 MED ORDER — LANCETS MISC: 3 refills | Status: DC

## 2021-05-08 MED ORDER — AZITHROMYCIN 250 MG PO TABS: ORAL_TABLET | ORAL | 1 refills | Status: AC

## 2021-05-08 NOTE — Assessment & Plan Note (Signed)
Mild to mod, for antibx course, cough med prn, to f/u any worsening symptoms or concerns 

## 2021-05-08 NOTE — Patient Instructions (Addendum)
Your COVID and Strep testing was negative ? ?Please take all new medication as prescribed - the antibiotic, and cough medicine as needed ? ?Please continue all other medications as before, as well as the onetouch machine to check sugars ? ?Please have the pharmacy call with any other refills you may need. ? ?Please continue your efforts at being more active, low cholesterol diet, and weight control ? ?Please keep your appointments with your specialists as you may have planned ? ?The shingrix (shingles shot) prescription was sent to the pharmacy ? ? ? ? ? ?

## 2021-05-08 NOTE — Assessment & Plan Note (Signed)
Last vitamin D ?Lab Results  ?Component Value Date  ? VD25OH 27.34 (L) 12/12/2020  ? ?Low, to start oral replacement ? ?

## 2021-05-08 NOTE — Assessment & Plan Note (Signed)
Lab Results  ?Component Value Date  ? LDLCALC 139 (H) 12/12/2020  ? ?Uncontrolled, goal ldl < 100, pt to continue current statin crestor, low chol diet, f/u lipids next visit ? ?

## 2021-05-08 NOTE — Progress Notes (Signed)
Patient ID: Kevin Farley, male   DOB: 12-15-1955, 66 y.o.   MRN: [id number] ? ? ? ?    Chief Complaint: follow up URI with cough, low vit d, hyperglycemia, hld ? ?     HPI:  Kevin Farley is a 66 y.o. male here with wife, c/o 2/5 wks symptoms acute onset fever, facial pain, pressure, headache, general weakness and malaise, and greenish d/c, with mild ST and prod cough greenish sputum, but pt denies chest pain, wheezing, increased sob or doe, orthopnea, PND, increased LE swelling, palpitations, dizziness or syncope.   Pt denies polydipsia, polyuria, or new focal neuro s/s.    Pt denies wt loss, night sweats, loss of appetite, or other constitutional symptoms  Not taking Vit D.   ?      ?Wt Readings from Last 3 Encounters:  ?05/08/21 142 lb (64.4 kg)  ?01/10/21 144 lb 11.2 oz (65.6 kg)  ?12/18/20 142 lb (64.4 kg)  ? ?BP Readings from Last 3 Encounters:  ?05/08/21 122/70  ?12/18/20 140/80  ?11/09/20 138/80  ? ?      ?Past Medical History:  ?Diagnosis Date  ? Allergic rhinitis   ? Colon polyps   ? adenomatous 2011 & 2014  ? Family history of ischemic heart disease   ? GERD (gastroesophageal reflux disease)   ? HTN (hypertension) 04/16/2019  ? Hyperlipidemia   ? Other abnormal glucose   ? A1c 6% in 06/2009  ? Supraspinatus tendon tear   ? ?Past Surgical History:  ?Procedure Laterality Date  ? BACK SURGERY  2006  ? Dr Carloyn Manner, [place]  ? cardic catherization  2005  ? negative; Dr Lyla Son  ? colon polyps  2011 & 2014  ? Dr.Jacobs   ? CORNEAL TRANSPLANT Left 1993  ? Dr Kathrin Penner  ? nasal bone surgery  2007  ? UPPER GASTROINTESTINAL ENDOSCOPY  2011  ? mild gastritis; Dr Ardis Hughs  ? ? reports that he has never smoked. He has never used smokeless tobacco. He reports current alcohol use of about 1.0 standard drink per week. He reports that he does not use drugs. ?family history includes Diabetes in his brother, brother, father, mother, and sister; Heart attack in his brother and father; Hypertension in his brother, father, mother, and sister;  Kidney failure in his brother and brother; Stroke in his mother; Transient ischemic attack in his brother. ?Allergies  ?Allergen Reactions  ? Aspirin   ?  REACTION: hives ?States tolerates IBU & acetaminophen OK  ? Other   ?  Walnuts & pecans cause hives  ? Codeine   ?  Itching with codeine containing cough syrup 9/15  ? Pecan Nut (Diagnostic) Hives  ? Zetia [Ezetimibe]   ?  See 09/22/14 musculoskeletal pains  ? Ezetimibe-Simvastatin Hives  ?  REACTION: myalgia  ? ?Current Outpatient Medications on File Prior to Visit  ?Medication Sig Dispense Refill  ? Cholecalciferol (THERA-D 2000) 50 MCG (2000 UT) TABS 1 tab by mouth once daily 30 tablet 99  ? Coenzyme Q10 (CO Q-10) 100 MG CAPS Take 1 capsule by mouth daily.    ? DEXILANT 60 MG capsule TAKE 1 CAPSULE BY MOUTH EVERY DAY 90 capsule 3  ? Epinastine HCl 0.05 % ophthalmic solution SMARTSIG:1 Drop(s) In Eye(s) Twice Daily PRN    ? fluticasone (FLONASE) 50 MCG/ACT nasal spray SPRAY 2 SPRAYS INTO EACH NOSTRIL DAILY 48 mL 3  ? hydrocortisone (ANUSOL-HC) 2.5 % rectal cream PLACE 1 APPLICATION RECTALLY 2 (TWO) TIMES DAILY 30 g  2  ? hydrocortisone cream 1 % Apply 1 application topically 2 (two) times daily. 30 g 0  ? losartan (COZAAR) 50 MG tablet Take 1 tablet (50 mg total) by mouth daily. 90 tablet 3  ? Multiple Vitamin (MULTIVITAMIN) tablet Take 1 tablet by mouth daily.    ? Omega-3 Fatty Acids (FISH OIL) 500 MG CAPS Take 1 capsule by mouth daily.    ? rosuvastatin (CRESTOR) 40 MG tablet TAKE 1 TABLET BY MOUTH EVERY OTHER DAY 45 tablet 3  ? sildenafil (VIAGRA) 100 MG tablet Take 0.5-1 tablets (50-100 mg total) by mouth daily as needed for erectile dysfunction. 5 tablet 11  ? triamcinolone cream (KENALOG) 0.1 % APPLY TO AFFECTED AREA TWICE A DAY 30 g 0  ? acyclovir (ZOVIRAX) 400 MG tablet Take 400 mg by mouth daily. (Patient not taking: Reported on 01/10/2021)    ? ezetimibe (ZETIA) 10 MG tablet Take 1 tablet (10 mg total) by mouth daily. (Patient not taking: Reported on  01/10/2021) 90 tablet 3  ? famotidine (PEPCID) 20 MG tablet Take 1 tablet (20 mg total) by mouth 2 (two) times daily. (Patient not taking: Reported on 01/10/2021) 60 tablet 11  ? Flaxseed, Linseed, (FLAX SEED OIL PO) Take by mouth. (Patient not taking: Reported on 01/10/2021)    ? LORazepam (ATIVAN) 0.5 MG tablet TAKE 1 TABLET BY MOUTH EVERY 8 TO 12 HOURS AS NEEDED (Patient not taking: Reported on 01/10/2021) 30 tablet 2  ? pantoprazole (PROTONIX) 40 MG tablet Take 1 tablet (40 mg total) by mouth daily. (Patient not taking: Reported on 01/10/2021) 90 tablet 3  ? [DISCONTINUED] Lansoprazole (PREVACID PO) Take by mouth daily.      ? [DISCONTINUED] omeprazole (PRILOSEC) 40 MG capsule Take 1 capsule (40 mg total) by mouth daily. 180 capsule 1  ? ?No current facility-administered medications on file prior to visit.  ? ?     ROS:  All others reviewed and negative. ? ?Objective  ? ?     PE:  BP 122/70 (BP Location: Left Arm, Patient Position: Sitting, Cuff Size: Large)   Pulse 77   Temp 97.9 ?F (36.6 ?C) (Oral)   Ht '5\' 6"'$  (1.676 m)   Wt 142 lb (64.4 kg)   SpO2 97%   BMI 22.92 kg/m?  ? ?              Constitutional: Pt appears in NAD ?              HENT: Head: NCAT.  ?              Right Ear: External ear normal.   ?              Left Ear: External ear normal. Bilat tm's with mild erythema.  Max sinus areas mild tender.  Pharynx with mild erythema, no exudate ?              Eyes: . Pupils are equal, round, and reactive to light. Conjunctivae and EOM are normal ?              Nose: without d/c or deformity ?              Neck: Neck supple. Gross normal ROM ?              Cardiovascular: Normal rate and regular rhythm.   ?              Pulmonary/Chest: Effort normal and breath sounds without  rales or wheezing.  ?              Abd:  Soft, NT, ND, + BS, no organomegaly ?              Neurological: Pt is alert. At baseline orientation, motor grossly intact ?              Skin: Skin is warm. No rashes, no other new lesions,  LE edema - none ?              Psychiatric: Pt behavior is normal without agitation  ? ?Micro: none ? ?Cardiac tracings I have personally interpreted today:  none ? ?Pertinent Radiological findings (summarize): none  ? ?Lab Results  ?Component Value Date  ? WBC 4.6 12/12/2020  ? HGB 13.9 12/12/2020  ? HCT 42.2 12/12/2020  ? PLT 165.0 12/12/2020  ? GLUCOSE 111 (H) 12/12/2020  ? CHOL 244 (H) 12/12/2020  ? TRIG 184.0 (H) 12/12/2020  ? HDL 68.20 12/12/2020  ? LDLDIRECT 150.0 11/18/2019  ? LDLCALC 139 (H) 12/12/2020  ? ALT 28 12/12/2020  ? AST 20 12/12/2020  ? NA 142 12/12/2020  ? K 4.4 12/12/2020  ? CL 103 12/12/2020  ? CREATININE 1.05 12/12/2020  ? BUN 14 12/12/2020  ? CO2 30 12/12/2020  ? TSH 4.79 12/12/2020  ? PSA 1.52 12/12/2020  ? HGBA1C 6.1 12/12/2020  ? ? ?POCT - Covid - neg, Strep - neg ? ?Assessment/Plan:  ?FARZAD TIBBETTS is a 67 y.o. Other or two or more races [6] male with  has a past medical history of Allergic rhinitis, Colon polyps, Family history of ischemic heart disease, GERD (gastroesophageal reflux disease), HTN (hypertension) (04/16/2019), Hyperlipidemia, Other abnormal glucose, and Supraspinatus tendon tear. ? ?Upper respiratory infection ?Mild to mod, for antibx course, cough med prn, to f/u any worsening symptoms or concerns ? ?Vitamin D deficiency ?Last vitamin D ?Lab Results  ?Component Value Date  ? VD25OH 27.34 (L) 12/12/2020  ? ?Low, to start oral replacement ? ? ?Pre-diabetes ?Lab Results  ?Component Value Date  ? HGBA1C 6.1 12/12/2020  ? ?Stable, pt to continue current medical treatment  - diet ? ? ?Hyperlipidemia ?Lab Results  ?Component Value Date  ? LDLCALC 139 (H) 12/12/2020  ? ?Uncontrolled, goal ldl < 100, pt to continue current statin crestor, low chol diet, f/u lipids next visit ? ?Followup: Return if symptoms worsen or fail to improve. ? ?Cathlean Cower, MD 05/08/2021 8:34 PM ?[place] ?[place] ?Internal Medicine ?

## 2021-05-08 NOTE — Assessment & Plan Note (Signed)
Lab Results  ?Component Value Date  ? HGBA1C 6.1 12/12/2020  ? ?Stable, pt to continue current medical treatment  - diet ? ?

## 2021-05-09 LAB — POC COVID19 BINAXNOW: SARS Coronavirus 2 Ag: NEGATIVE

## 2021-05-09 LAB — POCT RAPID STREP A (OFFICE): Rapid Strep A Screen: NEGATIVE

## 2021-05-24 ENCOUNTER — Ambulatory Visit (HOSPITAL_BASED_OUTPATIENT_CLINIC_OR_DEPARTMENT_OTHER)
Admission: RE | Admit: 2021-05-24 | Discharge: 2021-05-24 | Disposition: A | Discharge status: Home / Self Care | Payer: BC Managed Care – PPO | Source: Ambulatory Visit | Attending: Internal Medicine | Admitting: Internal Medicine

## 2021-05-24 ENCOUNTER — Other Ambulatory Visit: Payer: BC Managed Care – PPO

## 2021-05-24 DIAGNOSIS — I10 Essential (primary) hypertension: Secondary | ICD-10-CM

## 2021-05-24 DIAGNOSIS — R7303 Prediabetes: Secondary | ICD-10-CM

## 2021-05-24 DIAGNOSIS — R42 Dizziness and giddiness: Secondary | ICD-10-CM

## 2021-05-24 DIAGNOSIS — E782 Mixed hyperlipidemia: Secondary | ICD-10-CM

## 2021-05-29 ENCOUNTER — Ambulatory Visit: Payer: BC Managed Care – PPO | Admitting: Dietician

## 2021-06-27 ENCOUNTER — Encounter: Payer: Self-pay | Admitting: Internal Medicine

## 2021-06-27 ENCOUNTER — Other Ambulatory Visit: Payer: Self-pay | Admitting: Internal Medicine

## 2021-06-27 ENCOUNTER — Ambulatory Visit (INDEPENDENT_AMBULATORY_CARE_PROVIDER_SITE_OTHER): Payer: BC Managed Care – PPO | Admitting: Internal Medicine

## 2021-06-27 VITALS — BP 158/88 | HR 69 | Temp 98.4°F | Ht 66.0 in | Wt 143.0 lb

## 2021-06-27 DIAGNOSIS — E782 Mixed hyperlipidemia: Secondary | ICD-10-CM | POA: Diagnosis not present

## 2021-06-27 DIAGNOSIS — Z86010 Personal history of colonic polyps: Secondary | ICD-10-CM | POA: Diagnosis not present

## 2021-06-27 DIAGNOSIS — E559 Vitamin D deficiency, unspecified: Secondary | ICD-10-CM | POA: Diagnosis not present

## 2021-06-27 DIAGNOSIS — I10 Essential (primary) hypertension: Secondary | ICD-10-CM

## 2021-06-27 DIAGNOSIS — Z125 Encounter for screening for malignant neoplasm of prostate: Secondary | ICD-10-CM | POA: Diagnosis not present

## 2021-06-27 DIAGNOSIS — R739 Hyperglycemia, unspecified: Secondary | ICD-10-CM

## 2021-06-27 DIAGNOSIS — R918 Other nonspecific abnormal finding of lung field: Secondary | ICD-10-CM | POA: Insufficient documentation

## 2021-06-27 DIAGNOSIS — E538 Deficiency of other specified B group vitamins: Secondary | ICD-10-CM | POA: Diagnosis not present

## 2021-06-27 DIAGNOSIS — Z0001 Encounter for general adult medical examination with abnormal findings: Secondary | ICD-10-CM | POA: Diagnosis not present

## 2021-06-27 DIAGNOSIS — R911 Solitary pulmonary nodule: Secondary | ICD-10-CM

## 2021-06-27 DIAGNOSIS — R7303 Prediabetes: Secondary | ICD-10-CM

## 2021-06-27 LAB — CBC WITH DIFFERENTIAL/PLATELET
Basophils Absolute: 0 10*3/uL (ref 0.0–0.1)
Basophils Relative: 0.7 % (ref 0.0–3.0)
Eosinophils Absolute: 0.1 10*3/uL (ref 0.0–0.7)
Eosinophils Relative: 2 % (ref 0.0–5.0)
HCT: 40.5 % (ref 39.0–52.0)
Hemoglobin: 13.3 g/dL (ref 13.0–17.0)
Lymphocytes Relative: 38.5 % (ref 12.0–46.0)
Lymphs Abs: 1.4 10*3/uL (ref 0.7–4.0)
MCHC: 32.8 g/dL (ref 30.0–36.0)
MCV: 84.6 fl (ref 78.0–100.0)
Monocytes Absolute: 0.4 10*3/uL (ref 0.1–1.0)
Monocytes Relative: 11 % (ref 3.0–12.0)
Neutro Abs: 1.7 10*3/uL (ref 1.4–7.7)
Neutrophils Relative %: 47.8 % (ref 43.0–77.0)
Platelets: 170 10*3/uL (ref 150.0–400.0)
RBC: 4.79 Mil/uL (ref 4.22–5.81)
RDW: 14.4 % (ref 11.5–15.5)
WBC: 3.6 10*3/uL — ABNORMAL LOW (ref 4.0–10.5)

## 2021-06-27 LAB — HEMOGLOBIN A1C: Hgb A1c MFr Bld: 6 % (ref 4.6–6.5)

## 2021-06-27 LAB — PSA: PSA: 1.77 ng/mL (ref 0.10–4.00)

## 2021-06-27 LAB — HEPATIC FUNCTION PANEL
ALT: 21 U/L (ref 0–53)
AST: 17 U/L (ref 0–37)
Albumin: 4.5 g/dL (ref 3.5–5.2)
Alkaline Phosphatase: 49 U/L (ref 39–117)
Bilirubin, Direct: 0.1 mg/dL (ref 0.0–0.3)
Total Bilirubin: 0.5 mg/dL (ref 0.2–1.2)
Total Protein: 7.3 g/dL (ref 6.0–8.3)

## 2021-06-27 LAB — URINALYSIS, ROUTINE W REFLEX MICROSCOPIC
Bilirubin Urine: NEGATIVE
Hgb urine dipstick: NEGATIVE
Ketones, ur: NEGATIVE
Leukocytes,Ua: NEGATIVE
Nitrite: NEGATIVE
RBC / HPF: NONE SEEN (ref 0–?)
Specific Gravity, Urine: 1.015 (ref 1.000–1.030)
Total Protein, Urine: NEGATIVE
Urine Glucose: NEGATIVE
Urobilinogen, UA: 0.2 (ref 0.0–1.0)
WBC, UA: NONE SEEN (ref 0–?)
pH: 7 (ref 5.0–8.0)

## 2021-06-27 LAB — LIPID PANEL
Cholesterol: 233 mg/dL — ABNORMAL HIGH (ref 0–200)
HDL: 67.7 mg/dL (ref >39.00)
LDL Cholesterol: 134 mg/dL — ABNORMAL HIGH (ref 0–99)
NonHDL: 164.9
Total CHOL/HDL Ratio: 3
Triglycerides: 155 mg/dL — ABNORMAL HIGH (ref 0.0–149.0)
VLDL: 31 mg/dL (ref 0.0–40.0)

## 2021-06-27 LAB — BASIC METABOLIC PANEL
BUN: 18 mg/dL (ref 6–23)
CO2: 31 mEq/L (ref 19–32)
Calcium: 9.1 mg/dL (ref 8.4–10.5)
Chloride: 104 mEq/L (ref 96–112)
Creatinine, Ser: 1.06 mg/dL (ref 0.40–1.50)
GFR: 74.16 mL/min (ref >60.00)
Glucose, Bld: 114 mg/dL — ABNORMAL HIGH (ref 70–99)
Potassium: 4.2 mEq/L (ref 3.5–5.1)
Sodium: 141 mEq/L (ref 135–145)

## 2021-06-27 LAB — VITAMIN D 25 HYDROXY (VIT D DEFICIENCY, FRACTURES): VITD: 40.41 ng/mL (ref 30.00–100.00)

## 2021-06-27 LAB — VITAMIN B12: Vitamin B-12: 669 pg/mL (ref 211–911)

## 2021-06-27 LAB — TSH: TSH: 2.92 u[IU]/mL (ref 0.35–5.50)

## 2021-06-27 MED ORDER — EZETIMIBE 10 MG PO TABS: 10.0000 mg | ORAL_TABLET | Freq: Every day | ORAL | 3 refills | Status: DC

## 2021-06-27 MED ORDER — LOSARTAN POTASSIUM 50 MG PO TABS
50.0000 mg | ORAL_TABLET | Freq: Every day | ORAL | 3 refills | Status: DC
Start: 2021-06-27 — End: 2022-07-18

## 2021-06-27 MED ORDER — ROSUVASTATIN CALCIUM 40 MG PO TABS
40.0000 mg | ORAL_TABLET | ORAL | 3 refills | Status: DC
Start: 2021-06-27 — End: 2022-01-28

## 2021-06-27 MED ORDER — DEXLANSOPRAZOLE 60 MG PO CPDR: 60.0000 mg | DELAYED_RELEASE_CAPSULE | Freq: Every day | ORAL | 3 refills | Status: DC

## 2021-06-27 MED ORDER — FLUTICASONE PROPIONATE 50 MCG/ACT NA SUSP: NASAL | 3 refills | Status: AC

## 2021-06-27 NOTE — Assessment & Plan Note (Signed)
Last vitamin D ?Lab Results  ?Component Value Date  ? VD25OH 27.34 (L) 12/12/2020  ? ?Low, to start oral replacement ? ?

## 2021-06-27 NOTE — Patient Instructions (Addendum)
Plesae consider the Shingles shot at the local pharmacy if covered by insurance. ? ?Please take all new medication as prescribed - the losartan and zetia ? ?Please continue all other medications as before, and refills have been done if requested. ? ?Please have the pharmacy call with any other refills you may need. ? ?Please continue your efforts at being more active, low cholesterol diet, and weight control. ? ?You are otherwise up to date with prevention measures today. ? ?Please keep your appointments with your specialists as you may have planned ? ?You will be contacted regarding the referral for: colonoscopy, and chest CT ? ?Please go to the LAB at the blood drawing area for the tests to be done ? ?You will be contacted by phone if any changes need to be made immediately.  Otherwise, you will receive a letter about your results with an explanation, but please check with MyChart first. ? ?Please remember to sign up for MyChart if you have not done so, as this will be important to you in the future with finding out test results, communicating by private email, and scheduling acute appointments online when needed. ? ?Please make an Appointment to return in 6 months, or sooner if needed ? ? ? ? ?

## 2021-06-27 NOTE — Progress Notes (Signed)
Patient ID: Kevin Farley, male   DOB: 10/23/1955, 66 y.o.   MRN: [id number]         Chief Complaint:: wellness exam and f/u htn, hld, low vit d, and recent cardiac CT score with abnormal chest finding       HPI:  Kevin Farley is a 66 y.o. male here for wellness exam; due for colonoscopy, declnies shingrix and tdap for now, o/w up to date.  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  Has 25 horses with plans for 40 at his home, boards them.                Also not actually taking zetia or losartan, he just wasn't convinced he really needed these.  Is taking vit D 2000 u qd.  Did have recent cardiac CT score of 55 , but also chest imaging found to have LUL nodule and possible hazy right lung infiltrates,.  Pt had recent cough now resolved, and has traveled to [place] about that time.  Also mentions mld right lateral knee pain without swelling, instability, giveaways or falls after working close to 15 hrs on the home property mostly standing and walking, ongoing for 3 days, better with alleve, no swelling, fever.  No prior hx of same.  Pt denies chest pain, increased sob or doe, wheezing, orthopnea, PND, increased LE swelling, palpitations, dizziness or syncope.  Pt denies polydipsia, polyuria, or new focal neuro s/s.    Pt denies fever, wt loss, night sweats, loss of appetite, or other constitutional symptoms    Wt Readings from Last 3 Encounters:  06/27/21 143 lb (64.9 kg)  05/08/21 142 lb (64.4 kg)  01/10/21 144 lb 11.2 oz (65.6 kg)   BP Readings from Last 3 Encounters:  06/27/21 (!) 158/88  05/08/21 122/70  12/18/20 140/80   Immunization History  Administered Date(s) Administered   Influenza Split 11/08/2011   Influenza,inj,Quad PF,6+ Mos 10/21/2012, 10/27/2013, 01/20/2015, 01/09/2016, 11/21/2016, 12/16/2017, 10/30/2018, 11/09/2020   Influenza-Unspecified 11/21/2016, 02/25/2020   PFIZER(Purple Top)SARS-COV-2 Vaccination 04/23/2019, 05/17/2019, 02/08/2020   Tdap 10/19/2010    Health Maintenance Due  Topic Date Due   COLONOSCOPY (Pts 45-33yr Insurance coverage will need to be confirmed)  01/27/2018      Past Medical History:  Diagnosis Date   Allergic rhinitis    Colon polyps    adenomatous 2011 & 2014   Family history of ischemic heart disease    GERD (gastroesophageal reflux disease)    HTN (hypertension) 04/16/2019   Hyperlipidemia    Other abnormal glucose    A1c 6% in 06/2009   Supraspinatus tendon tear    Past Surgical History:  Procedure Laterality Date   BACK SURGERY  2006   Dr RCarloyn Manner [place]   cardic catherization  2005   negative; Dr HLyla Son  colon polyps  2011 & 2014   Dr.Jacobs    CORNEAL TRANSPLANT Left 1993   Dr SKathrin Penner  nasal bone surgery  2007   UPPER GASTROINTESTINAL ENDOSCOPY  2011   mild gastritis; Dr JArdis Hughs   reports that he has never smoked. He has never used smokeless tobacco. He reports current alcohol use of about 1.0 standard drink per week. He reports that he does not use drugs. family history includes Diabetes in his brother, brother, father, mother, and sister; Heart attack in his brother and father; Hypertension in his brother, father, mother, and sister; Kidney failure in his brother and brother; Stroke in his mother;  Transient ischemic attack in his brother. Allergies  Allergen Reactions   Aspirin     REACTION: hives States tolerates IBU & acetaminophen OK   Other     Walnuts & pecans cause hives   Codeine     Itching with codeine containing cough syrup 9/15   Pecan Nut (Diagnostic) Hives   Zetia [Ezetimibe]     See 09/22/14 musculoskeletal pains   Ezetimibe-Simvastatin Hives    REACTION: myalgia   Current Outpatient Medications on File Prior to Visit  Medication Sig Dispense Refill   Blood Glucose Monitoring Suppl (ONE TOUCH ULTRA 2) w/Device KIT Use as directed once daily E11.9 1 kit 0   Cholecalciferol (THERA-D 2000) 50 MCG (2000 UT) TABS 1 tab by mouth once daily 30 tablet 99   Coenzyme Q10 (CO Q-10)  100 MG CAPS Take 1 capsule by mouth daily.     Epinastine HCl 0.05 % ophthalmic solution SMARTSIG:1 Drop(s) In Eye(s) Twice Daily PRN     Flaxseed, Linseed, (FLAX SEED OIL PO) Take by mouth.     glucose blood (ONETOUCH ULTRA) test strip Use as instructed once daily E11.9 100 each 12   hydrocortisone (ANUSOL-HC) 2.5 % rectal cream PLACE 1 APPLICATION RECTALLY 2 (TWO) TIMES DAILY 30 g 2   hydrocortisone cream 1 % Apply 1 application topically 2 (two) times daily. 30 g 0   Lancets MISC Use as directed once daily E11.9 100 each 3   Multiple Vitamin (MULTIVITAMIN) tablet Take 1 tablet by mouth daily.     Omega-3 Fatty Acids (FISH OIL) 500 MG CAPS Take 1 capsule by mouth daily.     sildenafil (VIAGRA) 100 MG tablet Take 0.5-1 tablets (50-100 mg total) by mouth daily as needed for erectile dysfunction. 5 tablet 11   triamcinolone cream (KENALOG) 0.1 % APPLY TO AFFECTED AREA TWICE A DAY 30 g 0   LORazepam (ATIVAN) 0.5 MG tablet TAKE 1 TABLET BY MOUTH EVERY 8 TO 12 HOURS AS NEEDED (Patient not taking: Reported on 01/10/2021) 30 tablet 2   [DISCONTINUED] Lansoprazole (PREVACID PO) Take by mouth daily.       [DISCONTINUED] omeprazole (PRILOSEC) 40 MG capsule Take 1 capsule (40 mg total) by mouth daily. 180 capsule 1   No current facility-administered medications on file prior to visit.        ROS:  All others reviewed and negative.  Objective        PE:  BP (!) 158/88 (BP Location: Right Arm, Patient Position: Sitting, Cuff Size: Normal)   Pulse 69   Temp 98.4 F (36.9 C) (Oral)   Ht _0  (1.676 m)   Wt 143 lb (64.9 kg)   SpO2 98%   BMI 23.08 kg/m                 Constitutional: Pt appears in NAD               HENT: Head: NCAT.                Right Ear: External ear normal.                 Left Ear: External ear normal.                Eyes: . Pupils are equal, round, and reactive to light. Conjunctivae and EOM are normal               Nose: without d/c or deformity  Neck: Neck  supple. Gross normal ROM               Cardiovascular: Normal rate and regular rhythm.                 Pulmonary/Chest: Effort normal and breath sounds without rales or wheezing.                Abd:  Soft, NT, ND, + BS, no organomegaly               Neurological: Pt is alert. At baseline orientation, motor grossly intact               Skin: Skin is warm. No rashes, no other new lesions, LE edema - none               Psychiatric: Pt behavior is normal without agitation   Micro: none  Cardiac tracings I have personally interpreted today:  none  Pertinent Radiological findings (summarize): none   Lab Results  Component Value Date   WBC 3.6 (L) 06/27/2021   HGB 13.3 06/27/2021   HCT 40.5 06/27/2021   PLT 170.0 06/27/2021   GLUCOSE 114 (H) 06/27/2021   CHOL 233 (H) 06/27/2021   TRIG 155.0 (H) 06/27/2021   HDL 67.70 06/27/2021   LDLDIRECT 150.0 11/18/2019   LDLCALC 134 (H) 06/27/2021   ALT 21 06/27/2021   AST 17 06/27/2021   NA 141 06/27/2021   K 4.2 06/27/2021   CL 104 06/27/2021   CREATININE 1.06 06/27/2021   BUN 18 06/27/2021   CO2 31 06/27/2021   TSH 2.92 06/27/2021   PSA 1.77 06/27/2021   HGBA1C 6.0 06/27/2021   Assessment/Plan:  CHRYSTIAN CUPPLES is a 66 y.o. Other or two or more races [6] male with  has a past medical history of Allergic rhinitis, Colon polyps, Family history of ischemic heart disease, GERD (gastroesophageal reflux disease), HTN (hypertension) (04/16/2019), Hyperlipidemia, Other abnormal glucose, and Supraspinatus tendon tear.  Vitamin D deficiency Last vitamin D Lab Results  Component Value Date   VD25OH 27.34 (L) 12/12/2020   Low, to start oral replacement   Encounter for well adult exam with abnormal findings Age and sex appropriate education and counseling updated with regular exercise and diet Referrals for preventative services - for colonoscopy Immunizations addressed - declines shingrix, tdap Smoking counseling  - none needed Evidence for  depression or other mood disorder - none significant Most recent labs reviewed. I have personally reviewed and have noted: 1) the patient's medical and social history 2) The patient's current medications and supplements 3) The patient's height, weight, and BMI have been recorded in the chart   Abnormal finding on lung imaging Pt will need CT chest in f/u r/o persistent mass  HTN (hypertension) BP Readings from Last 3 Encounters:  06/27/21 (!) 158/88  05/08/21 122/70  12/18/20 140/80   Uncontrolled today, but pt states OK at home, pt advised to start the losartan   Hyperlipidemia Lab Results  Component Value Date   LDLCALC 134 (H) 06/27/2021   Uncontrolled, goal ldl < 100 for now, also for cardiac CT score to risk stratify,, pt to continue current statin crestor 40 and add zetia 10, and consider referral lipid clinic   Pre-diabetes Lab Results  Component Value Date   HGBA1C 6.0 06/27/2021   Stable, pt to continue current medical treatment  - diet  Followup: Return in about 6 months (around 12/28/2021).  Cathlean Cower, MD 07/01/2021 6:59 AM [facility]

## 2021-07-01 ENCOUNTER — Encounter: Payer: Self-pay | Admitting: Internal Medicine

## 2021-07-01 NOTE — Assessment & Plan Note (Signed)
Lab Results  Component Value Date   LDLCALC 134 (H) 06/27/2021   Uncontrolled, goal ldl < 100 for now, also for cardiac CT score to risk stratify,, pt to continue current statin crestor 40 and add zetia 10, and consider referral lipid clinic

## 2021-07-01 NOTE — Assessment & Plan Note (Signed)
BP Readings from Last 3 Encounters:  06/27/21 (!) 158/88  05/08/21 122/70  12/18/20 140/80   Uncontrolled today, but pt states OK at home, pt advised to start the losartan

## 2021-07-01 NOTE — Assessment & Plan Note (Signed)
Lab Results  Component Value Date   HGBA1C 6.0 06/27/2021   Stable, pt to continue current medical treatment  - diet

## 2021-07-01 NOTE — Assessment & Plan Note (Signed)
Age and sex appropriate education and counseling updated with regular exercise and diet Referrals for preventative services - for colonoscopy Immunizations addressed - declines shingrix, tdap Smoking counseling  - none needed Evidence for depression or other mood disorder - none significant Most recent labs reviewed. I have personally reviewed and have noted: 1) the patient's medical and social history 2) The patient's current medications and supplements 3) The patient's height, weight, and BMI have been recorded in the chart

## 2021-07-01 NOTE — Assessment & Plan Note (Signed)
Pt will need CT chest in f/u r/o persistent mass

## 2021-07-10 ENCOUNTER — Ambulatory Visit: Payer: BC Managed Care – PPO | Admitting: Dietician

## 2021-07-11 ENCOUNTER — Ambulatory Visit: Payer: Self-pay

## 2021-07-11 ENCOUNTER — Ambulatory Visit (INDEPENDENT_AMBULATORY_CARE_PROVIDER_SITE_OTHER): Payer: BC Managed Care – PPO

## 2021-07-11 ENCOUNTER — Ambulatory Visit (INDEPENDENT_AMBULATORY_CARE_PROVIDER_SITE_OTHER): Payer: BC Managed Care – PPO | Admitting: Family Medicine

## 2021-07-11 VITALS — BP 140/84 | HR 73 | Ht 66.0 in | Wt 140.0 lb

## 2021-07-11 DIAGNOSIS — M25561 Pain in right knee: Secondary | ICD-10-CM

## 2021-07-11 DIAGNOSIS — G8929 Other chronic pain: Secondary | ICD-10-CM

## 2021-07-11 DIAGNOSIS — M76891 Other specified enthesopathies of right lower limb, excluding foot: Secondary | ICD-10-CM | POA: Diagnosis not present

## 2021-07-11 NOTE — Patient Instructions (Signed)
Thank you for coming in today.   Please get an Xray today before you leave   Please use Voltaren gel (Generic Diclofenac Gel) up to 4x daily for pain as needed.  This is available over-the-counter as both the name brand Voltaren gel and the generic diclofenac gel.   I recommend you obtained a compression sleeve to help with your joint problems. There are many options on the market however I recommend obtaining a full knee Body Helix compression sleeve.  You can find information (including how to appropriate measure yourself for sizing) can be found at www.Body http://www.[facility].com/.  Many of these products are health savings account (HSA) eligible.   You can use the compression sleeve at any time throughout the day but is most important to use while being active as well as for 2 hours post-activity.   It is appropriate to ice following activity with the compression sleeve in place.   Recheck as needed.   We can do an injection as needed.

## 2021-07-11 NOTE — Progress Notes (Unsigned)
   Rito Ehrlich, am serving as a Education administrator for Dr. Lynne Leader.  Subjective:    CC: R knee pain  HPI: Pt is a 66 y/o male c/o R knee pain. Pt was previously seen by Dr. Georgina Snell on 2021 for B wrist and thumb pain. Today, pt c/o R knee pain x 2-3 months. Pt locates pain to Lateral R knee and radiates to the back of knee. Patient states the pain is throbbing in nature.   R knee swelling: no Mechanical symptoms: no only one pop Aggravates: when laying down Treatments tried: Aleve takes the pain away   Pertinent review of Systems: ***  Relevant historical information: ***   Objective:   There were no vitals filed for this visit. General: Well Developed, well nourished, and in no acute distress.   MSK: ***  Lab and Radiology Results No results found for this or any previous visit (from the past 72 hour(s)). No results found.    Impression and Recommendations:    Assessment and Plan: 66 y.o. male with ***.  PDMP not reviewed this encounter. No orders of the defined types were placed in this encounter.  No orders of the defined types were placed in this encounter.   Discussed warning signs or symptoms. Please see discharge instructions. Patient expresses understanding.   ***

## 2021-07-13 NOTE — Progress Notes (Signed)
Right knee x-ray does not show much arthritis.  Plan on treatment plan we discussed in clinic.

## 2021-07-16 ENCOUNTER — Ambulatory Visit: Payer: BC Managed Care – PPO | Admitting: Skilled Nursing Facility1

## 2021-07-24 ENCOUNTER — Ambulatory Visit
Admission: RE | Admit: 2021-07-24 | Discharge: 2021-07-24 | Disposition: A | Discharge status: Home / Self Care | Payer: BC Managed Care – PPO | Source: Ambulatory Visit | Attending: Internal Medicine | Admitting: Internal Medicine

## 2021-07-24 DIAGNOSIS — R911 Solitary pulmonary nodule: Secondary | ICD-10-CM

## 2021-09-23 ENCOUNTER — Other Ambulatory Visit: Payer: Self-pay | Admitting: Internal Medicine

## 2021-12-28 ENCOUNTER — Encounter: Payer: Self-pay | Admitting: Gastroenterology

## 2021-12-28 ENCOUNTER — Ambulatory Visit (INDEPENDENT_AMBULATORY_CARE_PROVIDER_SITE_OTHER): Payer: BC Managed Care – PPO | Admitting: Internal Medicine

## 2021-12-28 ENCOUNTER — Encounter: Payer: Self-pay | Admitting: Internal Medicine

## 2021-12-28 VITALS — BP 154/90 | HR 71 | Temp 98.2°F | Ht 66.0 in | Wt 139.0 lb

## 2021-12-28 DIAGNOSIS — R413 Other amnesia: Secondary | ICD-10-CM | POA: Diagnosis not present

## 2021-12-28 DIAGNOSIS — E782 Mixed hyperlipidemia: Secondary | ICD-10-CM

## 2021-12-28 DIAGNOSIS — R251 Tremor, unspecified: Secondary | ICD-10-CM | POA: Diagnosis not present

## 2021-12-28 DIAGNOSIS — E559 Vitamin D deficiency, unspecified: Secondary | ICD-10-CM | POA: Diagnosis not present

## 2021-12-28 DIAGNOSIS — I10 Essential (primary) hypertension: Secondary | ICD-10-CM

## 2021-12-28 DIAGNOSIS — Z23 Encounter for immunization: Secondary | ICD-10-CM | POA: Diagnosis not present

## 2021-12-28 DIAGNOSIS — K649 Unspecified hemorrhoids: Secondary | ICD-10-CM | POA: Insufficient documentation

## 2021-12-28 DIAGNOSIS — R7303 Prediabetes: Secondary | ICD-10-CM | POA: Diagnosis not present

## 2021-12-28 LAB — BASIC METABOLIC PANEL
BUN: 19 mg/dL (ref 6–23)
CO2: 31 mEq/L (ref 19–32)
Calcium: 9.2 mg/dL (ref 8.4–10.5)
Chloride: 103 mEq/L (ref 96–112)
Creatinine, Ser: 1.12 mg/dL (ref 0.40–1.50)
GFR: 69.17 mL/min (ref >60.00)
Glucose, Bld: 109 mg/dL — ABNORMAL HIGH (ref 70–99)
Potassium: 4.3 mEq/L (ref 3.5–5.1)
Sodium: 139 mEq/L (ref 135–145)

## 2021-12-28 LAB — HEPATIC FUNCTION PANEL
ALT: 26 U/L (ref 0–53)
AST: 24 U/L (ref 0–37)
Albumin: 4.7 g/dL (ref 3.5–5.2)
Alkaline Phosphatase: 46 U/L (ref 39–117)
Bilirubin, Direct: 0.1 mg/dL (ref 0.0–0.3)
Total Bilirubin: 0.5 mg/dL (ref 0.2–1.2)
Total Protein: 7.6 g/dL (ref 6.0–8.3)

## 2021-12-28 LAB — CBC WITH DIFFERENTIAL/PLATELET
Basophils Absolute: 0 10*3/uL (ref 0.0–0.1)
Basophils Relative: 0.6 % (ref 0.0–3.0)
Eosinophils Absolute: 0.1 10*3/uL (ref 0.0–0.7)
Eosinophils Relative: 1.4 % (ref 0.0–5.0)
HCT: 41.9 % (ref 39.0–52.0)
Hemoglobin: 14 g/dL (ref 13.0–17.0)
Lymphocytes Relative: 30.6 % (ref 12.0–46.0)
Lymphs Abs: 1.3 10*3/uL (ref 0.7–4.0)
MCHC: 33.4 g/dL (ref 30.0–36.0)
MCV: 85.5 fl (ref 78.0–100.0)
Monocytes Absolute: 0.4 10*3/uL (ref 0.1–1.0)
Monocytes Relative: 9.9 % (ref 3.0–12.0)
Neutro Abs: 2.5 10*3/uL (ref 1.4–7.7)
Neutrophils Relative %: 57.5 % (ref 43.0–77.0)
Platelets: 189 10*3/uL (ref 150.0–400.0)
RBC: 4.9 Mil/uL (ref 4.22–5.81)
RDW: 13.3 % (ref 11.5–15.5)
WBC: 4.3 10*3/uL (ref 4.0–10.5)

## 2021-12-28 LAB — LIPID PANEL
Cholesterol: 242 mg/dL — ABNORMAL HIGH (ref 0–200)
HDL: 68.1 mg/dL (ref >39.00)
LDL Cholesterol: 144 mg/dL — ABNORMAL HIGH (ref 0–99)
NonHDL: 173.77
Total CHOL/HDL Ratio: 4
Triglycerides: 151 mg/dL — ABNORMAL HIGH (ref 0.0–149.0)
VLDL: 30.2 mg/dL (ref 0.0–40.0)

## 2021-12-28 LAB — HEMOGLOBIN A1C: Hgb A1c MFr Bld: 6.3 % (ref 4.6–6.5)

## 2021-12-28 LAB — VITAMIN D 25 HYDROXY (VIT D DEFICIENCY, FRACTURES): VITD: 57.39 ng/mL (ref 30.00–100.00)

## 2021-12-28 MED ORDER — HYDROCORTISONE ACETATE 25 MG RE SUPP: 25.0000 mg | Freq: Two times a day (BID) | RECTAL | 1 refills | Status: DC

## 2021-12-28 NOTE — Assessment & Plan Note (Signed)
Mild to mod, for anusol HC supp, refer GI, to f/u any worsening symptoms or concerns

## 2021-12-28 NOTE — Assessment & Plan Note (Signed)
C/w subjective abnormal name delayed recall, ok to follow

## 2021-12-28 NOTE — Patient Instructions (Addendum)
You had the flu shot today  Please have your Shingrix (shingles) shots done at your local pharmacy.  Please take all new medication as prescribed - the hemorrhoid suppositories as needed  You will be contacted regarding the referral for: Dr Carlean [facility]  Please continue all other medications as before, and refills have been done if requested.  Please have the pharmacy call with any other refills you may need.  Please continue your efforts at being more active, low cholesterol diet, and weight control.  Please keep your appointments with your specialists as you may have planned  Please go to the LAB at the blood drawing area for the tests to be done  You will be contacted by phone if any changes need to be made immediately.  Otherwise, you will receive a letter about your results with an explanation, but please check with MyChart first.  Please remember to sign up for MyChart if you have not done so, as this will be important to you in the future with finding out test results, communicating by private email, and scheduling acute appointments online when needed.  Please make an Appointment to return in 6 months, or sooner if needed,

## 2021-12-28 NOTE — Assessment & Plan Note (Signed)
Lab Results  Component Value Date   LDLCALC 144 (H) 12/28/2021   Uncontrolled,  pt to restart current statin crestor 40 mg and zetai 10 mg

## 2021-12-28 NOTE — Progress Notes (Signed)
Patient ID: CELIA GIBBONS, male   DOB: January 17, 1956, 66 y.o.   MRN: [id number]        Chief Complaint: follow up HTN, HLD and pre DM, low vit d,, hemorrhoid, tremor, memory change       HPI:  LANCELOT ALYEA is a 66 y.o. male here overall doing ok, Pt denies chest pain, increased sob or doe, wheezing, orthopnea, PND, increased LE swelling, palpitations, dizziness or syncope.   Pt denies polydipsia, polyuria, or new focal neuro s/s.    Pt denies fever, wt loss, night sweats, loss of appetite, or other constitutional symptoms  Due for flu shot.  Will have shingrix at pharmacy.  Also has mod pain recurring hemorrhoid without bleeding, asks for referral since getting worse.  Also mentions for first time has noticed subjective difficulty with remembering recall of names with slight delay.  Also has involuntary movement of the right hand occasionally, none today that sound like fasculations of the index finger vs intention tremor.  Due for flu shot       Wt Readings from Last 3 Encounters:  12/28/21 139 lb (63 kg)  07/11/21 140 lb (63.5 kg)  06/27/21 143 lb (64.9 kg)   BP Readings from Last 3 Encounters:  12/28/21 (!) 154/90  07/11/21 140/84  06/27/21 (!) 158/88         Past Medical History:  Diagnosis Date   Allergic rhinitis    Colon polyps    adenomatous 2011 & 2014   Family history of ischemic heart disease    GERD (gastroesophageal reflux disease)    HTN (hypertension) 04/16/2019   Hyperlipidemia    Other abnormal glucose    A1c 6% in 06/2009   Supraspinatus tendon tear    Past Surgical History:  Procedure Laterality Date   BACK SURGERY  2006   Dr Carloyn Manner, [place]   cardic catherization  2005   negative; Dr Lyla Son   colon polyps  2011 & 2014   Dr.Jacobs    CORNEAL TRANSPLANT Left 1993   Dr Kathrin Penner   nasal bone surgery  2007   UPPER GASTROINTESTINAL ENDOSCOPY  2011   mild gastritis; Dr Ardis Hughs    reports that he has never smoked. He has never used smokeless tobacco. He reports current  alcohol use of about 1.0 standard drink of alcohol per week. He reports that he does not use drugs. family history includes Diabetes in his brother, brother, father, mother, and sister; Heart attack in his brother and father; Hypertension in his brother, father, mother, and sister; Kidney failure in his brother and brother; Stroke in his mother; Transient ischemic attack in his brother. Allergies  Allergen Reactions   Aspirin     REACTION: hives States tolerates IBU & acetaminophen OK   Other     Walnuts & pecans cause hives   Codeine     Itching with codeine containing cough syrup 9/15   Pecan Nut (Diagnostic) Hives   Zetia [Ezetimibe]     See 09/22/14 musculoskeletal pains   Ezetimibe-Simvastatin Hives    REACTION: myalgia   Current Outpatient Medications on File Prior to Visit  Medication Sig Dispense Refill   Blood Glucose Monitoring Suppl (ONE TOUCH ULTRA 2) w/Device KIT Use as directed once daily E11.9 1 kit 0   Cholecalciferol (THERA-D 2000) 50 MCG (2000 UT) TABS 1 tab by mouth once daily 30 tablet 99   Coenzyme Q10 (CO Q-10) 100 MG CAPS Take 1 capsule by mouth daily.  dexlansoprazole (DEXILANT) 60 MG capsule Take 1 capsule (60 mg total) by mouth daily. 90 capsule 3   Epinastine HCl 0.05 % ophthalmic solution SMARTSIG:1 Drop(s) In Eye(s) Twice Daily PRN     ezetimibe (ZETIA) 10 MG tablet Take 1 tablet (10 mg total) by mouth daily. 90 tablet 3   Flaxseed, Linseed, (FLAX SEED OIL PO) Take by mouth.     fluticasone (FLONASE) 50 MCG/ACT nasal spray SPRAY 2 SPRAYS INTO EACH NOSTRIL DAILY 48 mL 3   glucose blood (ONETOUCH ULTRA) test strip Use as instructed once daily E11.9 100 each 12   hydrocortisone (ANUSOL-HC) 2.5 % rectal cream PLACE 1 APPLICATION RECTALLY 2 (TWO) TIMES DAILY 30 g 2   hydrocortisone cream 1 % Apply 1 application topically 2 (two) times daily. 30 g 0   Lancets MISC Use as directed once daily E11.9 100 each 3   LORazepam (ATIVAN) 0.5 MG tablet TAKE 1 TABLET BY  MOUTH EVERY 8 TO 12 HOURS AS NEEDED 30 tablet 2   losartan (COZAAR) 50 MG tablet Take 1 tablet (50 mg total) by mouth daily. 90 tablet 3   Multiple Vitamin (MULTIVITAMIN) tablet Take 1 tablet by mouth daily.     Omega-3 Fatty Acids (FISH OIL) 500 MG CAPS Take 1 capsule by mouth daily.     rosuvastatin (CRESTOR) 40 MG tablet Take 1 tablet (40 mg total) by mouth every other day. 45 tablet 3   sildenafil (VIAGRA) 100 MG tablet Take 0.5-1 tablets (50-100 mg total) by mouth daily as needed for erectile dysfunction. 5 tablet 11   triamcinolone cream (KENALOG) 0.1 % APPLY TO AFFECTED AREA TWICE A DAY 30 g 0   [DISCONTINUED] Lansoprazole (PREVACID PO) Take by mouth daily.       [DISCONTINUED] omeprazole (PRILOSEC) 40 MG capsule Take 1 capsule (40 mg total) by mouth daily. 180 capsule 1   No current facility-administered medications on file prior to visit.        ROS:  All others reviewed and negative.  Objective        PE:  BP (!) 154/90 (BP Location: Left Arm, Patient Position: Sitting, Cuff Size: Large)   Pulse 71   Temp 98.2 F (36.8 C) (Oral)   Ht _0  (1.676 m)   Wt 139 lb (63 kg)   SpO2 96%   BMI 22.44 kg/m                 Constitutional: Pt appears in NAD               HENT: Head: NCAT.                Right Ear: External ear normal.                 Left Ear: External ear normal.                Eyes: . Pupils are equal, round, and reactive to light. Conjunctivae and EOM are normal               Nose: without d/c or deformity               Neck: Neck supple. Gross normal ROM               Cardiovascular: Normal rate and regular rhythm.                 Pulmonary/Chest: Effort normal and breath sounds without rales or wheezing.  Abd:  Soft, NT, ND, + BS, no organomegaly               Neurological: Pt is alert. At baseline orientation, motor grossly intact               Skin: Skin is warm. No rashes, no other new lesions, LE edema - none               Psychiatric: Pt  behavior is normal without agitation   Micro: none  Cardiac tracings I have personally interpreted today:  none  Pertinent Radiological findings (summarize): none   Lab Results  Component Value Date   WBC 4.3 12/28/2021   HGB 14.0 12/28/2021   HCT 41.9 12/28/2021   PLT 189.0 12/28/2021   GLUCOSE 109 (H) 12/28/2021   CHOL 242 (H) 12/28/2021   TRIG 151.0 (H) 12/28/2021   HDL 68.10 12/28/2021   LDLDIRECT 150.0 11/18/2019   LDLCALC 144 (H) 12/28/2021   ALT 26 12/28/2021   AST 24 12/28/2021   NA 139 12/28/2021   K 4.3 12/28/2021   CL 103 12/28/2021   CREATININE 1.12 12/28/2021   BUN 19 12/28/2021   CO2 31 12/28/2021   TSH 2.92 06/27/2021   PSA 1.77 06/27/2021   HGBA1C 6.3 12/28/2021   Assessment/Plan:  MOUSSA WIEGAND is a 66 y.o. Other or two or more races [6] male with  has a past medical history of Allergic rhinitis, Colon polyps, Family history of ischemic heart disease, GERD (gastroesophageal reflux disease), HTN (hypertension) (04/16/2019), Hyperlipidemia, Other abnormal glucose, and Supraspinatus tendon tear.  Vitamin D deficiency Last vitamin D Lab Results  Component Value Date   VD25OH 57.39 12/28/2021   Stable, cont oral replacement   Hyperlipidemia Lab Results  Component Value Date   LDLCALC 144 (H) 12/28/2021   Uncontrolled,  pt to restart current statin crestor 40 mg and zetai 10 mg   HTN (hypertension) BP Readings from Last 3 Encounters:  12/28/21 (!) 154/90  07/11/21 140/84  06/27/21 (!) 158/88   Uncontrolled, suspect some degree of med noncompliance,, pt to continue medical treatment losartan 50 mg qd   Tremor None observed today, continue to follow  Memory changes C/w subjective abnormal name delayed recall, ok to follow  Hemorrhoids Mild to mod, for anusol HC supp, refer GI, to f/u any worsening symptoms or concerns    Followup: Return in about 6 months (around 06/28/2022).  Cathlean Cower, MD 12/28/2021 9:35 PM [facility]

## 2021-12-28 NOTE — Assessment & Plan Note (Signed)
Last vitamin D Lab Results  Component Value Date   VD25OH 57.39 12/28/2021   Stable, cont oral replacement

## 2021-12-28 NOTE — Assessment & Plan Note (Signed)
None observed today, continue to follow

## 2021-12-28 NOTE — Assessment & Plan Note (Signed)
BP Readings from Last 3 Encounters:  12/28/21 (!) 154/90  07/11/21 140/84  06/27/21 (!) 158/88   Uncontrolled, suspect some degree of med noncompliance,, pt to continue medical treatment losartan 50 mg qd

## 2022-01-27 ENCOUNTER — Other Ambulatory Visit: Payer: Self-pay | Admitting: Internal Medicine

## 2022-01-27 NOTE — Telephone Encounter (Signed)
Please refill as per office routine med refill policy (all routine meds to be refilled for 3 mo or monthly (per pt preference) up to one year from last visit, then month to month grace period for 3 mo, then further med refills will have to be denied) ? ?

## 2022-02-01 ENCOUNTER — Ambulatory Visit: Payer: BLUE CROSS/BLUE SHIELD | Admitting: Physician Assistant

## 2022-02-12 ENCOUNTER — Encounter: Payer: Self-pay | Admitting: Gastroenterology

## 2022-02-12 ENCOUNTER — Ambulatory Visit (INDEPENDENT_AMBULATORY_CARE_PROVIDER_SITE_OTHER): Payer: BC Managed Care – PPO | Admitting: Gastroenterology

## 2022-02-12 VITALS — BP 130/68 | HR 97 | Ht 66.0 in | Wt 142.0 lb

## 2022-02-12 DIAGNOSIS — K642 Third degree hemorrhoids: Secondary | ICD-10-CM

## 2022-02-12 DIAGNOSIS — Z86010 Personal history of colonic polyps: Secondary | ICD-10-CM | POA: Diagnosis not present

## 2022-02-12 NOTE — Progress Notes (Signed)
 HPI : Kevin Farley is a very pleasant 66 year old male followed by Dr. Jacobs who is referred to us by Dr. James John for further evaluation management of symptomatic hemorrhoids.  Patient states that he has had symptoms from his hemorrhoids off-and-on for the past 10 years.  Symptoms consist of prolapsing, which sometimes requires manual reduction, occasional bleeding, itching and sometimes pressure-like pain/discomfort.  Sometimes he will go months with no symptoms, followed by several weeks of daily symptoms.  He was prescribed Anusol suppositories, but has not tried using them yet. He reports regular bowel movements, 1 every morning, and sometimes a second 1 later in the day.  His stools are formed and soft.  He denies problems with hard stools.  No straining.  He reports being on the toilet typically between 5-7 minutes for bowel movements.  No problems with diarrhea.  He denies pain with the passage of stool.  He denies problems with seepage or mucus.  He has a history of tubular adenomas, 3 were removed on his index colonoscopy in 2011, and 1 small tubular adenoma was removed on his last colonoscopy in 2014.  He was recommended to repeat colonoscopy in 5 years.     Colonoscopy Jan 27, 2013 (Dr. Jacobs) 1 small tubular adenoma in descending colon, recommended repeat in 5 years  EGD September 2011 Mild gastric erythema, otherwise normal; biopsies with chronic inactive gastritis, H. pylori negative  Colonoscopy February 2011 6 small polyps, 2-7 mm, internal hemorrhoids (2 tubular adenomas, 1 sessile serrated polyp, 3 hyperplastic polyps)  Past Medical History:  Diagnosis Date   Allergic rhinitis    Colon polyps    adenomatous 2011 & 2014   Family history of ischemic heart disease    GERD (gastroesophageal reflux disease)    HTN (hypertension) 04/16/2019   Hyperlipidemia    Other abnormal glucose    A1c 6% in 06/2009   Supraspinatus tendon tear      Past Surgical History:  Procedure  Laterality Date   BACK SURGERY  2006   Dr Roy, [place]   cardic catherization  2005   negative; Dr Hawani   colon polyps  2011 & 2014   Dr.Jacobs    CORNEAL TRANSPLANT Left 1993   Dr Stoneburner   nasal bone surgery  2007   UPPER GASTROINTESTINAL ENDOSCOPY  2011   mild gastritis; Dr Jacobs   Family History  Problem Relation Age of Onset   Hypertension Mother    Diabetes Mother    Stroke Mother        in 70s   Diabetes Father    Hypertension Father    Heart attack Father        had open heart surgery   Diabetes Brother        X2   Heart attack Brother         MI in 50s   Transient ischemic attack Brother        in 60s   Kidney failure Brother    Hypertension Brother    Diabetes Sister         X 2   Hypertension Sister    Kidney failure Brother        renal transplant   Diabetes Brother    COPD Neg Hx    Asthma Neg Hx    Cancer Neg Hx    Colon polyps Neg Hx    Social History   Tobacco Use   Smoking status: Never   Smokeless   tobacco: Never  Substance Use Topics   Alcohol use: Yes    Alcohol/week: 1.0 standard drink of alcohol    Types: 1 Glasses of wine per week   Drug use: No   Current Outpatient Medications  Medication Sig Dispense Refill   Blood Glucose Monitoring Suppl (ONE TOUCH ULTRA 2) w/Device KIT Use as directed once daily E11.9 1 kit 0   Cholecalciferol (THERA-D 2000) 50 MCG (2000 UT) TABS 1 tab by mouth once daily 30 tablet 99   Coenzyme Q10 (CO Q-10) 100 MG CAPS Take 1 capsule by mouth daily.     dexlansoprazole (DEXILANT) 60 MG capsule Take 1 capsule (60 mg total) by mouth daily. 90 capsule 3   Epinastine HCl 0.05 % ophthalmic solution SMARTSIG:1 Drop(s) In Eye(s) Twice Daily PRN     ezetimibe (ZETIA) 10 MG tablet Take 1 tablet (10 mg total) by mouth daily. 90 tablet 3   Flaxseed, Linseed, (FLAX SEED OIL PO) Take by mouth.     fluticasone (FLONASE) 50 MCG/ACT nasal spray SPRAY 2 SPRAYS INTO EACH NOSTRIL DAILY 48 mL 3   glucose blood (ONETOUCH ULTRA)  test strip Use as instructed once daily E11.9 100 each 12   hydrocortisone (ANUSOL-HC) 2.5 % rectal cream PLACE 1 APPLICATION RECTALLY 2 (TWO) TIMES DAILY 30 g 2   hydrocortisone (ANUSOL-HC) 25 MG suppository Place 1 suppository (25 mg total) rectally every 12 (twelve) hours. 12 suppository 1   hydrocortisone cream 1 % Apply 1 application topically 2 (two) times daily. 30 g 0   Lancets MISC Use as directed once daily E11.9 100 each 3   LORazepam (ATIVAN) 0.5 MG tablet TAKE 1 TABLET BY MOUTH EVERY 8 TO 12 HOURS AS NEEDED 30 tablet 2   losartan (COZAAR) 50 MG tablet Take 1 tablet (50 mg total) by mouth daily. 90 tablet 3   Multiple Vitamin (MULTIVITAMIN) tablet Take 1 tablet by mouth daily.     Omega-3 Fatty Acids (FISH OIL) 500 MG CAPS Take 1 capsule by mouth daily.     rosuvastatin (CRESTOR) 40 MG tablet TAKE 1 TABLET BY MOUTH EVERY OTHER DAY 45 tablet 3   sildenafil (VIAGRA) 100 MG tablet Take 0.5-1 tablets (50-100 mg total) by mouth daily as needed for erectile dysfunction. 5 tablet 11   triamcinolone cream (KENALOG) 0.1 % APPLY TO AFFECTED AREA TWICE A DAY 30 g 0   No current facility-administered medications for this visit.   Allergies  Allergen Reactions   Aspirin     REACTION: hives States tolerates IBU & acetaminophen OK   Other     Walnuts & pecans cause hives   Codeine     Itching with codeine containing cough syrup 9/15   Pecan Nut (Diagnostic) Hives   Zetia [Ezetimibe]     See 09/22/14 musculoskeletal pains   Ezetimibe-Simvastatin Hives    REACTION: myalgia     Review of Systems: All systems reviewed and negative except where noted in HPI.    No results found.  Physical Exam: BP 130/68   Pulse 97   Ht 5' 6" (1.676 m)   Wt 142 lb (64.4 kg)   SpO2 97%   BMI 22.92 kg/m  Constitutional: Pleasant,well-developed, Indian male in no acute distress. HEENT: Normocephalic and atraumatic. Conjunctivae are normal. No scleral icterus. Neck supple.  Cardiovascular: Normal  rate, regular rhythm.  Pulmonary/chest: Effort normal and breath sounds normal. No wheezing, rales or rhonchi. Abdominal: Soft, nondistended, nontender. Bowel sounds active throughout. There are no masses palpable.   No hepatomegaly. Extremities: no edema Rectal: Deferred until time of colonoscopy Neurological: Alert and oriented to person place and time. Skin: Skin is warm and dry. No rashes noted. Psychiatric: Normal mood and affect. Behavior is normal.  CBC    Component Value Date/Time   WBC 4.3 12/28/2021 1002   RBC 4.90 12/28/2021 1002   HGB 14.0 12/28/2021 1002   HCT 41.9 12/28/2021 1002   PLT 189.0 12/28/2021 1002   MCV 85.5 12/28/2021 1002   MCH 28.2 04/16/2019 1643   MCHC 33.4 12/28/2021 1002   RDW 13.3 12/28/2021 1002   LYMPHSABS 1.3 12/28/2021 1002   MONOABS 0.4 12/28/2021 1002   EOSABS 0.1 12/28/2021 1002   BASOSABS 0.0 12/28/2021 1002    CMP     Component Value Date/Time   NA 139 12/28/2021 1002   K 4.3 12/28/2021 1002   CL 103 12/28/2021 1002   CO2 31 12/28/2021 1002   GLUCOSE 109 (H) 12/28/2021 1002   BUN 19 12/28/2021 1002   CREATININE 1.12 12/28/2021 1002   CREATININE 1.09 04/16/2019 1643   CALCIUM 9.2 12/28/2021 1002   PROT 7.6 12/28/2021 1002   ALBUMIN 4.7 12/28/2021 1002   AST 24 12/28/2021 1002   ALT 26 12/28/2021 1002   ALKPHOS 46 12/28/2021 1002   BILITOT 0.5 12/28/2021 1002   GFRNONAA >60 06/29/2014 0855   GFRAA >60 06/29/2014 0855     ASSESSMENT AND PLAN: 66-year-old male with history consistent with grade 3 internal hemorrhoids, and history of colon polyps, overdue for surveillance colonoscopy.  We discussed the anatomy, pathophysiology and management of hemorrhoids.  I recommended he add a daily fiber supplement, and use Anusol suppositories for up to 2 weeks when his symptoms flare.  If his symptoms are not improving despite these interventions, we can consider hemorrhoid banding.  The patient was given a flyer with further information  regarding hemorrhoid banding. Will schedule patient for surveillance colonoscopy.  Will perform detailed anorectal exam at that time.  Grade III hemorrhoids - Fiber  - Anusol - Discussed banding  History of colon polyps - Colonoscopy  The details, risks (including bleeding, perforation, infection, missed lesions, medication reactions and possible hospitalization or surgery if complications occur), benefits, and alternatives to colonoscopy with possible biopsy and possible polypectomy were discussed with the patient and he consents to proceed.     E. , MD [facility]  John, James W, MD  

## 2022-02-12 NOTE — Patient Instructions (Signed)
_______________________________________________________  If you are age 67 or older, your body mass index should be between 23-30. Your Body mass index is 22.92 kg/m. If this is out of the aforementioned range listed, please consider follow up with your Primary Care Provider.  If you are age 30 or younger, your body mass index should be between 19-25. Your Body mass index is 22.92 kg/m. If this is out of the aformentioned range listed, please consider follow up with your Primary Care Provider.   It has been recommended to you by your physician that you have a(n) Colonoscopy completed. Per your request, we did not schedule the procedure(s) today. Please contact our office at [phone] should you decide to have the procedure completed. You will be scheduled for a pre-visit and procedure at that time.  Please purchase Metamucil over the counter. Take as directed.   Use Anusol suppositories as needed.   The [facility] providers would like to encourage you to use [facility] to communicate with providers for non-urgent requests or questions.  Due to long hold times on the telephone, sending your provider a message by [facility] may be a faster and more efficient way to get a response.  Please allow 48 business hours for a response.  Please remember that this is for non-urgent requests.   It was a pleasure to see you today!  Thank you for trusting me with your gastrointestinal care!    Scott E.Candis Schatz, MD

## 2022-02-20 DIAGNOSIS — H524 Presbyopia: Secondary | ICD-10-CM | POA: Diagnosis not present

## 2022-02-20 DIAGNOSIS — H52203 Unspecified astigmatism, bilateral: Secondary | ICD-10-CM | POA: Diagnosis not present

## 2022-02-20 DIAGNOSIS — H2513 Age-related nuclear cataract, bilateral: Secondary | ICD-10-CM | POA: Diagnosis not present

## 2022-04-15 ENCOUNTER — Other Ambulatory Visit: Payer: Self-pay

## 2022-04-15 ENCOUNTER — Telehealth: Payer: Self-pay | Admitting: Gastroenterology

## 2022-04-15 DIAGNOSIS — Z1211 Encounter for screening for malignant neoplasm of colon: Secondary | ICD-10-CM

## 2022-04-15 MED ORDER — NA SULFATE-K SULFATE-MG SULF 17.5-3.13-1.6 GM/177ML PO SOLN: 1.0000 | Freq: Once | ORAL | 0 refills | Status: AC

## 2022-04-15 NOTE — Telephone Encounter (Signed)
Inbound  call from patient wife, states they dont have instructions/medication for his up coming procedure on 3/6.Please advise

## 2022-04-15 NOTE — Telephone Encounter (Signed)
Prep instructions sent via mychart. Pts wife aware.

## 2022-04-15 NOTE — Telephone Encounter (Signed)
No I have not had any messages on him.  Looks like Dr C wanted him to have a colon at office visit but he called back to set that up.

## 2022-04-15 NOTE — Telephone Encounter (Signed)
Patty do you know anything about this pt? Scheduled for colon with mansouraty, see note below.

## 2022-04-15 NOTE — Telephone Encounter (Signed)
Prep script sent to pharmacy, amb ref entered in epic and prep instructions mailed to pt.

## 2022-04-16 ENCOUNTER — Encounter: Payer: BC Managed Care – PPO | Admitting: Gastroenterology

## 2022-04-17 ENCOUNTER — Encounter: Payer: Self-pay | Admitting: Gastroenterology

## 2022-04-17 ENCOUNTER — Ambulatory Visit (AMBULATORY_SURGERY_CENTER): Payer: Medicare Other | Admitting: Gastroenterology

## 2022-04-17 VITALS — BP 128/77 | HR 71 | Temp 98.2°F | Resp 14 | Ht 66.0 in | Wt 142.0 lb

## 2022-04-17 DIAGNOSIS — D124 Benign neoplasm of descending colon: Secondary | ICD-10-CM | POA: Diagnosis not present

## 2022-04-17 DIAGNOSIS — Z86010 Personal history of colonic polyps: Secondary | ICD-10-CM

## 2022-04-17 DIAGNOSIS — D128 Benign neoplasm of rectum: Secondary | ICD-10-CM

## 2022-04-17 DIAGNOSIS — D120 Benign neoplasm of cecum: Secondary | ICD-10-CM

## 2022-04-17 DIAGNOSIS — Z09 Encounter for follow-up examination after completed treatment for conditions other than malignant neoplasm: Secondary | ICD-10-CM

## 2022-04-17 DIAGNOSIS — K635 Polyp of colon: Secondary | ICD-10-CM | POA: Diagnosis not present

## 2022-04-17 DIAGNOSIS — D123 Benign neoplasm of transverse colon: Secondary | ICD-10-CM | POA: Diagnosis not present

## 2022-04-17 DIAGNOSIS — K621 Rectal polyp: Secondary | ICD-10-CM | POA: Diagnosis not present

## 2022-04-17 MED ORDER — SODIUM CHLORIDE 0.9 % IV SOLN: 500.0000 mL | Freq: Once | INTRAVENOUS | Status: DC

## 2022-04-17 NOTE — Progress Notes (Signed)
Called to room to assist during endoscopic procedure.  Patient ID and intended procedure confirmed with present staff. Received instructions for my participation in the procedure from the performing physician.  

## 2022-04-17 NOTE — Patient Instructions (Signed)
Resume previous high fiber diet and medications. Awaiting pathology results. Repeat Colonoscopy date to be determined based on pathology results. Continue FiberCon 1-2 tablets by mouth daily.  If rectal bleeding continues after fiber supplements and Anusol suppositories. Consider internal hemorrhoidal banding with Dr. Candis Schatz.  YOU HAD AN ENDOSCOPIC PROCEDURE TODAY AT THE [facility]:   Refer to the procedure report that was given to you for any specific questions about what was found during the examination.  If the procedure report does not answer your questions, please call your gastroenterologist to clarify.  If you requested that your care partner not be given the details of your procedure findings, then the procedure report has been included in a sealed envelope for you to review at your convenience later.  YOU SHOULD EXPECT: Some feelings of bloating in the abdomen. Passage of more gas than usual.  Walking can help get rid of the air that was put into your GI tract during the procedure and reduce the bloating. If you had a lower endoscopy (such as a colonoscopy or flexible sigmoidoscopy) you may notice spotting of blood in your stool or on the toilet paper. If you underwent a bowel prep for your procedure, you may not have a normal bowel movement for a few days.  Please Note:  You might notice some irritation and congestion in your nose or some drainage.  This is from the oxygen used during your procedure.  There is no need for concern and it should clear up in a day or so.  SYMPTOMS TO REPORT IMMEDIATELY:  Following lower endoscopy (colonoscopy or flexible sigmoidoscopy):  Excessive amounts of blood in the stool  Significant tenderness or worsening of abdominal pains  Swelling of the abdomen that is new, acute  Fever of 100F or higher  For urgent or emergent issues, a gastroenterologist can be reached at any hour by calling [phone]. Do not use MyChart messaging for  urgent concerns.    DIET:  We do recommend a small meal at first, but then you may proceed to your regular diet.  Drink plenty of fluids but you should avoid alcoholic beverages for 24 hours.  ACTIVITY:  You should plan to take it easy for the rest of today and you should NOT DRIVE or use heavy machinery until tomorrow (because of the sedation medicines used during the test).    FOLLOW UP: Our staff will call the number listed on your records the next business day following your procedure.  We will call around 7:15- 8:00 am to check on you and address any questions or concerns that you may have regarding the information given to you following your procedure. If we do not reach you, we will leave a message.     If any biopsies were taken you will be contacted by phone or by letter within the next 1-3 weeks.  Please call us at [phone] if you have not heard about the biopsies in 3 weeks.    SIGNATURES/CONFIDENTIALITY: You and/or your care partner have signed paperwork which will be entered into your electronic medical record.  These signatures attest to the fact that that the information above on your After Visit Summary has been reviewed and is understood.  Full responsibility of the confidentiality of this discharge information lies with you and/or your care-partner.

## 2022-04-17 NOTE — Progress Notes (Signed)
GASTROENTEROLOGY PROCEDURE H&P NOTE   Primary Care Physician: Biagio Borg, MD  HPI: Kevin Farley is a 67 y.o. male who presents for Surveillance Colonoscopy.  Past Medical History:  Diagnosis Date   Allergic rhinitis    Colon polyps    adenomatous 2011 & 2014   Family history of ischemic heart disease    GERD (gastroesophageal reflux disease)    HTN (hypertension) 04/16/2019   Hyperlipidemia    Other abnormal glucose    A1c 6% in 06/2009   Supraspinatus tendon tear    Past Surgical History:  Procedure Laterality Date   BACK SURGERY  2006   Dr Carloyn Manner, [place]   cardic catherization  2005   negative; Dr Lyla Son   colon polyps  2011 & 2014   Dr.Jacobs    CORNEAL TRANSPLANT Left 1993   Dr Kathrin Penner   nasal bone surgery  2007   UPPER GASTROINTESTINAL ENDOSCOPY  2011   mild gastritis; Dr Ardis Hughs   Current Outpatient Medications  Medication Sig Dispense Refill   Blood Glucose Monitoring Suppl (ONE TOUCH ULTRA 2) w/Device KIT Use as directed once daily E11.9 1 kit 0   Cholecalciferol (THERA-D 2000) 50 MCG (2000 UT) TABS 1 tab by mouth once daily 30 tablet 99   Coenzyme Q10 (CO Q-10) 100 MG CAPS Take 1 capsule by mouth daily.     dexlansoprazole (DEXILANT) 60 MG capsule Take 1 capsule (60 mg total) by mouth daily. 90 capsule 3   Epinastine HCl 0.05 % ophthalmic solution SMARTSIG:1 Drop(s) In Eye(s) Twice Daily PRN     ezetimibe (ZETIA) 10 MG tablet Take 1 tablet (10 mg total) by mouth daily. 90 tablet 3   Flaxseed, Linseed, (FLAX SEED OIL PO) Take by mouth.     fluticasone (FLONASE) 50 MCG/ACT nasal spray SPRAY 2 SPRAYS INTO EACH NOSTRIL DAILY 48 mL 3   glucose blood (ONETOUCH ULTRA) test strip Use as instructed once daily E11.9 100 each 12   hydrocortisone (ANUSOL-HC) 2.5 % rectal cream PLACE 1 APPLICATION RECTALLY 2 (TWO) TIMES DAILY 30 g 2   hydrocortisone (ANUSOL-HC) 25 MG suppository Place 1 suppository (25 mg total) rectally every 12 (twelve) hours. 12 suppository 1    hydrocortisone cream 1 % Apply 1 application topically 2 (two) times daily. 30 g 0   Lancets MISC Use as directed once daily E11.9 100 each 3   LORazepam (ATIVAN) 0.5 MG tablet TAKE 1 TABLET BY MOUTH EVERY 8 TO 12 HOURS AS NEEDED 30 tablet 2   losartan (COZAAR) 50 MG tablet Take 1 tablet (50 mg total) by mouth daily. 90 tablet 3   Multiple Vitamin (MULTIVITAMIN) tablet Take 1 tablet by mouth daily.     Omega-3 Fatty Acids (FISH OIL) 500 MG CAPS Take 1 capsule by mouth daily.     rosuvastatin (CRESTOR) 40 MG tablet TAKE 1 TABLET BY MOUTH EVERY OTHER DAY 45 tablet 3   sildenafil (VIAGRA) 100 MG tablet Take 0.5-1 tablets (50-100 mg total) by mouth daily as needed for erectile dysfunction. 5 tablet 11   triamcinolone cream (KENALOG) 0.1 % APPLY TO AFFECTED AREA TWICE A DAY 30 g 0   No current facility-administered medications for this visit.    Current Outpatient Medications:    Blood Glucose Monitoring Suppl (ONE TOUCH ULTRA 2) w/Device KIT, Use as directed once daily E11.9, Disp: 1 kit, Rfl: 0   Cholecalciferol (THERA-D 2000) 50 MCG (2000 UT) TABS, 1 tab by mouth once daily, Disp: 30 tablet, Rfl:  99   Coenzyme Q10 (CO Q-10) 100 MG CAPS, Take 1 capsule by mouth daily., Disp: , Rfl:    dexlansoprazole (DEXILANT) 60 MG capsule, Take 1 capsule (60 mg total) by mouth daily., Disp: 90 capsule, Rfl: 3   Epinastine HCl 0.05 % ophthalmic solution, SMARTSIG:1 Drop(s) In Eye(s) Twice Daily PRN, Disp: , Rfl:    ezetimibe (ZETIA) 10 MG tablet, Take 1 tablet (10 mg total) by mouth daily., Disp: 90 tablet, Rfl: 3   Flaxseed, Linseed, (FLAX SEED OIL PO), Take by mouth., Disp: , Rfl:    fluticasone (FLONASE) 50 MCG/ACT nasal spray, SPRAY 2 SPRAYS INTO EACH NOSTRIL DAILY, Disp: 48 mL, Rfl: 3   glucose blood (ONETOUCH ULTRA) test strip, Use as instructed once daily E11.9, Disp: 100 each, Rfl: 12   hydrocortisone (ANUSOL-HC) 2.5 % rectal cream, PLACE 1 APPLICATION RECTALLY 2 (TWO) TIMES DAILY, Disp: 30 g, Rfl: 2    hydrocortisone (ANUSOL-HC) 25 MG suppository, Place 1 suppository (25 mg total) rectally every 12 (twelve) hours., Disp: 12 suppository, Rfl: 1   hydrocortisone cream 1 %, Apply 1 application topically 2 (two) times daily., Disp: 30 g, Rfl: 0   Lancets MISC, Use as directed once daily E11.9, Disp: 100 each, Rfl: 3   LORazepam (ATIVAN) 0.5 MG tablet, TAKE 1 TABLET BY MOUTH EVERY 8 TO 12 HOURS AS NEEDED, Disp: 30 tablet, Rfl: 2   losartan (COZAAR) 50 MG tablet, Take 1 tablet (50 mg total) by mouth daily., Disp: 90 tablet, Rfl: 3   Multiple Vitamin (MULTIVITAMIN) tablet, Take 1 tablet by mouth daily., Disp: , Rfl:    Omega-3 Fatty Acids (FISH OIL) 500 MG CAPS, Take 1 capsule by mouth daily., Disp: , Rfl:    rosuvastatin (CRESTOR) 40 MG tablet, TAKE 1 TABLET BY MOUTH EVERY OTHER DAY, Disp: 45 tablet, Rfl: 3   sildenafil (VIAGRA) 100 MG tablet, Take 0.5-1 tablets (50-100 mg total) by mouth daily as needed for erectile dysfunction., Disp: 5 tablet, Rfl: 11   triamcinolone cream (KENALOG) 0.1 %, APPLY TO AFFECTED AREA TWICE A DAY, Disp: 30 g, Rfl: 0 Allergies  Allergen Reactions   Aspirin     REACTION: hives States tolerates IBU & acetaminophen OK   Other     Walnuts & pecans cause hives   Codeine     Itching with codeine containing cough syrup 9/15   Pecan Nut (Diagnostic) Hives   Zetia [Ezetimibe]     See 09/22/14 musculoskeletal pains   Ezetimibe-Simvastatin Hives    REACTION: myalgia   Family History  Problem Relation Age of Onset   Hypertension Mother    Diabetes Mother    Stroke Mother        in 76s   Diabetes Father    Hypertension Father    Heart attack Father        had open heart surgery   Diabetes Brother        X2   Heart attack Brother         MI in 38s   Transient ischemic attack Brother        in 40s   Kidney failure Brother    Hypertension Brother    Diabetes Sister         X 2   Hypertension Sister    Kidney failure Brother        renal transplant   Diabetes  Brother    COPD Neg Hx    Asthma Neg Hx    Cancer Neg  Hx    Colon polyps Neg Hx    Social History   Socioeconomic History   Marital status: Married    Spouse name: Not on file   Number of children: 3   Years of education: 16   Highest education level: Not on file  Occupational History   Occupation: Freight forwarder of hotels    Occupation: OWNER    Employer: [employer]  Tobacco Use   Smoking status: Never   Smokeless tobacco: Never  Substance and Sexual Activity   Alcohol use: Yes    Alcohol/week: 1.0 standard drink of alcohol    Types: 1 Glasses of wine per week   Drug use: No   Sexual activity: Not on file  Other Topics Concern   Not on file  Social History Narrative   Never smoked, 1 child at home       Social Determinants of Health   Financial Resource Strain: Not on file  Food Insecurity: Not on file  Transportation Needs: Not on file  Physical Activity: Not on file  Stress: Not on file  Social Connections: Not on file  Intimate Partner Violence: Not on file    Physical Exam: There were no vitals filed for this visit. There is no height or weight on file to calculate BMI. GEN: NAD EYE: Sclerae anicteric ENT: MMM CV: Non-tachycardic GI: Soft, NT/ND NEURO:  Alert & Oriented x 3  Lab Results: No results for input(s): "WBC", "HGB", "HCT", "PLT" in the last 72 hours. BMET No results for input(s): "NA", "K", "CL", "CO2", "GLUCOSE", "BUN", "CREATININE", "CALCIUM" in the last 72 hours. LFT No results for input(s): "PROT", "ALBUMIN", "AST", "ALT", "ALKPHOS", "BILITOT", "BILIDIR", "IBILI" in the last 72 hours. PT/INR No results for input(s): "LABPROT", "INR" in the last 72 hours.   Impression / Plan: This is a 67 y.o.male who presents for Surveillance Colonoscopy.  The risks and benefits of endoscopic evaluation/treatment were discussed with the patient and/or family; these include but are not limited to the risk of perforation, infection, bleeding, missed  lesions, lack of diagnosis, severe illness requiring hospitalization, as well as anesthesia and sedation related illnesses.  The patient's history has been reviewed, patient examined, no change in status, and deemed stable for procedure.  The patient and/or family is agreeable to proceed.    Justice Britain, MD [facility]oscopy Office # [id number]

## 2022-04-17 NOTE — Op Note (Signed)
[facility] Patient Name: Kevin Farley Procedure Date: 04/17/2022 2:53 PM MRN: [id number] Endoscopist: Justice Britain , MD, [id number] Age: 67 Referring MD:  Date of Birth: 1955-04-10 Gender: Male Account #: [id number] Procedure:                Colonoscopy Indications:              Surveillance: Personal history of adenomatous                            polyps on last colonoscopy > 5 years ago,                            Incidental rectal bleeding likely hemorrhoidal Medicines:                Monitored Anesthesia Care Procedure:                Pre-Anesthesia Assessment:                           - Prior to the procedure, a History and Physical                            was performed, and patient medications and                            allergies were reviewed. The patient's tolerance of                            previous anesthesia was also reviewed. The risks                            and benefits of the procedure and the sedation                            options and risks were discussed with the patient.                            All questions were answered, and informed consent                            was obtained. Prior Anticoagulants: The patient has                            taken no anticoagulant or antiplatelet agents. ASA                            Grade Assessment: II - A patient with mild systemic                            disease. After reviewing the risks and benefits,                            the patient was deemed in satisfactory condition to  undergo the procedure.                           After obtaining informed consent, the colonoscope                            was passed under direct vision. Throughout the                            procedure, the patient's blood pressure, pulse, and                            oxygen saturations were monitored continuously. The                            Olympus [id number]  [id number]) Colonoscope was                            introduced through the anus and advanced to the 3                            cm into the ileum. The colonoscopy was performed                            without difficulty. The patient tolerated the                            procedure. The quality of the bowel preparation was                            good. The terminal ileum, ileocecal valve,                            appendiceal orifice, and rectum were photographed. Scope In: 3:01:20 PM Scope Out: 3:20:25 PM Scope Withdrawal Time: 0 hours 15 minutes 43 seconds  Total Procedure Duration: 0 hours 19 minutes 5 seconds  Findings:                 The digital rectal exam findings include                            hemorrhoids. Pertinent negatives include no                            palpable rectal lesions.                           The terminal ileum and ileocecal valve appeared                            normal.                           Five sessile polyps were found in the rectum (2),  descending colon (1), transverse colon (1) and                            cecum (1). The polyps were 3 to 14 mm in size.                            These polyps were removed with a cold snare.                            Resection and retrieval were complete.                           Normal mucosa was found in the entire colon                            otherwise.                           Non-bleeding non-thrombosed external and internal                            hemorrhoids were found during retroflexion, during                            perianal exam and during digital exam. The                            hemorrhoids were Grade II (internal hemorrhoids                            that prolapse but reduce spontaneously). Complications:            No immediate complications. Estimated Blood Loss:     Estimated blood loss was minimal. Impression:               -  Hemorrhoids found on digital rectal exam.                           - The examined portion of the ileum was normal.                           - Five, 3-14 mm polyps in the rectum, in the                            descending colon, in the transverse colon and in                            the cecum, removed with a cold snare. Resected and                            retrieved.                           - Normal mucosa in the entire examined colon  otherwise,.                           - Non-bleeding non-thrombosed external and internal                            hemorrhoids. Recommendation:           - The patient will be observed post-procedure,                            until all discharge criteria are met.                           - Discharge patient to home.                           - Patient has a contact number available for                            emergencies. The signs and symptoms of potential                            delayed complications were discussed with the                            patient. Return to normal activities tomorrow.                            Written discharge instructions were provided to the                            patient.                           - High fiber diet.                           - Use FiberCon 1-2 tablets PO daily.                           - Continue present medications.                           - Await pathology results.                           - Repeat colonoscopy in 3 years for surveillance.                           - If issues persist in regards to rectal bleeding                            after initiation of fiber supplementation as well                            as the Anusol suppositories, I would recommend  consideration of internal hemorrhoidal banding with                            Dr. Candis Schatz.                           - The findings and recommendations were  discussed                            with the patient.                           - The findings and recommendations were discussed                            with the patient's family. Justice Britain, MD 04/17/2022 3:29:00 PM

## 2022-04-17 NOTE — Progress Notes (Signed)
PT taken to PACU. Monitors in place. VSS. Report given to RN. 

## 2022-04-18 ENCOUNTER — Telehealth: Payer: Self-pay

## 2022-04-18 NOTE — Telephone Encounter (Signed)
  Follow up Call-     04/17/2022    2:25 PM  Call back number  Post procedure Call Back phone  # [phone]  Permission to leave phone message Yes     Patient questions:  Do you have a fever, pain , or abdominal swelling? No. Pain Score  0 *  Have you tolerated food without any problems? Yes.    Have you been able to return to your normal activities? Yes.    Do you have any questions about your discharge instructions: Diet   No. Medications  No. Follow up visit  No.  Do you have questions or concerns about your Care? No.  Actions: * If pain score is 4 or above: No action needed, pain <4.

## 2022-04-24 ENCOUNTER — Encounter: Payer: Self-pay | Admitting: Gastroenterology

## 2022-05-07 ENCOUNTER — Telehealth: Payer: Self-pay

## 2022-05-07 NOTE — Telephone Encounter (Signed)
Informed patient that he is scheduled for a video visit with Dr.Cunningham 07/10/22 @ 10:30am

## 2022-05-28 ENCOUNTER — Ambulatory Visit (INDEPENDENT_AMBULATORY_CARE_PROVIDER_SITE_OTHER): Payer: Medicare Other | Admitting: Internal Medicine

## 2022-05-28 ENCOUNTER — Encounter: Payer: Self-pay | Admitting: Internal Medicine

## 2022-05-28 VITALS — BP 136/84 | HR 78 | Temp 98.6°F | Ht 66.0 in | Wt 139.0 lb

## 2022-05-28 DIAGNOSIS — E559 Vitamin D deficiency, unspecified: Secondary | ICD-10-CM | POA: Diagnosis not present

## 2022-05-28 DIAGNOSIS — R7303 Prediabetes: Secondary | ICD-10-CM

## 2022-05-28 DIAGNOSIS — I10 Essential (primary) hypertension: Secondary | ICD-10-CM

## 2022-05-28 DIAGNOSIS — R051 Acute cough: Secondary | ICD-10-CM | POA: Diagnosis not present

## 2022-05-28 LAB — POCT INFLUENZA A/B
Influenza A, POC: NEGATIVE
Influenza B, POC: NEGATIVE

## 2022-05-28 LAB — POC COVID19 BINAXNOW: SARS Coronavirus 2 Ag: NEGATIVE

## 2022-05-28 LAB — POCT RESPIRATORY SYNCYTIAL VIRUS: RSV Rapid Ag: NEGATIVE

## 2022-05-28 MED ORDER — HYDROCODONE BIT-HOMATROP MBR 5-1.5 MG/5ML PO SOLN: 5.0000 mL | Freq: Four times a day (QID) | ORAL | 0 refills | Status: AC | PRN

## 2022-05-28 MED ORDER — AMOXICILLIN-POT CLAVULANATE 875-125 MG PO TABS: 1.0000 | ORAL_TABLET | Freq: Two times a day (BID) | ORAL | 0 refills | Status: DC

## 2022-05-28 NOTE — Patient Instructions (Signed)
Your COVID, Flu and RSV testing is negative  Please take all new medication as prescribed  - the antibiotic, and cough medicine as needed  Please continue all other medications as before, and refills have been done if requested.  Please have the pharmacy call with any other refills you may need..  Please keep your appointments with your specialists as you may have planned   

## 2022-05-28 NOTE — Progress Notes (Signed)
Patient ID: Kevin Farley, male   DOB: Jul 11, 1955, 67 y.o.   MRN: [id number]        Chief Complaint: follow up HTN, hyperglycemia, productive cough        HPI:  Kevin Farley is a 67 y.o. male Here with acute onset mild to mod 2-3 days ST, HA, general weakness and malaise, with prod cough greenish sputum, but Pt denies chest pain, increased sob or doe, wheezing, orthopnea, PND, increased LE swelling, palpitations, dizziness or syncope.   Pt denies polydipsia, polyuria, or new focal neuro s/s.    Pt denies fever, wt loss, night sweats, loss of appetite, or other constitutional symptoms     Wt Readings from Last 3 Encounters:  05/28/22 139 lb (63 kg)  04/17/22 142 lb (64.4 kg)  02/12/22 142 lb (64.4 kg)   BP Readings from Last 3 Encounters:  05/28/22 136/84  04/17/22 128/77  02/12/22 130/68         Past Medical History:  Diagnosis Date   Allergic rhinitis    Colon polyps    adenomatous 2011 & 2014   Family history of ischemic heart disease    GERD (gastroesophageal reflux disease)    HTN (hypertension) 04/16/2019   Hyperlipidemia    Other abnormal glucose    A1c 6% in 06/2009   Supraspinatus tendon tear    Past Surgical History:  Procedure Laterality Date   BACK SURGERY  02/12/2004   Dr Channing Mutters, [place]   cardic catherization  02/12/2003   negative; Dr Marni Griffon   colon polyps  2011 & 2014   Dr.Jacobs    COLONOSCOPY     CORNEAL TRANSPLANT Left 02/12/1991   Dr Dagoberto Ligas   nasal bone surgery  02/11/2005   UPPER GASTROINTESTINAL ENDOSCOPY  02/11/2009   mild gastritis; Dr Christella Hartigan    reports that he has never smoked. He has never used smokeless tobacco. He reports current alcohol use of about 7.0 standard drinks of alcohol per week. He reports that he does not use drugs. family history includes Diabetes in his brother, brother, father, mother, and sister; Heart attack in his brother and father; Hypertension in his brother, father, mother, and sister; Kidney failure in his brother and brother;  Stroke in his mother; Transient ischemic attack in his brother. Allergies  Allergen Reactions   Aspirin     REACTION: hives States tolerates IBU & acetaminophen OK   Other     Walnuts & pecans cause hives   Codeine     Itching with codeine containing cough syrup 9/15   Pecan Nut (Diagnostic) Hives   Zetia [Ezetimibe]     See 09/22/14 musculoskeletal pains   Ezetimibe-Simvastatin Hives    REACTION: myalgia   Current Outpatient Medications on File Prior to Visit  Medication Sig Dispense Refill   Blood Glucose Monitoring Suppl (ONE TOUCH ULTRA 2) w/Device KIT Use as directed once daily E11.9 1 kit 0   Cholecalciferol (THERA-D 2000) 50 MCG (2000 UT) TABS 1 tab by mouth once daily 30 tablet 99   Coenzyme Q10 (CO Q-10) 100 MG CAPS Take 1 capsule by mouth daily.     dexlansoprazole (DEXILANT) 60 MG capsule Take 1 capsule (60 mg total) by mouth daily. 90 capsule 3   Epinastine HCl 0.05 % ophthalmic solution SMARTSIG:1 Drop(s) In Eye(s) Twice Daily PRN     ezetimibe (ZETIA) 10 MG tablet Take 1 tablet (10 mg total) by mouth daily. 90 tablet 3   Flaxseed, Linseed, (FLAX SEED OIL PO)  Take by mouth.     fluticasone (FLONASE) 50 MCG/ACT nasal spray SPRAY 2 SPRAYS INTO EACH NOSTRIL DAILY 48 mL 3   glucose blood (ONETOUCH ULTRA) test strip Use as instructed once daily E11.9 100 each 12   hydrocortisone (ANUSOL-HC) 2.5 % rectal cream PLACE 1 APPLICATION RECTALLY 2 (TWO) TIMES DAILY 30 g 2   hydrocortisone (ANUSOL-HC) 25 MG suppository Place 1 suppository (25 mg total) rectally every 12 (twelve) hours. 12 suppository 1   Lancets MISC Use as directed once daily E11.9 100 each 3   losartan (COZAAR) 50 MG tablet Take 1 tablet (50 mg total) by mouth daily. 90 tablet 3   Multiple Vitamin (MULTIVITAMIN) tablet Take 1 tablet by mouth daily.     Omega-3 Fatty Acids (FISH OIL) 500 MG CAPS Take 1 capsule by mouth daily.     rosuvastatin (CRESTOR) 40 MG tablet TAKE 1 TABLET BY MOUTH EVERY OTHER DAY 45 tablet 3    sildenafil (VIAGRA) 100 MG tablet Take 0.5-1 tablets (50-100 mg total) by mouth daily as needed for erectile dysfunction. 5 tablet 11   [DISCONTINUED] Lansoprazole (PREVACID PO) Take by mouth daily.       [DISCONTINUED] omeprazole (PRILOSEC) 40 MG capsule Take 1 capsule (40 mg total) by mouth daily. 180 capsule 1   No current facility-administered medications on file prior to visit.        ROS:  All others reviewed and negative.  Objective        PE:  BP 136/84 (BP Location: Right Arm, Patient Position: Sitting, Cuff Size: Normal)   Pulse 78   Temp 98.6 F (37 C) (Oral)   Ht 5\' 6"  (1.676 m)   Wt 139 lb (63 kg)   SpO2 98%   BMI 22.44 kg/m                 Constitutional: Pt appears in NAD, mild ill               HENT: Head: NCAT.                Right Ear: External ear normal.                 Left Ear: External ear normal. Bilat tm's with mild erythema.  Max sinus areas non tender.  Pharynx with mild erythema, no exudate                 Eyes: . Pupils are equal, round, and reactive to light. Conjunctivae and EOM are normal               Nose: without d/c or deformity               Neck: Neck supple. Gross normal ROM               Cardiovascular: Normal rate and regular rhythm.                 Pulmonary/Chest: Effort normal and breath sounds without rales or wheezing.                Abd:  Soft, NT, ND, + BS, no organomegaly               Neurological: Pt is alert. At baseline orientation, motor grossly intact               Skin: Skin is warm. No rashes, no other new lesions, LE edema - none  Psychiatric: Pt behavior is normal without agitation   Micro: none  Cardiac tracings I have personally interpreted today:  none  Pertinent Radiological findings (summarize): none   Lab Results  Component Value Date   WBC 4.3 12/28/2021   HGB 14.0 12/28/2021   HCT 41.9 12/28/2021   PLT 189.0 12/28/2021   GLUCOSE 109 (H) 12/28/2021   CHOL 242 (H) 12/28/2021   TRIG 151.0 (H)  12/28/2021   HDL 68.10 12/28/2021   LDLDIRECT 150.0 11/18/2019   LDLCALC 144 (H) 12/28/2021   ALT 26 12/28/2021   AST 24 12/28/2021   NA 139 12/28/2021   K 4.3 12/28/2021   CL 103 12/28/2021   CREATININE 1.12 12/28/2021   BUN 19 12/28/2021   CO2 31 12/28/2021   TSH 2.92 06/27/2021   PSA 1.77 06/27/2021   HGBA1C 6.3 12/28/2021   POCT - Covid - neg, RSV - neg, Flu - neg  Assessment/Plan:  NIO GUTTILLA is a 67 y.o. Other or two or more races [6] male with  has a past medical history of Allergic rhinitis, Colon polyps, Family history of ischemic heart disease, GERD (gastroesophageal reflux disease), HTN (hypertension) (04/16/2019), Hyperlipidemia, Other abnormal glucose, and Supraspinatus tendon tear.  HTN (hypertension) BP Readings from Last 3 Encounters:  05/28/22 136/84  04/17/22 128/77  02/12/22 130/68   Stable, pt to continue medical treatment losartan 50 mg qd   Pre-diabetes Lab Results  Component Value Date   HGBA1C 6.3 12/28/2021   Stable, pt to continue current medical treatment  - diet, wt control   Vitamin D deficiency Last vitamin D Lab Results  Component Value Date   VD25OH 57.39 12/28/2021   Stable, cont oral replacement   Cough Mild to mod, c/w infectious, viral testing neg, can't r/o pna, declines cxr, now for antibx course, cough med prn,  to f/u any worsening symptoms or concerns  Followup: Return if symptoms worsen or fail to improve.  Oliver Barre, MD 05/30/2022 8:17 PM [facility]

## 2022-05-30 ENCOUNTER — Encounter: Payer: Self-pay | Admitting: Internal Medicine

## 2022-05-30 NOTE — Assessment & Plan Note (Signed)
Lab Results  Component Value Date   HGBA1C 6.3 12/28/2021   Stable, pt to continue current medical treatment  - diet, wt control

## 2022-05-30 NOTE — Assessment & Plan Note (Signed)
Last vitamin D Lab Results  Component Value Date   VD25OH 57.39 12/28/2021   Stable, cont oral replacement  

## 2022-05-30 NOTE — Assessment & Plan Note (Signed)
BP Readings from Last 3 Encounters:  05/28/22 136/84  04/17/22 128/77  02/12/22 130/68   Stable, pt to continue medical treatment losartan 50 mg qd

## 2022-05-30 NOTE — Assessment & Plan Note (Signed)
Mild to mod, c/w infectious, viral testing neg, can't r/o pna, declines cxr, now for antibx course, cough med prn,  to f/u any worsening symptoms or concerns

## 2022-06-16 DIAGNOSIS — H5712 Ocular pain, left eye: Secondary | ICD-10-CM | POA: Diagnosis not present

## 2022-06-16 DIAGNOSIS — Z947 Corneal transplant status: Secondary | ICD-10-CM | POA: Diagnosis not present

## 2022-06-18 DIAGNOSIS — Z947 Corneal transplant status: Secondary | ICD-10-CM | POA: Diagnosis not present

## 2022-06-18 DIAGNOSIS — H04122 Dry eye syndrome of left lacrimal gland: Secondary | ICD-10-CM | POA: Diagnosis not present

## 2022-06-28 ENCOUNTER — Ambulatory Visit (INDEPENDENT_AMBULATORY_CARE_PROVIDER_SITE_OTHER): Payer: Medicare Other | Admitting: Internal Medicine

## 2022-06-28 ENCOUNTER — Encounter: Payer: Self-pay | Admitting: Internal Medicine

## 2022-06-28 ENCOUNTER — Other Ambulatory Visit: Payer: Self-pay | Admitting: Internal Medicine

## 2022-06-28 VITALS — BP 138/80 | HR 75 | Temp 98.5°F | Ht 66.0 in | Wt 139.0 lb

## 2022-06-28 DIAGNOSIS — E782 Mixed hyperlipidemia: Secondary | ICD-10-CM | POA: Diagnosis not present

## 2022-06-28 DIAGNOSIS — E559 Vitamin D deficiency, unspecified: Secondary | ICD-10-CM | POA: Diagnosis not present

## 2022-06-28 DIAGNOSIS — R7303 Prediabetes: Secondary | ICD-10-CM | POA: Diagnosis not present

## 2022-06-28 DIAGNOSIS — R739 Hyperglycemia, unspecified: Secondary | ICD-10-CM

## 2022-06-28 DIAGNOSIS — E538 Deficiency of other specified B group vitamins: Secondary | ICD-10-CM | POA: Diagnosis not present

## 2022-06-28 DIAGNOSIS — Z0000 Encounter for general adult medical examination without abnormal findings: Secondary | ICD-10-CM

## 2022-06-28 DIAGNOSIS — Z23 Encounter for immunization: Secondary | ICD-10-CM | POA: Diagnosis not present

## 2022-06-28 DIAGNOSIS — Z0001 Encounter for general adult medical examination with abnormal findings: Secondary | ICD-10-CM | POA: Diagnosis not present

## 2022-06-28 DIAGNOSIS — I10 Essential (primary) hypertension: Secondary | ICD-10-CM

## 2022-06-28 LAB — TSH: TSH: 2.47 u[IU]/mL (ref 0.35–5.50)

## 2022-06-28 LAB — LIPID PANEL
Cholesterol: 204 mg/dL — ABNORMAL HIGH (ref 0–200)
HDL: 64.7 mg/dL (ref >39.00)
LDL Cholesterol: 118 mg/dL — ABNORMAL HIGH (ref 0–99)
NonHDL: 138.86
Total CHOL/HDL Ratio: 3
Triglycerides: 106 mg/dL (ref 0.0–149.0)
VLDL: 21.2 mg/dL (ref 0.0–40.0)

## 2022-06-28 LAB — CBC WITH DIFFERENTIAL/PLATELET
Basophils Absolute: 0 10*3/uL (ref 0.0–0.1)
Basophils Relative: 0.6 % (ref 0.0–3.0)
Eosinophils Absolute: 0.1 10*3/uL (ref 0.0–0.7)
Eosinophils Relative: 1.6 % (ref 0.0–5.0)
HCT: 42.7 % (ref 39.0–52.0)
Hemoglobin: 14.1 g/dL (ref 13.0–17.0)
Lymphocytes Relative: 26.7 % (ref 12.0–46.0)
Lymphs Abs: 1.2 10*3/uL (ref 0.7–4.0)
MCHC: 33.1 g/dL (ref 30.0–36.0)
MCV: 84.5 fl (ref 78.0–100.0)
Monocytes Absolute: 0.4 10*3/uL (ref 0.1–1.0)
Monocytes Relative: 9.2 % (ref 3.0–12.0)
Neutro Abs: 2.7 10*3/uL (ref 1.4–7.7)
Neutrophils Relative %: 61.9 % (ref 43.0–77.0)
Platelets: 188 10*3/uL (ref 150.0–400.0)
RBC: 5.06 Mil/uL (ref 4.22–5.81)
RDW: 13.9 % (ref 11.5–15.5)
WBC: 4.4 10*3/uL (ref 4.0–10.5)

## 2022-06-28 LAB — URINALYSIS, ROUTINE W REFLEX MICROSCOPIC
Bilirubin Urine: NEGATIVE
Hgb urine dipstick: NEGATIVE
Ketones, ur: NEGATIVE
Leukocytes,Ua: NEGATIVE
Nitrite: NEGATIVE
Specific Gravity, Urine: 1.015 (ref 1.000–1.030)
Total Protein, Urine: NEGATIVE
Urine Glucose: NEGATIVE
Urobilinogen, UA: 0.2 (ref 0.0–1.0)
pH: 7 (ref 5.0–8.0)

## 2022-06-28 LAB — HEPATIC FUNCTION PANEL
ALT: 29 U/L (ref 0–53)
AST: 21 U/L (ref 0–37)
Albumin: 4.4 g/dL (ref 3.5–5.2)
Alkaline Phosphatase: 48 U/L (ref 39–117)
Bilirubin, Direct: 0.1 mg/dL (ref 0.0–0.3)
Total Bilirubin: 0.5 mg/dL (ref 0.2–1.2)
Total Protein: 7.4 g/dL (ref 6.0–8.3)

## 2022-06-28 LAB — BASIC METABOLIC PANEL
BUN: 14 mg/dL (ref 6–23)
CO2: 31 mEq/L (ref 19–32)
Calcium: 9.4 mg/dL (ref 8.4–10.5)
Chloride: 105 mEq/L (ref 96–112)
Creatinine, Ser: 1 mg/dL (ref 0.40–1.50)
GFR: 78.97 mL/min (ref >60.00)
Glucose, Bld: 111 mg/dL — ABNORMAL HIGH (ref 70–99)
Potassium: 4.7 mEq/L (ref 3.5–5.1)
Sodium: 142 mEq/L (ref 135–145)

## 2022-06-28 LAB — PSA: PSA: 1.18 ng/mL (ref 0.10–4.00)

## 2022-06-28 LAB — VITAMIN B12: Vitamin B-12: 595 pg/mL (ref 211–911)

## 2022-06-28 LAB — VITAMIN D 25 HYDROXY (VIT D DEFICIENCY, FRACTURES): VITD: 31.07 ng/mL (ref 30.00–100.00)

## 2022-06-28 LAB — HEMOGLOBIN A1C: Hgb A1c MFr Bld: 6.2 % (ref 4.6–6.5)

## 2022-06-28 MED ORDER — REPATHA SURECLICK 140 MG/ML ~~LOC~~ SOAJ: 140.0000 mg | SUBCUTANEOUS | 3 refills | Status: DC

## 2022-06-28 NOTE — Progress Notes (Signed)
Patient ID: Kevin Farley, male   DOB: 1955-06-05, 67 y.o.   MRN: [id number]         Chief Complaint:: wellness exam and hld, low vit d, preDM, htn       HPI:  Kevin Farley is a 67 y.o. male here for wellness exam; for shingrix and tdap at the pharmacy, for prevnar 20 today, o/w up to date                        Also Pt denies chest pain, increased sob or doe, wheezing, orthopnea, PND, increased LE swelling, palpitations, dizziness or syncope.   Pt denies polydipsia, polyuria, or new focal neuro s/s.    Pt denies fever, wt loss, night sweats, loss of appetite, or other constitutional symptoms  Taking crestor and zetia only each alternating days.    Wt Readings from Last 3 Encounters:  06/28/22 139 lb (63 kg)  05/28/22 139 lb (63 kg)  04/17/22 142 lb (64.4 kg)   BP Readings from Last 3 Encounters:  06/28/22 138/80  05/28/22 136/84  04/17/22 128/77   Immunization History  Administered Date(s) Administered   Fluad Quad(high Dose 65+) 12/28/2021   Influenza Split 11/08/2011   Influenza,inj,Quad PF,6+ Mos 10/21/2012, 10/27/2013, 01/20/2015, 01/09/2016, 11/21/2016, 12/16/2017, 10/30/2018, 11/09/2020   Influenza-Unspecified 11/21/2016, 02/25/2020   PFIZER(Purple Top)SARS-COV-2 Vaccination 04/23/2019, 05/17/2019, 02/08/2020   PNEUMOCOCCAL CONJUGATE-20 06/28/2022   Tdap 10/19/2010   Health Maintenance Due  Topic Date Due   Medicare Annual Wellness (AWV)  Never done   Zoster Vaccines- Shingrix (1 of 2) Never done   DTaP/Tdap/Td (2 - Td or Tdap) 10/18/2020      Past Medical History:  Diagnosis Date   Allergic rhinitis    Colon polyps    adenomatous 2011 & 2014   Family history of ischemic heart disease    GERD (gastroesophageal reflux disease)    HTN (hypertension) 04/16/2019   Hyperlipidemia    Other abnormal glucose    A1c 6% in 06/2009   Supraspinatus tendon tear    Past Surgical History:  Procedure Laterality Date   BACK SURGERY  02/12/2004   Dr Channing Mutters, [place]   cardic catherization   02/12/2003   negative; Dr Marni Griffon   colon polyps  2011 & 2014   Dr.Jacobs    COLONOSCOPY     CORNEAL TRANSPLANT Left 02/12/1991   Dr Dagoberto Ligas   nasal bone surgery  02/11/2005   UPPER GASTROINTESTINAL ENDOSCOPY  02/11/2009   mild gastritis; Dr Christella Hartigan    reports that he has never smoked. He has never used smokeless tobacco. He reports current alcohol use of about 7.0 standard drinks of alcohol per week. He reports that he does not use drugs. family history includes Diabetes in his brother, brother, father, mother, and sister; Heart attack in his brother and father; Hypertension in his brother, father, mother, and sister; Kidney failure in his brother and brother; Stroke in his mother; Transient ischemic attack in his brother. Allergies  Allergen Reactions   Aspirin     REACTION: hives States tolerates IBU & acetaminophen OK   Other     Walnuts & pecans cause hives   Codeine     Itching with codeine containing cough syrup 9/15   Pecan Nut (Diagnostic) Hives   Zetia [Ezetimibe]     See 09/22/14 musculoskeletal pains   Ezetimibe-Simvastatin Hives    REACTION: myalgia   Current Outpatient Medications on File Prior to Visit  Medication Sig  Dispense Refill   Blood Glucose Monitoring Suppl (ONE TOUCH ULTRA 2) w/Device KIT Use as directed once daily E11.9 1 kit 0   Cholecalciferol (THERA-D 2000) 50 MCG (2000 UT) TABS 1 tab by mouth once daily 30 tablet 99   Coenzyme Q10 (CO Q-10) 100 MG CAPS Take 1 capsule by mouth daily.     dexlansoprazole (DEXILANT) 60 MG capsule Take 1 capsule (60 mg total) by mouth daily. 90 capsule 3   Epinastine HCl 0.05 % ophthalmic solution SMARTSIG:1 Drop(s) In Eye(s) Twice Daily PRN     Flaxseed, Linseed, (FLAX SEED OIL PO) Take by mouth.     fluticasone (FLONASE) 50 MCG/ACT nasal spray SPRAY 2 SPRAYS INTO EACH NOSTRIL DAILY 48 mL 3   glucose blood (ONETOUCH ULTRA) test strip Use as instructed once daily E11.9 100 each 12   hydrocortisone (ANUSOL-HC) 2.5 %  rectal cream PLACE 1 APPLICATION RECTALLY 2 (TWO) TIMES DAILY 30 g 2   hydrocortisone (ANUSOL-HC) 25 MG suppository Place 1 suppository (25 mg total) rectally every 12 (twelve) hours. 12 suppository 1   Lancets MISC Use as directed once daily E11.9 100 each 3   losartan (COZAAR) 50 MG tablet Take 1 tablet (50 mg total) by mouth daily. 90 tablet 3   Multiple Vitamin (MULTIVITAMIN) tablet Take 1 tablet by mouth daily.     Omega-3 Fatty Acids (FISH OIL) 500 MG CAPS Take 1 capsule by mouth daily.     rosuvastatin (CRESTOR) 40 MG tablet TAKE 1 TABLET BY MOUTH EVERY OTHER DAY 45 tablet 3   sildenafil (VIAGRA) 100 MG tablet Take 0.5-1 tablets (50-100 mg total) by mouth daily as needed for erectile dysfunction. 5 tablet 11   [DISCONTINUED] Lansoprazole (PREVACID PO) Take by mouth daily.       [DISCONTINUED] omeprazole (PRILOSEC) 40 MG capsule Take 1 capsule (40 mg total) by mouth daily. 180 capsule 1   No current facility-administered medications on file prior to visit.        ROS:  All others reviewed and negative.  Objective        PE:  BP 138/80 (BP Location: Right Arm, Patient Position: Sitting, Cuff Size: Normal)   Pulse 75   Temp 98.5 F (36.9 C) (Oral)   Ht 5\' 6"  (1.676 m)   Wt 139 lb (63 kg)   SpO2 99%   BMI 22.44 kg/m                 Constitutional: Pt appears in NAD               HENT: Head: NCAT.                Right Ear: External ear normal.                 Left Ear: External ear normal.                Eyes: . Pupils are equal, round, and reactive to light. Conjunctivae and EOM are normal               Nose: without d/c or deformity               Neck: Neck supple. Gross normal ROM               Cardiovascular: Normal rate and regular rhythm.                 Pulmonary/Chest: Effort normal and breath sounds without rales or  wheezing.                Abd:  Soft, NT, ND, + BS, no organomegaly               Neurological: Pt is alert. At baseline orientation, motor grossly intact                Skin: Skin is warm. No rashes, no other new lesions, LE edema - none               Psychiatric: Pt behavior is normal without agitation   Micro: none  Cardiac tracings I have personally interpreted today:  none  Pertinent Radiological findings (summarize): none   Lab Results  Component Value Date   WBC 4.4 06/28/2022   HGB 14.1 06/28/2022   HCT 42.7 06/28/2022   PLT 188.0 06/28/2022   GLUCOSE 111 (H) 06/28/2022   CHOL 204 (H) 06/28/2022   TRIG 106.0 06/28/2022   HDL 64.70 06/28/2022   LDLDIRECT 150.0 11/18/2019   LDLCALC 118 (H) 06/28/2022   ALT 29 06/28/2022   AST 21 06/28/2022   NA 142 06/28/2022   K 4.7 06/28/2022   CL 105 06/28/2022   CREATININE 1.00 06/28/2022   BUN 14 06/28/2022   CO2 31 06/28/2022   TSH 2.47 06/28/2022   PSA 1.18 06/28/2022   HGBA1C 6.2 06/28/2022   Assessment/Plan:  Kevin Farley is a 67 y.o. Other or two or more races [6] male with  has a past medical history of Allergic rhinitis, Colon polyps, Family history of ischemic heart disease, GERD (gastroesophageal reflux disease), HTN (hypertension) (04/16/2019), Hyperlipidemia, Other abnormal glucose, and Supraspinatus tendon tear.  Encounter for well adult exam with abnormal findings Age and sex appropriate education and counseling updated with regular exercise and diet Referrals for preventative services - none needed Immunizations addressed - for prevnar 20 today, for shingrix and tdap at the pharmacy Smoking counseling  - none needed Evidence for depression or other mood disorder - none significant Most recent labs reviewed. I have personally reviewed and have noted: 1) the patient's medical and social history 2) The patient's current medications and supplements 3) The patient's height, weight, and BMI have been recorded in the chart   Vitamin D deficiency Last vitamin D Lab Results  Component Value Date   VD25OH 31.07 06/28/2022   Low, to start oral  replacement   Hyperlipidemia Lab Results  Component Value Date   LDLCALC 118 (H) 06/28/2022   Uncontrolled, pt to continue current crestor 40 but change zetia to repatha 140 q 2 wks   Pre-diabetes Lab Results  Component Value Date   HGBA1C 6.2 06/28/2022   Stable, pt to continue current medical treatment  - diet, wt control   HTN (hypertension) BP Readings from Last 3 Encounters:  06/28/22 138/80  05/28/22 136/84  04/17/22 128/77   Stable, pt to continue medical treatment losartan 50 mg qd  Followup: Return in about 6 months (around 12/29/2022).  Oliver Barre, MD 06/30/2022 7:40 PM [facility] - [facility]

## 2022-06-28 NOTE — Patient Instructions (Addendum)
Please have your Shingrix (shingles) shots done at your local pharmacy, and the Tdap tetanus shot  You had the Prevnar 20 pneumonia shot today  Please continue all other medications as before, and refills have been done if requested.  Please have the pharmacy call with any other refills you may need.  Please continue your efforts at being more active, low cholesterol diet, and weight control.  You are otherwise up to date with prevention measures today.  Please keep your appointments with your specialists as you may have planned  Please go to the LAB at the blood drawing area for the tests to be done  You will be contacted by phone if any changes need to be made immediately.  Otherwise, you will receive a letter about your results with an explanation, but please check with MyChart first.  Please consider changing the zetia to the Repatha shot every 2 wks in addition to the Crestor 40 mg every other day to get the LDL cholesterol less than 70  Please remember to sign up for MyChart if you have not done so, as this will be important to you in the future with finding out test results, communicating by private email, and scheduling acute appointments online when needed.  Please make an Appointment to return in 6 months, or sooner if needed

## 2022-06-30 ENCOUNTER — Encounter: Payer: Self-pay | Admitting: Internal Medicine

## 2022-06-30 NOTE — Assessment & Plan Note (Signed)
BP Readings from Last 3 Encounters:  06/28/22 138/80  05/28/22 136/84  04/17/22 128/77   Stable, pt to continue medical treatment losartan 50 mg qd

## 2022-06-30 NOTE — Assessment & Plan Note (Signed)
Lab Results  Component Value Date   LDLCALC 118 (H) 06/28/2022   Uncontrolled, pt to continue current crestor 40 but change zetia to repatha 140 q 2 wks

## 2022-06-30 NOTE — Assessment & Plan Note (Signed)
Lab Results  Component Value Date   HGBA1C 6.2 06/28/2022   Stable, pt to continue current medical treatment  - diet, wt control

## 2022-06-30 NOTE — Assessment & Plan Note (Signed)
Last vitamin D Lab Results  Component Value Date   VD25OH 31.07 06/28/2022   Low, to start oral replacement

## 2022-06-30 NOTE — Assessment & Plan Note (Signed)
Age and sex appropriate education and counseling updated with regular exercise and diet Referrals for preventative services - none needed Immunizations addressed - for prevnar 20 today, for shingrix and tdap at the pharmacy Smoking counseling  - none needed Evidence for depression or other mood disorder - none significant Most recent labs reviewed. I have personally reviewed and have noted: 1) the patient's medical and social history 2) The patient's current medications and supplements 3) The patient's height, weight, and BMI have been recorded in the chart

## 2022-07-09 ENCOUNTER — Telehealth: Payer: Self-pay

## 2022-07-09 NOTE — Telephone Encounter (Signed)
Patient Advocate Encounter   Received notification from [facility] that prior authorization for Repatha is required.   PA submitted on 07/09/2022 Key [id number] Insurance [employer] Status is pending

## 2022-07-10 ENCOUNTER — Telehealth (INDEPENDENT_AMBULATORY_CARE_PROVIDER_SITE_OTHER): Payer: Medicare Other | Admitting: Gastroenterology

## 2022-07-10 ENCOUNTER — Encounter: Payer: Self-pay | Admitting: Gastroenterology

## 2022-07-10 DIAGNOSIS — L290 Pruritus ani: Secondary | ICD-10-CM | POA: Diagnosis not present

## 2022-07-10 DIAGNOSIS — K648 Other hemorrhoids: Secondary | ICD-10-CM | POA: Diagnosis not present

## 2022-07-10 NOTE — Telephone Encounter (Signed)
Pharmacy Patient Advocate Encounter  Prior Authorization for Repatha has been approved by [employer] (ins).    PA # [id number] Effective dates: 07/10/2022 through 01/09/2023

## 2022-07-10 NOTE — Telephone Encounter (Signed)
Sent mychart msg concerning PA../l,mb

## 2022-07-10 NOTE — Patient Instructions (Addendum)
_______________________________________________________  If your blood pressure at your visit was 140/90 or greater, please contact your primary care physician to follow up on this.  _______________________________________________________  If you are age 66 or older, your body mass index should be between 23-30. Your There is no height or weight on file to calculate BMI. If this is out of the aforementioned range listed, please consider follow up with your Primary Care Provider.  If you are age 85 or younger, your body mass index should be between 19-25. Your There is no height or weight on file to calculate BMI. If this is out of the aformentioned range listed, please consider follow up with your Primary Care Provider.   Stop preparation H  Use Desitin  Please call to make an appointment if symptoms are not improving.    The [place] [facility] providers would like to encourage you to use [facility] to communicate with providers for non-urgent requests or questions.  Due to long hold times on the telephone, sending your provider a message by [facility] may be a faster and more efficient way to get a response.  Please allow 48 business hours for a response.  Please remember that this is for non-urgent requests.   It was a pleasure to see you today!  Thank you for trusting me with your gastrointestinal care!    Scott E.Tomasa Rand

## 2022-07-10 NOTE — Progress Notes (Signed)
HPI : Virtual Visit via Video Note  I connected with Linton Ham on 07/10/2022 at 10:30 AM EDT by a video enabled telemedicine application and verified that I am speaking with the correct person using two identifiers.  Location: Patient: Kevin Farley Provider: Dr. Tiajuana Amass   I discussed the limitations of evaluation and management by telemedicine and the availability of in person appointments. The patient expressed understanding and agreed to proceed.  History of Present Illness: [name] for a virtual visit that was scheduled to discuss the details of hemorrhoid banding.  I had initially seen the patient in clinic January 2 for symptomatic grade 3 hemorrhoids.  At that time, he complained of symptoms of prolapsing hemorrhoids that occasionally required manual reduction, occasional bleeding, itching and pressure like discomfort.  He was recommended to take daily fiber supplement and to use Anusol suppositories and we also discussed the role of hemorrhoid banding at that time.  He underwent a surveillance colonoscopy by Dr. Meridee Score on March 6 which showed internal and external hemorrhoids, classified as grade 2 at that time.  He also had 5 polyps that were removed and he was recommended to repeat colonoscopy in 3 years.  Today, he reports primary symptoms of perianal itching, but denies any symptoms of prolapsing, swelling/pressure-like pain or bleeding.  He denies any symptoms of constipation or hard stools.  He has regular bowel movements with the passage of soft stools.  No passage of blood or mucus.  He denies symptoms of seepage, incontinence or difficulty maintaining cleanliness in the area.   He states that he has been using Preparation H cream daily for close to 2 months now.  He denies excessive scratching of the area. He is taking Metamucil capsules, 2/day     Observations/Objective: No visual inspection of the affected area was performed during this virtual  visit  Assessment and Plan:  67 year old male with longstanding hemorrhoidal symptoms prolapse, bleeding, perianal/swelling and itching, currently with only itching/dryness as his only symptom.  He has been using topical steroids in the perianal area about 2 months.  I advised him that prolonged topical steroid use in this area can cause skin atrophy which can contribute to hypersensitivity/pruritus.  I advised him to stop using the Preparation H cream.  I suggested he use an inert cream such as Desitin or Vaseline to cover the area to protect it and help it heal.  If this is not getting better, I would need to reexamine the area to look for evidence of lichenification. He is not currently interested in pursuing hemorrhoid banding, and his current symptoms of anal pruritus, would unlikely to improve with banding.  Pruritus ani/symptomatic hemorrhoids - Stop topical hydrocortisone - Use Desitin, Vaseline or other intermittent barrier cream - Continue Metamucil tablets daily - Follow-up in clinic for physical examination if pruritus not improving after 2-4 weeks   Follow Up Instructions:    I discussed the assessment and treatment plan with the patient. The patient was provided an opportunity to ask questions and all were answered. The patient agreed with the plan and demonstrated an understanding of the instructions.   The patient was advised to call back or seek an in-person evaluation if the symptoms worsen or if the condition fails to improve as anticipated.  I provided 13 minutes of non-face-to-face time during this encounter.   Jenel Lucks, MD   Past Medical History:  Diagnosis Date   Allergic rhinitis    Colon polyps  adenomatous 2011 & 2014   Family history of ischemic heart disease    GERD (gastroesophageal reflux disease)    HTN (hypertension) 04/16/2019   Hyperlipidemia    Other abnormal glucose    A1c 6% in 06/2009   Supraspinatus tendon tear      Past  Surgical History:  Procedure Laterality Date   BACK SURGERY  02/12/2004   Dr Channing Mutters, [place]   cardic catherization  02/12/2003   negative; Dr Marni Griffon   colon polyps  2011 & 2014   Dr.Jacobs    COLONOSCOPY     CORNEAL TRANSPLANT Left 02/12/1991   Dr Dagoberto Ligas   nasal bone surgery  02/11/2005   UPPER GASTROINTESTINAL ENDOSCOPY  02/11/2009   mild gastritis; Dr Christella Hartigan   Family History  Problem Relation Age of Onset   Hypertension Mother    Diabetes Mother    Stroke Mother        in 48s   Diabetes Father    Hypertension Father    Heart attack Father        had open heart surgery   Diabetes Sister         X 2   Hypertension Sister    Diabetes Brother        X2   Heart attack Brother         MI in 23s   Transient ischemic attack Brother        in 62s   Kidney failure Brother    Hypertension Brother    Kidney failure Brother        renal transplant   Diabetes Brother    COPD Neg Hx    Asthma Neg Hx    Cancer Neg Hx    Colon polyps Neg Hx    Colon cancer Neg Hx    Esophageal cancer Neg Hx    Rectal cancer Neg Hx    Stomach cancer Neg Hx    Social History   Tobacco Use   Smoking status: Never   Smokeless tobacco: Never  Vaping Use   Vaping Use: Never used  Substance Use Topics   Alcohol use: Yes    Alcohol/week: 7.0 standard drinks of alcohol    Types: 7 Glasses of wine per week   Drug use: No   Current Outpatient Medications  Medication Sig Dispense Refill   Blood Glucose Monitoring Suppl (ONE TOUCH ULTRA 2) w/Device KIT Use as directed once daily E11.9 1 kit 0   Cholecalciferol (THERA-D 2000) 50 MCG (2000 UT) TABS 1 tab by mouth once daily 30 tablet 99   Coenzyme Q10 (CO Q-10) 100 MG CAPS Take 1 capsule by mouth daily.     dexlansoprazole (DEXILANT) 60 MG capsule Take 1 capsule (60 mg total) by mouth daily. 90 capsule 3   Epinastine HCl 0.05 % ophthalmic solution SMARTSIG:1 Drop(s) In Eye(s) Twice Daily PRN     Evolocumab (REPATHA SURECLICK) 140 MG/ML SOAJ Inject  140 mg into the skin every 14 (fourteen) days. 6 mL 3   Flaxseed, Linseed, (FLAX SEED OIL PO) Take by mouth.     fluticasone (FLONASE) 50 MCG/ACT nasal spray SPRAY 2 SPRAYS INTO EACH NOSTRIL DAILY 48 mL 3   glucose blood (ONETOUCH ULTRA) test strip Use as instructed once daily E11.9 100 each 12   Lancets MISC Use as directed once daily E11.9 100 each 3   losartan (COZAAR) 50 MG tablet Take 1 tablet (50 mg total) by mouth daily. 90 tablet 3   Multiple  Vitamin (MULTIVITAMIN) tablet Take 1 tablet by mouth daily.     Omega-3 Fatty Acids (FISH OIL) 500 MG CAPS Take 1 capsule by mouth daily.     rosuvastatin (CRESTOR) 40 MG tablet TAKE 1 TABLET BY MOUTH EVERY OTHER DAY 45 tablet 3   sildenafil (VIAGRA) 100 MG tablet Take 0.5-1 tablets (50-100 mg total) by mouth daily as needed for erectile dysfunction. 5 tablet 11   No current facility-administered medications for this visit.   Allergies  Allergen Reactions   Aspirin     REACTION: hives States tolerates IBU & acetaminophen OK   Other     Walnuts & pecans cause hives   Codeine     Itching with codeine containing cough syrup 9/15   Pecan Nut (Diagnostic) Hives   Zetia [Ezetimibe]     See 09/22/14 musculoskeletal pains   Ezetimibe-Simvastatin Hives    REACTION: myalgia    CBC    Component Value Date/Time   WBC 4.4 06/28/2022 1016   RBC 5.06 06/28/2022 1016   HGB 14.1 06/28/2022 1016   HCT 42.7 06/28/2022 1016   PLT 188.0 06/28/2022 1016   MCV 84.5 06/28/2022 1016   MCH 28.2 04/16/2019 1643   MCHC 33.1 06/28/2022 1016   RDW 13.9 06/28/2022 1016   LYMPHSABS 1.2 06/28/2022 1016   MONOABS 0.4 06/28/2022 1016   EOSABS 0.1 06/28/2022 1016   BASOSABS 0.0 06/28/2022 1016    CMP     Component Value Date/Time   NA 142 06/28/2022 1016   K 4.7 06/28/2022 1016   CL 105 06/28/2022 1016   CO2 31 06/28/2022 1016   GLUCOSE 111 (H) 06/28/2022 1016   BUN 14 06/28/2022 1016   CREATININE 1.00 06/28/2022 1016   CREATININE 1.09 04/16/2019  1643   CALCIUM 9.4 06/28/2022 1016   PROT 7.4 06/28/2022 1016   ALBUMIN 4.4 06/28/2022 1016   AST 21 06/28/2022 1016   ALT 29 06/28/2022 1016   ALKPHOS 48 06/28/2022 1016   BILITOT 0.5 06/28/2022 1016   GFRNONAA >60 06/29/2014 0855   GFRAA >60 06/29/2014 0855       Latest Ref Rng & Units 06/28/2022   10:16 AM 12/28/2021   10:02 AM 06/27/2021    8:49 AM  CBC EXTENDED  WBC 4.0 - 10.5 K/uL 4.4  4.3  3.6   RBC 4.22 - 5.81 Mil/uL 5.06  4.90  4.79   Hemoglobin 13.0 - 17.0 g/dL 16.1  09.6  04.5   HCT 39.0 - 52.0 % 42.7  41.9  40.5   Platelets 150.0 - 400.0 K/uL 188.0  189.0  170.0   NEUT# 1.4 - 7.7 K/uL 2.7  2.5  1.7   Lymph# 0.7 - 4.0 K/uL 1.2  1.3  1.4

## 2022-07-18 ENCOUNTER — Other Ambulatory Visit: Payer: Self-pay

## 2022-07-18 ENCOUNTER — Other Ambulatory Visit: Payer: Self-pay | Admitting: Internal Medicine

## 2022-10-09 NOTE — Progress Notes (Unsigned)
   Kevin Payor, PhD, LAT, ATC acting as a scribe for Kevin Graham, MD.  Kevin Farley is a 67 y.o. male who presents to [facility] today for R foot pain. Pt was previously seen by Dr. Denyse Amass on 07/11/21 for R knee pain.  Today, pt c/o R foot pain x ***. Pt locates pain to ***  Aggravates: Treatments tried:  Pertinent review of systems: ***  Relevant historical information: ***   Exam:  There were no vitals taken for this visit. General: Well Developed, well nourished, and in no acute distress.   MSK: ***    Lab and Radiology Results No results found for this or any previous visit (from the past 72 hour(s)). No results found.     Assessment and Plan: 67 y.o. male with ***   PDMP not reviewed this encounter. No orders of the defined types were placed in this encounter.  No orders of the defined types were placed in this encounter.    Discussed warning signs or symptoms. Please see discharge instructions. Patient expresses understanding.   ***

## 2022-10-10 ENCOUNTER — Encounter: Payer: Self-pay | Admitting: Family Medicine

## 2022-10-10 ENCOUNTER — Ambulatory Visit: Payer: Medicare Other | Admitting: Family Medicine

## 2022-10-10 ENCOUNTER — Ambulatory Visit (INDEPENDENT_AMBULATORY_CARE_PROVIDER_SITE_OTHER): Payer: Medicare Other

## 2022-10-10 VITALS — BP 140/92 | Ht 66.0 in

## 2022-10-10 DIAGNOSIS — M79671 Pain in right foot: Secondary | ICD-10-CM

## 2022-10-10 DIAGNOSIS — M722 Plantar fascial fibromatosis: Secondary | ICD-10-CM | POA: Diagnosis not present

## 2022-10-10 DIAGNOSIS — M79674 Pain in right toe(s): Secondary | ICD-10-CM | POA: Diagnosis not present

## 2022-10-10 NOTE — Patient Instructions (Addendum)
Thank you for coming in today.   Please get an Xray today before you leave   Gel heel cup or heel cushioning  Please complete the exercises that the athletic trainer went over with you:  View at [email or web address] using code: [email or web address]  Ice massage  Dancer's pad  Plantar fascia arch strap  Check back as needed

## 2022-10-17 ENCOUNTER — Encounter: Payer: Self-pay | Admitting: Family Medicine

## 2022-10-18 ENCOUNTER — Encounter: Payer: Self-pay | Admitting: Family Medicine

## 2022-10-18 NOTE — Progress Notes (Signed)
Right foot x-ray shows some bone spur or calcification as the Achilles tendon attaches to the back of the heel bone.

## 2022-10-21 ENCOUNTER — Other Ambulatory Visit: Payer: Self-pay | Admitting: Internal Medicine

## 2022-10-21 ENCOUNTER — Other Ambulatory Visit: Payer: Self-pay

## 2022-10-23 ENCOUNTER — Encounter: Payer: Self-pay | Admitting: Internal Medicine

## 2022-10-23 DIAGNOSIS — L989 Disorder of the skin and subcutaneous tissue, unspecified: Secondary | ICD-10-CM

## 2022-10-30 ENCOUNTER — Ambulatory Visit: Payer: Medicare Other | Admitting: Podiatry

## 2022-10-30 DIAGNOSIS — M722 Plantar fascial fibromatosis: Secondary | ICD-10-CM

## 2022-10-30 MED ORDER — METHYLPREDNISOLONE 4 MG PO TBPK: ORAL_TABLET | ORAL | 0 refills | Status: DC

## 2022-10-30 MED ORDER — MELOXICAM 15 MG PO TABS: 15.0000 mg | ORAL_TABLET | Freq: Every day | ORAL | 1 refills | Status: AC

## 2022-10-30 NOTE — Progress Notes (Signed)
   Chief Complaint  Patient presents with   Foot Pain    Pt presents for right foot pain.     Subjective: 67 y.o. male    Past Medical History:  Diagnosis Date   Allergic rhinitis    Colon polyps    adenomatous 2011 & 2014   Family history of ischemic heart disease    GERD (gastroesophageal reflux disease)    HTN (hypertension) 04/16/2019   Hyperlipidemia    Other abnormal glucose    A1c 6% in 06/2009   Supraspinatus tendon tear      Objective: Physical Exam General: The patient is alert and oriented x3 in no acute distress.  Dermatology: Skin is warm, dry and supple bilateral lower extremities. Negative for open lesions or macerations bilateral.   Vascular: Dorsalis Pedis and Posterior Tibial pulses palpable bilateral.  Capillary fill time is immediate to all digits.  Neurological: Epicritic and protective threshold intact bilateral.   Musculoskeletal: Tenderness to palpation to the plantar aspect of the right heel along the plantar fascia. All other joints range of motion within normal limits bilateral. Strength 5/5 in all groups bilateral.   DG Foot Complete Right 10/10/2022 FINDINGS: Normal bone mineralization. Joint spaces are preserved. Minimal chronic enthesopathic change at the Achilles insertion on the calcaneus. No acute fracture or dislocation. IMPRESSION: Minimal chronic enthesopathic change at the Achilles insertion on the calcaneus.  Assessment: 1. Plantar fasciitis right  Plan of Care:  -Patient evaluated. Xrays reviewed.   -No cortisone injection today. -Rx for Medrol Dose Pack placed -Rx for Meloxicam 15 mg ordered for patient. -OTC power step insoles dispensed.  Wear daily with tennis shoes.  Advised against going barefoot -Instructed patient regarding therapies and modalities at home to alleviate symptoms.  -Return to clinic in 4 weeks.    *[facility] [place] on [address] [place] [employer] in [facility], DPM [facility]  Dr. Felecia Shelling, DPM    [address] [place], [place] [address]                Office [phone]  Fax [phone]

## 2022-11-11 DIAGNOSIS — D225 Melanocytic nevi of trunk: Secondary | ICD-10-CM | POA: Diagnosis not present

## 2022-11-11 DIAGNOSIS — L603 Nail dystrophy: Secondary | ICD-10-CM | POA: Diagnosis not present

## 2022-11-11 DIAGNOSIS — D2261 Melanocytic nevi of right upper limb, including shoulder: Secondary | ICD-10-CM | POA: Diagnosis not present

## 2022-11-11 DIAGNOSIS — D2239 Melanocytic nevi of other parts of face: Secondary | ICD-10-CM | POA: Diagnosis not present

## 2022-11-11 DIAGNOSIS — D485 Neoplasm of uncertain behavior of skin: Secondary | ICD-10-CM | POA: Diagnosis not present

## 2022-11-11 DIAGNOSIS — L720 Epidermal cyst: Secondary | ICD-10-CM | POA: Diagnosis not present

## 2022-11-11 DIAGNOSIS — D2272 Melanocytic nevi of left lower limb, including hip: Secondary | ICD-10-CM | POA: Diagnosis not present

## 2022-12-02 ENCOUNTER — Ambulatory Visit: Payer: Medicare Other | Admitting: Podiatry

## 2022-12-18 ENCOUNTER — Encounter: Payer: Self-pay | Admitting: Internal Medicine

## 2022-12-18 ENCOUNTER — Ambulatory Visit: Payer: Medicare Other | Admitting: Podiatry

## 2022-12-18 ENCOUNTER — Ambulatory Visit (INDEPENDENT_AMBULATORY_CARE_PROVIDER_SITE_OTHER): Payer: Medicare Other | Admitting: Internal Medicine

## 2022-12-18 VITALS — BP 136/78 | HR 76 | Temp 98.6°F | Ht 66.0 in | Wt 140.0 lb

## 2022-12-18 DIAGNOSIS — Z789 Other specified health status: Secondary | ICD-10-CM | POA: Diagnosis not present

## 2022-12-18 DIAGNOSIS — Z125 Encounter for screening for malignant neoplasm of prostate: Secondary | ICD-10-CM

## 2022-12-18 DIAGNOSIS — I10 Essential (primary) hypertension: Secondary | ICD-10-CM

## 2022-12-18 DIAGNOSIS — R7303 Prediabetes: Secondary | ICD-10-CM

## 2022-12-18 DIAGNOSIS — L989 Disorder of the skin and subcutaneous tissue, unspecified: Secondary | ICD-10-CM | POA: Diagnosis not present

## 2022-12-18 DIAGNOSIS — E559 Vitamin D deficiency, unspecified: Secondary | ICD-10-CM | POA: Diagnosis not present

## 2022-12-18 DIAGNOSIS — Z23 Encounter for immunization: Secondary | ICD-10-CM | POA: Diagnosis not present

## 2022-12-18 DIAGNOSIS — E782 Mixed hyperlipidemia: Secondary | ICD-10-CM

## 2022-12-18 LAB — POCT GLYCOSYLATED HEMOGLOBIN (HGB A1C): Hemoglobin A1C: 5.9 % — AB (ref 4.0–5.6)

## 2022-12-18 MED ORDER — LOSARTAN POTASSIUM 50 MG PO TABS: 50.0000 mg | ORAL_TABLET | Freq: Every day | ORAL | 3 refills | Status: DC

## 2022-12-18 MED ORDER — NEXLIZET 180-10 MG PO TABS: ORAL_TABLET | ORAL | 3 refills | Status: DC

## 2022-12-18 NOTE — Patient Instructions (Addendum)
Your A1c was done today  Ok to change the zetia to the nexlizet if not too expensive, or at least after Jan 1  Please continue all other medications as before, and refills have been done if requested.  Please have the pharmacy call with any other refills you may need.  Please continue your efforts at being more active, low cholesterol diet, and weight control.  Please keep your appointments with your specialists as you may have planned - dermatology for the followup wider excision  Please make an Appointment to return in 6 months, or sooner if needed, also with Lab Appointment for testing done 3-5 days before at the FIRST FLOOR Lab (so this is for TWO appointments - please see the scheduling desk as you leave)

## 2022-12-18 NOTE — Progress Notes (Unsigned)
Patient ID: Kevin Farley, male   DOB: Mar 07, 1955, 67 y.o.   MRN: [id number]       Chief Complaint: follow up htn, hld, low vit d, skin lesion, hyperglycemia, medication non compliance       HPI:  Kevin Farley is a 67 y.o. male here with wife, now admits to spotty med taking and sometimes half pills, such as spotty taking losartan 25 mg instead of 50 mg  Pt denies chest pain, increased sob or doe, wheezing, orthopnea, PND, increased LE swelling, palpitations, dizziness or syncope.  Pt denies polydipsia, polyuria, or new focal neuro s/s.    Pt denies fever, wt loss, night sweats, loss of appetite, or other constitutional symptoms  Did not take repatha as animal products was mentioned in the drug information.  Had recent distal LLE skin lesion removed last month, path with atypical cells, pt contacted to have wider excision but he is wary of the need for doing this. Due for flu shot.  Wt Readings from Last 3 Encounters:  12/18/22 140 lb (63.5 kg)  06/28/22 139 lb (63 kg)  05/28/22 139 lb (63 kg)   BP Readings from Last 3 Encounters:  12/18/22 136/78  10/10/22 (!) 140/92  06/28/22 138/80         Past Medical History:  Diagnosis Date   Allergic rhinitis    Colon polyps    adenomatous 2011 & 2014   Family history of ischemic heart disease    GERD (gastroesophageal reflux disease)    HTN (hypertension) 04/16/2019   Hyperlipidemia    Other abnormal glucose    A1c 6% in 06/2009   Supraspinatus tendon tear    Past Surgical History:  Procedure Laterality Date   BACK SURGERY  02/12/2004   Dr Channing Mutters, [place]   cardic catherization  02/12/2003   negative; Dr Marni Griffon   colon polyps  2011 & 2014   Dr.Jacobs    COLONOSCOPY     CORNEAL TRANSPLANT Left 02/12/1991   Dr Dagoberto Ligas   nasal bone surgery  02/11/2005   UPPER GASTROINTESTINAL ENDOSCOPY  02/11/2009   mild gastritis; Dr Christella Hartigan    reports that he has never smoked. He has never used smokeless tobacco. He reports current alcohol use of about 7.0  standard drinks of alcohol per week. He reports that he does not use drugs. family history includes Diabetes in his brother, brother, father, mother, and sister; Heart attack in his brother and father; Hypertension in his brother, father, mother, and sister; Kidney failure in his brother and brother; Stroke in his mother; Transient ischemic attack in his brother. Allergies  Allergen Reactions   Aspirin     REACTION: hives States tolerates IBU & acetaminophen OK   Other     Walnuts & pecans cause hives   Codeine     Itching with codeine containing cough syrup 9/15   Pecan Nut (Diagnostic) Hives   Zetia [Ezetimibe]     See 09/22/14 musculoskeletal pains   Ezetimibe-Simvastatin Hives    REACTION: myalgia   Current Outpatient Medications on File Prior to Visit  Medication Sig Dispense Refill   Blood Glucose Monitoring Suppl (ONE TOUCH ULTRA 2) w/Device KIT Use as directed once daily E11.9 1 kit 0   Coenzyme Q10 (CO Q-10) 100 MG CAPS Take 1 capsule by mouth daily.     dexlansoprazole (DEXILANT) 60 MG capsule TAKE 1 CAPSULE BY MOUTH EVERY DAY 90 capsule 3   Epinastine HCl 0.05 % ophthalmic solution SMARTSIG:1 Drop(s) In  Eye(s) Twice Daily PRN     Flaxseed, Linseed, (FLAX SEED OIL PO) Take by mouth.     fluticasone (FLONASE) 50 MCG/ACT nasal spray SPRAY 2 SPRAYS INTO EACH NOSTRIL DAILY 48 mL 3   Lancets MISC Use as directed once daily E11.9 100 each 3   meloxicam (MOBIC) 15 MG tablet Take 1 tablet (15 mg total) by mouth daily. 60 tablet 1   methylPREDNISolone (MEDROL DOSEPAK) 4 MG TBPK tablet 6 day dose pack - take as directed 21 tablet 0   Multiple Vitamin (MULTIVITAMIN) tablet Take 1 tablet by mouth daily.     Omega-3 Fatty Acids (FISH OIL) 500 MG CAPS Take 1 capsule by mouth daily.     ONETOUCH ULTRA test strip USE AS INSTRUCTED ONCE DAILY E11.9 100 strip 12   sildenafil (VIAGRA) 100 MG tablet Take 0.5-1 tablets (50-100 mg total) by mouth daily as needed for erectile dysfunction. 5 tablet  11   [DISCONTINUED] Lansoprazole (PREVACID PO) Take by mouth daily.       [DISCONTINUED] omeprazole (PRILOSEC) 40 MG capsule Take 1 capsule (40 mg total) by mouth daily. 180 capsule 1   No current facility-administered medications on file prior to visit.        ROS:  All others reviewed and negative.  Objective        PE:  BP 136/78 (BP Location: Left Arm, Patient Position: Sitting, Cuff Size: Normal)   Pulse 76   Temp 98.6 F (37 C) (Oral)   Ht 5\' 6"  (1.676 m)   Wt 140 lb (63.5 kg)   SpO2 98%   BMI 22.60 kg/m                 Constitutional: Pt appears in NAD               HENT: Head: NCAT.                Right Ear: External ear normal.                 Left Ear: External ear normal.                Eyes: . Pupils are equal, round, and reactive to light. Conjunctivae and EOM are normal               Nose: without d/c or deformity               Neck: Neck supple. Gross normal ROM               Cardiovascular: Normal rate and regular rhythm.                 Pulmonary/Chest: Effort normal and breath sounds without rales or wheezing.                Abd:  Soft, NT, ND, + BS, no organomegaly               Neurological: Pt is alert. At baseline orientation, motor grossly intact               Skin: Skin is warm. No rashes, no other new lesions, LE edema - none               Psychiatric: Pt behavior is normal without agitation   Micro: none  Cardiac tracings I have personally interpreted today:  none  Pertinent Radiological findings (summarize): none   Lab Results  Component Value Date   WBC  4.4 06/28/2022   HGB 14.1 06/28/2022   HCT 42.7 06/28/2022   PLT 188.0 06/28/2022   GLUCOSE 111 (H) 06/28/2022   CHOL 204 (H) 06/28/2022   TRIG 106.0 06/28/2022   HDL 64.70 06/28/2022   LDLDIRECT 150.0 11/18/2019   LDLCALC 118 (H) 06/28/2022   ALT 29 06/28/2022   AST 21 06/28/2022   NA 142 06/28/2022   K 4.7 06/28/2022   CL 105 06/28/2022   CREATININE 1.00 06/28/2022   BUN 14  06/28/2022   CO2 31 06/28/2022   TSH 2.47 06/28/2022   PSA 1.18 06/28/2022   HGBA1C 5.9 (A) 12/18/2022   Hemoglobin A1C 4.0 - 5.6 % 5.9 Abnormal    Assessment/Plan:  Kevin Farley is a 67 y.o. Other or two or more races [6] male with  has a past medical history of Allergic rhinitis, Colon polyps, Family history of ischemic heart disease, GERD (gastroesophageal reflux disease), HTN (hypertension) (04/16/2019), Hyperlipidemia, Other abnormal glucose, and Supraspinatus tendon tear.  Statin intolerance Unable to tolerate statin per pt  Pre-diabetes Lab Results  Component Value Date   HGBA1C 5.9 (A) 12/18/2022   Stable, pt to continue current medical treatment  - diet, wt control   Hyperlipidemia Lab Results  Component Value Date   LDLCALC 118 (H) 06/28/2022   Uncontrolled, goal ldl < 70,, pt to start nexlizet 1 qd   HTN (hypertension) BP Readings from Last 3 Encounters:  12/18/22 136/78  10/10/22 (!) 140/92  06/28/22 138/80   uncontrolled pt to take losartan 50 mg every day , encouraged good compliance  Skin lesion Pt encouraged for f/u with derm for wider excision lesion with atypical cells  Vitamin D deficiency Last vitamin D Lab Results  Component Value Date   VD25OH 31.07 06/28/2022   Low, to start oral replacement  Followup: Return in about 6 months (around 06/17/2023).  Oliver Barre, MD 12/19/2022 8:53 PM [facility]

## 2022-12-19 ENCOUNTER — Encounter: Payer: Self-pay | Admitting: Internal Medicine

## 2022-12-19 DIAGNOSIS — E559 Vitamin D deficiency, unspecified: Secondary | ICD-10-CM | POA: Insufficient documentation

## 2022-12-19 DIAGNOSIS — L989 Disorder of the skin and subcutaneous tissue, unspecified: Secondary | ICD-10-CM | POA: Insufficient documentation

## 2022-12-19 NOTE — Assessment & Plan Note (Signed)
Last vitamin D Lab Results  Component Value Date   VD25OH 31.07 06/28/2022   Low, to start oral replacement

## 2022-12-19 NOTE — Assessment & Plan Note (Signed)
Pt encouraged for f/u with derm for wider excision lesion with atypical cells

## 2022-12-19 NOTE — Assessment & Plan Note (Signed)
Lab Results  Component Value Date   HGBA1C 5.9 (A) 12/18/2022   Stable, pt to continue current medical treatment  - diet, wt control

## 2022-12-19 NOTE — Assessment & Plan Note (Signed)
Lab Results  Component Value Date   LDLCALC 118 (H) 06/28/2022   Uncontrolled, goal ldl < 70,, pt to start nexlizet 1 qd

## 2022-12-19 NOTE — Assessment & Plan Note (Signed)
BP Readings from Last 3 Encounters:  12/18/22 136/78  10/10/22 (!) 140/92  06/28/22 138/80   uncontrolled pt to take losartan 50 mg every day , encouraged good compliance

## 2022-12-19 NOTE — Addendum Note (Signed)
Addended by: Corwin Levins on: 12/19/2022 08:54 PM   Modules accepted: Orders

## 2022-12-19 NOTE — Assessment & Plan Note (Signed)
Unable to tolerate statin per pt

## 2022-12-26 ENCOUNTER — Other Ambulatory Visit: Payer: Self-pay | Admitting: Internal Medicine

## 2022-12-27 DIAGNOSIS — D487 Neoplasm of uncertain behavior of other specified sites: Secondary | ICD-10-CM | POA: Diagnosis not present

## 2022-12-27 DIAGNOSIS — D485 Neoplasm of uncertain behavior of skin: Secondary | ICD-10-CM | POA: Diagnosis not present

## 2023-01-21 ENCOUNTER — Other Ambulatory Visit (HOSPITAL_COMMUNITY): Payer: Self-pay

## 2023-01-21 ENCOUNTER — Telehealth: Payer: Self-pay

## 2023-01-21 NOTE — Telephone Encounter (Signed)
*  Primary  Pharmacy Patient Advocate Encounter   Received notification from CoverMyMeds that prior authorization for Nexlizet 180-10MG  tablets  is required/requested.   Insurance verification completed.   The patient is insured through [facility] .   Per test claim: PA required; PA submitted to above mentioned insurance via CoverMyMeds Key/confirmation #/EOC [id number] Status is pending

## 2023-01-21 NOTE — Telephone Encounter (Signed)
Pharmacy Patient Advocate Encounter  Received notification from [facility] that Prior Authorization for Nexlizet 180-10mg  has been APPROVED from 01/21/23 to 07/22/23   PA #/Case ID/Reference #: [id number]  Left a voicemail at [facility] to notify of the approval.

## 2023-02-18 ENCOUNTER — Encounter: Payer: Self-pay | Admitting: Internal Medicine

## 2023-02-18 MED ORDER — LOSARTAN POTASSIUM 100 MG PO TABS
100.0000 mg | ORAL_TABLET | Freq: Every day | ORAL | 3 refills | Status: DC
  Filled 2023-11-10 – 2023-11-20 (×2): qty 90, 90d supply, fill #0

## 2023-03-05 ENCOUNTER — Ambulatory Visit: Payer: Medicare Other

## 2023-03-05 VITALS — BP 150/90 | HR 78 | Ht 67.25 in | Wt 140.6 lb

## 2023-03-05 DIAGNOSIS — Z0000 Encounter for general adult medical examination without abnormal findings: Secondary | ICD-10-CM | POA: Diagnosis not present

## 2023-03-05 NOTE — Patient Instructions (Signed)
[name] , Thank you for taking time to come for your Medicare Wellness Visit. I appreciate your ongoing commitment to your health goals. Please review the following plan we discussed and let me know if I can assist you in the future.   Referrals/Orders/Follow-Ups/Clinician Recommendations: It was nice to meet you today. You are due for a Tetanus vaccine and a Shingles vaccine.  You can get this done at your local pharmacy.  Keep up the good work.  This is a list of the screening recommended for you and due dates:  Health Maintenance  Topic Date Due   Zoster (Shingles) Vaccine (1 of 2) Never done   DTaP/Tdap/Td vaccine (2 - Td or Tdap) 10/18/2020   Medicare Annual Wellness Visit  03/04/2024   Colon Cancer Screening  04/16/2025   Pneumonia Vaccine  Completed   Flu Shot  Completed   Hepatitis C Screening  Completed   HPV Vaccine  Aged Out   COVID-19 Vaccine  Discontinued    Advanced directives: (Copy Requested) Please bring a copy of your health care power of attorney and living will to the office to be added to your chart at your convenience.  Next Medicare Annual Wellness Visit scheduled for next year: Yes

## 2023-03-05 NOTE — Progress Notes (Signed)
Subjective:   Kevin Farley is a 68 y.o. male who presents for an Initial Medicare Annual Wellness Visit.  Visit Complete: In person   Cardiac Risk Factors include: advanced age (>60men, >87 women);hypertension;male gender;dyslipidemia     Objective:    Today's Vitals   03/05/23 0836  BP: (!) 150/90  Pulse: 78  SpO2: 100%  Weight: 140 lb 9.6 oz (63.8 kg)  Height: 5' 7.25" (1.708 m)   Body mass index is 21.86 kg/m.     03/05/2023    8:19 AM 01/10/2021    4:28 PM 03/14/2016    8:49 AM 06/29/2014    8:21 AM  Advanced Directives  Does Patient Have a Medical Advance Directive? Yes No No No  Type of Estate agent of [place];Living will     Copy of Healthcare Power of Attorney in Chart? No - copy requested     Would patient like information on creating a medical advance directive?  No - Patient declined No - Patient declined No - patient declined information    Current Medications (verified) Outpatient Encounter Medications as of 03/05/2023  Medication Sig   Bempedoic Acid-Ezetimibe (NEXLIZET) 180-10 MG TABS 1 tab by mouth once daily   Blood Glucose Monitoring Suppl (ONE TOUCH ULTRA 2) w/Device KIT Use as directed once daily E11.9   Coenzyme Q10 (CO Q-10) 100 MG CAPS Take 1 capsule by mouth daily.   dexlansoprazole (DEXILANT) 60 MG capsule TAKE 1 CAPSULE BY MOUTH EVERY DAY   Epinastine HCl 0.05 % ophthalmic solution SMARTSIG:1 Drop(s) In Eye(s) Twice Daily PRN   Flaxseed, Linseed, (FLAX SEED OIL PO) Take by mouth.   fluticasone (FLONASE) 50 MCG/ACT nasal spray SPRAY 2 SPRAYS INTO EACH NOSTRIL DAILY   Lancets MISC Use as directed once daily E11.9   losartan (COZAAR) 100 MG tablet Take 1 tablet (100 mg total) by mouth daily.   methylPREDNISolone (MEDROL DOSEPAK) 4 MG TBPK tablet 6 day dose pack - take as directed   Multiple Vitamin (MULTIVITAMIN) tablet Take 1 tablet by mouth daily.   Omega-3 Fatty Acids (FISH OIL) 500 MG CAPS Take 1 capsule by mouth  daily.   ONETOUCH ULTRA test strip USE AS INSTRUCTED ONCE DAILY E11.9   sildenafil (VIAGRA) 100 MG tablet Take 0.5-1 tablets (50-100 mg total) by mouth daily as needed for erectile dysfunction.   [DISCONTINUED] Lansoprazole (PREVACID PO) Take by mouth daily.     [DISCONTINUED] omeprazole (PRILOSEC) 40 MG capsule Take 1 capsule (40 mg total) by mouth daily.   No facility-administered encounter medications on file as of 03/05/2023.    Allergies (verified) Aspirin, Other, Codeine, Pecan nut (diagnostic), Zetia [ezetimibe], and Ezetimibe-simvastatin   History: Past Medical History:  Diagnosis Date   Allergic rhinitis    Colon polyps    adenomatous 2011 & 2014   Family history of ischemic heart disease    GERD (gastroesophageal reflux disease)    HTN (hypertension) 04/16/2019   Hyperlipidemia    Other abnormal glucose    A1c 6% in 06/2009   Supraspinatus tendon tear    Past Surgical History:  Procedure Laterality Date   BACK SURGERY  02/12/2004   Dr Channing Mutters, [place]   cardic catherization  02/12/2003   negative; Dr Marni Griffon   colon polyps  2011 & 2014   Dr.Jacobs    COLONOSCOPY     CORNEAL TRANSPLANT Left 02/12/1991   Dr Dagoberto Ligas   nasal bone surgery  02/11/2005   UPPER GASTROINTESTINAL ENDOSCOPY  02/11/2009   mild gastritis; Dr Christella Hartigan   Family History  Problem Relation Age of Onset   Hypertension Mother    Diabetes Mother    Stroke Mother        in 71s   Diabetes Father    Hypertension Father    Heart attack Father        had open heart surgery   Diabetes Sister         X 2   Hypertension Sister    Diabetes Brother        X2   Heart attack Brother         MI in 59s   Transient ischemic attack Brother        in 54s   Kidney failure Brother    Hypertension Brother    Kidney failure Brother        renal transplant   Diabetes Brother    COPD Neg Hx    Asthma Neg Hx    Cancer Neg Hx    Colon polyps Neg Hx    Colon cancer Neg Hx    Esophageal cancer Neg Hx    Rectal  cancer Neg Hx    Stomach cancer Neg Hx    Social History   Socioeconomic History   Marital status: Married    Spouse name: Sherri   Number of children: 3   Years of education: 16   Highest education level: Not on file  Occupational History   Occupation: Production designer, theatre/television/film of hotels    Occupation: OWNER    Employer: [employer]  Tobacco Use   Smoking status: Never   Smokeless tobacco: Never  Vaping Use   Vaping status: Never Used  Substance and Sexual Activity   Alcohol use: Yes    Alcohol/week: 7.0 standard drinks of alcohol    Types: 7 Glasses of wine per week    Comment: red wine with dinner   Drug use: No   Sexual activity: Not on file  Other Topics Concern   Not on file  Social History Narrative   Never smoked, 1 child at home    Lives with wife-2025   Social Drivers of Health   Financial Resource Strain: Low Risk  (03/05/2023)   Overall Financial Resource Strain (CARDIA)    Difficulty of Paying Living Expenses: Not hard at all  Food Insecurity: No Food Insecurity (03/05/2023)   Hunger Vital Sign    Worried About Running Out of Food in the Last Year: Never true    Ran Out of Food in the Last Year: Never true  Transportation Needs: No Transportation Needs (03/05/2023)   PRAPARE - Administrator, Civil Service (Medical): No    Lack of Transportation (Non-Medical): No  Physical Activity: Sufficiently Active (03/05/2023)   Exercise Vital Sign    Days of Exercise per Week: 4 days    Minutes of Exercise per Session: 40 min  Stress: No Stress Concern Present (03/05/2023)   [employer] of Occupational Health - Occupational Stress Questionnaire    Feeling of Stress : Only a little  Social Connections: Socially Integrated (03/05/2023)   Social Connection and Isolation Panel [NHANES]    Frequency of Communication with Friends and Family: More than three times a week    Frequency of Social Gatherings with Friends and Family: More than three times a week    Attends  Religious Services: More than 4 times per year    Active Member of Clubs or Organizations: Yes  Attends Engineer, structural: More than 4 times per year    Marital Status: Married    Tobacco Counseling Counseling given: Not Answered   Clinical Intake:  Pre-visit preparation completed: Yes  Pain : No/denies pain     Nutritional Risks: None Diabetes: No  How often do you need to have someone help you when you read instructions, pamphlets, or other written materials from your doctor or pharmacy?: 1 - Never  Interpreter Needed?: No  Information entered by :: Nefertiti Mohamad, RMA   Activities of Daily Living    03/05/2023    8:01 AM  In your present state of health, do you have any difficulty performing the following activities:  Hearing? 0  Vision? 0  Difficulty concentrating or making decisions? 0  Walking or climbing stairs? 0  Dressing or bathing? 0  Doing errands, shopping? 0  Preparing Food and eating ? N  Using the Toilet? N  In the past six months, have you accidently leaked urine? N  Do you have problems with loss of bowel control? N  Managing your Medications? N  Managing your Finances? N  Housekeeping or managing your Housekeeping? N    Patient Care Team: Corwin Levins, MD as PCP - General (Internal Medicine)  Indicate any recent Medical Services you may have received from other than [facility] providers in the past year (date may be approximate).     Assessment:   This is a routine wellness examination for Kevin Farley.  Hearing/Vision screen Hearing Screening - Comments:: Denies hearing difficulties   Vision Screening - Comments:: Wears eyeglasses for reading   Goals Addressed   None   Depression Screen    03/05/2023    8:24 AM 12/18/2022    8:50 AM 06/28/2022    8:50 AM 05/28/2022    3:32 PM 12/28/2021    9:39 AM 12/28/2021    9:01 AM 06/27/2021    8:15 AM  PHQ 2/9 Scores  PHQ - 2 Score 0 0 0 0 0 0 0  PHQ- 9 Score 0  0 2  0     Fall  Risk    03/05/2023    8:20 AM 12/18/2022    8:50 AM 06/28/2022    8:50 AM 05/28/2022    3:32 PM 12/28/2021    9:39 AM  Fall Risk   Falls in the past year? 0 0 0 0 0  Number falls in past yr: 0 0 0 0 0  Injury with Fall? 0 0 0 0 0  Risk for fall due to : No Fall Risks No Fall Risks No Fall Risks No Fall Risks   Follow up Falls prevention discussed;Falls evaluation completed Falls evaluation completed Falls evaluation completed Falls evaluation completed     MEDICARE RISK AT HOME: Medicare Risk at Home Any stairs in or around the home?: Yes (in garage) If so, are there any without handrails?: Yes Home free of loose throw rugs in walkways, pet beds, electrical cords, etc?: Yes Adequate lighting in your home to reduce risk of falls?: Yes Life alert?: No Use of a cane, walker or w/c?: No Grab bars in the bathroom?: No Shower chair or bench in shower?: No Elevated toilet seat or a handicapped toilet?: No  TIMED UP AND GO:  Was the test performed? Yes  Length of time to ambulate 10 feet: 10 sec Gait steady and fast without use of assistive device    Cognitive Function:        03/05/2023  8:21 AM  6CIT Screen  What Year? 0 points  What month? 0 points  What time? 0 points  Count back from 20 0 points  Months in reverse 0 points  Repeat phrase 0 points  Total Score 0 points    Immunizations Immunization History  Administered Date(s) Administered   Fluad Quad(high Dose 65+) 12/28/2021   Fluad Trivalent(High Dose 65+) 12/18/2022   Influenza Split 11/08/2011   Influenza,inj,Quad PF,6+ Mos 10/21/2012, 10/27/2013, 01/20/2015, 01/09/2016, 11/21/2016, 12/16/2017, 10/30/2018, 11/09/2020   Influenza-Unspecified 11/21/2016, 02/25/2020   PFIZER(Purple Top)SARS-COV-2 Vaccination 04/23/2019, 05/17/2019, 02/08/2020   PNEUMOCOCCAL CONJUGATE-20 06/28/2022   Tdap 10/19/2010    TDAP status: Due, Education has been provided regarding the importance of this vaccine. Advised may receive  this vaccine at local pharmacy or Health Dept. Aware to provide a copy of the vaccination record if obtained from local pharmacy or Health Dept. Verbalized acceptance and understanding.  Flu Vaccine status: Up to date  Pneumococcal vaccine status: Up to date  Covid-19 vaccine status: Declined, Education has been provided regarding the importance of this vaccine but patient still declined. Advised may receive this vaccine at local pharmacy or Health Dept.or vaccine clinic. Aware to provide a copy of the vaccination record if obtained from local pharmacy or Health Dept. Verbalized acceptance and understanding.  Qualifies for Shingles Vaccine? Yes   Zostavax completed No   Shingrix Completed?: No.    Education has been provided regarding the importance of this vaccine. Patient has been advised to call insurance company to determine out of pocket expense if they have not yet received this vaccine. Advised may also receive vaccine at local pharmacy or Health Dept. Verbalized acceptance and understanding.  Screening Tests Health Maintenance  Topic Date Due   Zoster Vaccines- Shingrix (1 of 2) Never done   DTaP/Tdap/Td (2 - Td or Tdap) 10/18/2020   Medicare Annual Wellness (AWV)  03/04/2024   Colonoscopy  04/16/2025   Pneumonia Vaccine 27+ Years old  Completed   INFLUENZA VACCINE  Completed   Hepatitis C Screening  Completed   HPV VACCINES  Aged Out   COVID-19 Vaccine  Discontinued    Health Maintenance  Health Maintenance Due  Topic Date Due   Zoster Vaccines- Shingrix (1 of 2) Never done   DTaP/Tdap/Td (2 - Td or Tdap) 10/18/2020    Colorectal cancer screening: Type of screening: Colonoscopy. Completed 04/17/2022. Repeat every 3 years  Lung Cancer Screening: (Low Dose CT Chest recommended if Age 68-80 years, 20 pack-year currently smoking OR have quit w/in 15years.) does not qualify.   Lung Cancer Screening Referral: N/A  Additional Screening:  Hepatitis C Screening: does  qualify; Completed 07/13/2008  Vision Screening: Recommended annual ophthalmology exams for early detection of glaucoma and other disorders of the eye. Is the patient up to date with their annual eye exam?  Yes  Who is the provider or what is the name of the office in which the patient attends annual eye exams? [facility] opthalmology If pt is not established with a provider, would they like to be referred to a provider to establish care? No .   Dental Screening: Recommended annual dental exams for proper oral hygiene    Community Resource Referral / Chronic Care Management: CRR required this visit?  No   CCM required this visit?  No    Plan:     I have personally reviewed and noted the following in the patient's chart:   Medical and social history Use of alcohol, tobacco or  illicit drugs  Current medications and supplements including opioid prescriptions. Patient is not currently taking opioid prescriptions. Functional ability and status Nutritional status Physical activity Advanced directives List of other physicians Hospitalizations, surgeries, and ER visits in previous 12 months Vitals Screenings to include cognitive, depression, and falls Referrals and appointments  In addition, I have reviewed and discussed with patient certain preventive protocols, quality metrics, and best practice recommendations. A written personalized care plan for preventive services as well as general preventive health recommendations were provided to patient.     Meelah Tallo L Kenley Rettinger, CMA   03/05/2023   After Visit Summary: (MyChart) Due to this being a telephonic visit, the after visit summary with patients personalized plan was offered to patient via MyChart   Nurse Notes: Patient is due for a Tdap and a Shingrix vaccine.  He Is up to date on all other health maintenance with no concerns to address today.

## 2023-03-11 ENCOUNTER — Encounter: Payer: Self-pay | Admitting: Internal Medicine

## 2023-03-11 ENCOUNTER — Ambulatory Visit: Payer: Medicare Other | Admitting: Internal Medicine

## 2023-03-11 VITALS — BP 148/88 | HR 75 | Temp 98.1°F | Ht 67.25 in | Wt 138.0 lb

## 2023-03-11 DIAGNOSIS — E559 Vitamin D deficiency, unspecified: Secondary | ICD-10-CM | POA: Diagnosis not present

## 2023-03-11 DIAGNOSIS — Z125 Encounter for screening for malignant neoplasm of prostate: Secondary | ICD-10-CM | POA: Diagnosis not present

## 2023-03-11 DIAGNOSIS — Z0001 Encounter for general adult medical examination with abnormal findings: Secondary | ICD-10-CM

## 2023-03-11 DIAGNOSIS — R7303 Prediabetes: Secondary | ICD-10-CM

## 2023-03-11 DIAGNOSIS — E782 Mixed hyperlipidemia: Secondary | ICD-10-CM

## 2023-03-11 DIAGNOSIS — R931 Abnormal findings on diagnostic imaging of heart and coronary circulation: Secondary | ICD-10-CM | POA: Diagnosis not present

## 2023-03-11 DIAGNOSIS — F5101 Primary insomnia: Secondary | ICD-10-CM | POA: Diagnosis not present

## 2023-03-11 DIAGNOSIS — I10 Essential (primary) hypertension: Secondary | ICD-10-CM

## 2023-03-11 DIAGNOSIS — M722 Plantar fascial fibromatosis: Secondary | ICD-10-CM | POA: Diagnosis not present

## 2023-03-11 LAB — CBC WITH DIFFERENTIAL/PLATELET
Basophils Absolute: 0 10*3/uL (ref 0.0–0.1)
Basophils Relative: 0.6 % (ref 0.0–3.0)
Eosinophils Absolute: 0.1 10*3/uL (ref 0.0–0.7)
Eosinophils Relative: 1.7 % (ref 0.0–5.0)
HCT: 42.9 % (ref 39.0–52.0)
Hemoglobin: 14 g/dL (ref 13.0–17.0)
Lymphocytes Relative: 25.5 % (ref 12.0–46.0)
Lymphs Abs: 1.4 10*3/uL (ref 0.7–4.0)
MCHC: 32.7 g/dL (ref 30.0–36.0)
MCV: 85.1 fL (ref 78.0–100.0)
Monocytes Absolute: 0.5 10*3/uL (ref 0.1–1.0)
Monocytes Relative: 8.5 % (ref 3.0–12.0)
Neutro Abs: 3.6 10*3/uL (ref 1.4–7.7)
Neutrophils Relative %: 63.7 % (ref 43.0–77.0)
Platelets: 265 10*3/uL (ref 150.0–400.0)
RBC: 5.04 Mil/uL (ref 4.22–5.81)
RDW: 13 % (ref 11.5–15.5)
WBC: 5.6 10*3/uL (ref 4.0–10.5)

## 2023-03-11 LAB — URINALYSIS, ROUTINE W REFLEX MICROSCOPIC
Bilirubin Urine: NEGATIVE
Hgb urine dipstick: NEGATIVE
Ketones, ur: NEGATIVE
Leukocytes,Ua: NEGATIVE
Nitrite: NEGATIVE
RBC / HPF: NONE SEEN (ref 0–?)
Specific Gravity, Urine: 1.015 (ref 1.000–1.030)
Total Protein, Urine: NEGATIVE
Urine Glucose: NEGATIVE
Urobilinogen, UA: 0.2 (ref 0.0–1.0)
pH: 8 (ref 5.0–8.0)

## 2023-03-11 LAB — BASIC METABOLIC PANEL
BUN: 13 mg/dL (ref 6–23)
CO2: 30 meq/L (ref 19–32)
Calcium: 9.2 mg/dL (ref 8.4–10.5)
Chloride: 105 meq/L (ref 96–112)
Creatinine, Ser: 0.97 mg/dL (ref 0.40–1.50)
GFR: 81.51 mL/min (ref >60.00)
Glucose, Bld: 108 mg/dL — ABNORMAL HIGH (ref 70–99)
Potassium: 4.3 meq/L (ref 3.5–5.1)
Sodium: 142 meq/L (ref 135–145)

## 2023-03-11 LAB — HEPATIC FUNCTION PANEL
ALT: 27 U/L (ref 0–53)
AST: 18 U/L (ref 0–37)
Albumin: 4.7 g/dL (ref 3.5–5.2)
Alkaline Phosphatase: 47 U/L (ref 39–117)
Bilirubin, Direct: 0.1 mg/dL (ref 0.0–0.3)
Total Bilirubin: 0.5 mg/dL (ref 0.2–1.2)
Total Protein: 7.1 g/dL (ref 6.0–8.3)

## 2023-03-11 LAB — LIPID PANEL
Cholesterol: 202 mg/dL — ABNORMAL HIGH (ref 0–200)
HDL: 59.8 mg/dL (ref >39.00)
LDL Cholesterol: 113 mg/dL — ABNORMAL HIGH (ref 0–99)
NonHDL: 142.34
Total CHOL/HDL Ratio: 3
Triglycerides: 148 mg/dL (ref 0.0–149.0)
VLDL: 29.6 mg/dL (ref 0.0–40.0)

## 2023-03-11 LAB — TSH: TSH: 1.69 u[IU]/mL (ref 0.35–5.50)

## 2023-03-11 LAB — MICROALBUMIN / CREATININE URINE RATIO
Creatinine,U: 129.1 mg/dL
Microalb Creat Ratio: 0.5 mg/g (ref 0.0–30.0)
Microalb, Ur: <0.7 mg/dL (ref 0.0–1.9)

## 2023-03-11 LAB — HEMOGLOBIN A1C: Hgb A1c MFr Bld: 6.5 % (ref 4.6–6.5)

## 2023-03-11 LAB — PSA: PSA: 2.08 ng/mL (ref 0.10–4.00)

## 2023-03-11 LAB — VITAMIN D 25 HYDROXY (VIT D DEFICIENCY, FRACTURES): VITD: 51.55 ng/mL (ref 30.00–100.00)

## 2023-03-11 MED ORDER — NEXLIZET 180-10 MG PO TABS: ORAL_TABLET | ORAL | 3 refills | Status: DC

## 2023-03-11 MED ORDER — AMLODIPINE BESYLATE 5 MG PO TABS
5.0000 mg | ORAL_TABLET | Freq: Every day | ORAL | 3 refills | Status: DC
  Filled 2023-09-25: qty 90, 90d supply, fill #0
  Filled 2023-12-17 – 2023-12-30 (×2): qty 90, 90d supply, fill #1

## 2023-03-11 NOTE — Patient Instructions (Signed)
Ok to take the melatonin up to 10 mg at bedtime  Please take all new medication as prescribed - the amlodipine 10 mg per day for blood pressure, and nexlitzet for cholesterol if ok with insurance  Please continue all other medications as before, and refills have been done if requested.  Please have the pharmacy call with any other refills you may need.  Please continue your efforts at being more active, low cholesterol diet, and weight control.  You are otherwise up to date with prevention measures today.  Please keep your appointments with your specialists as you may have planned - podiatry for the cortisone to the feet  You will be contacted regarding the referral for: cardiology  Please go to the LAB at the blood drawing area for the tests to be done  You will be contacted by phone if any changes need to be made immediately.  Otherwise, you will receive a letter about your results with an explanation, but please check with MyChart first.  Please make an Appointment to return in 6 months, or sooner if needed

## 2023-03-11 NOTE — Progress Notes (Signed)
Patient ID: Kevin Farley, male   DOB: Jun 26, 1955, 68 y.o.   MRN: [id number]         Chief Complaint:: wellness exam and Foot Pain (And pressure has been going on for 3 to 4 months in both feet more in the heels )  , insomnia, CAD, htn, hld       HPI:  Kevin Farley is a 67 y.o. male here for wellness exam; for tdap and shingrix at pharmacy, o/w up to date                Also Pt denies chest pain, increased sob or doe, wheezing, orthopnea, PND, increased LE swelling, palpitations, dizziness or syncope.   Pt denies polydipsia, polyuria, or new focal neuro s/s.    Pt denies fever, wt loss, night sweats, loss of appetite, or other constitutional symptoms  Does continue to have bilateral heel plantar fasciitis, hesitant for cortisone shots.  Having persistent difficulty gettting to sleep.  BP has been mild elevated at home as well.  Pt willing to try to start nexlizet again though not able due to insurance last year.  Wife with him asking for Cardiology referral.   Wt Readings from Last 3 Encounters:  03/11/23 138 lb (62.6 kg)  03/05/23 140 lb 9.6 oz (63.8 kg)  12/18/22 140 lb (63.5 kg)   BP Readings from Last 3 Encounters:  03/11/23 (!) 148/88  03/05/23 (!) 150/90  12/18/22 136/78   Immunization History  Administered Date(s) Administered   Fluad Quad(high Dose 65+) 12/28/2021   Fluad Trivalent(High Dose 65+) 12/18/2022   Influenza Split 11/08/2011   Influenza,inj,Quad PF,6+ Mos 10/21/2012, 10/27/2013, 01/20/2015, 01/09/2016, 11/21/2016, 12/16/2017, 10/30/2018, 11/09/2020   Influenza-Unspecified 11/21/2016, 02/25/2020   PFIZER(Purple Top)SARS-COV-2 Vaccination 04/23/2019, 05/17/2019, 02/08/2020   PNEUMOCOCCAL CONJUGATE-20 06/28/2022   Tdap 10/19/2010   Health Maintenance Due  Topic Date Due   Zoster Vaccines- Shingrix (1 of 2) Never done   DTaP/Tdap/Td (2 - Td or Tdap) 10/18/2020      Past Medical History:  Diagnosis Date   Allergic rhinitis    Colon polyps    adenomatous 2011 & 2014    Family history of ischemic heart disease    GERD (gastroesophageal reflux disease)    HTN (hypertension) 04/16/2019   Hyperlipidemia    Other abnormal glucose    A1c 6% in 06/2009   Supraspinatus tendon tear    Past Surgical History:  Procedure Laterality Date   BACK SURGERY  02/12/2004   Dr Channing Mutters, [place]   cardic catherization  02/12/2003   negative; Dr Marni Griffon   colon polyps  2011 & 2014   Dr.Jacobs    COLONOSCOPY     CORNEAL TRANSPLANT Left 02/12/1991   Dr Dagoberto Ligas   nasal bone surgery  02/11/2005   UPPER GASTROINTESTINAL ENDOSCOPY  02/11/2009   mild gastritis; Dr Christella Hartigan    reports that he has never smoked. He has never used smokeless tobacco. He reports current alcohol use of about 7.0 standard drinks of alcohol per week. He reports that he does not use drugs. family history includes Diabetes in his brother, brother, father, mother, and sister; Heart attack in his brother and father; Hypertension in his brother, father, mother, and sister; Kidney failure in his brother and brother; Stroke in his mother; Transient ischemic attack in his brother. Allergies  Allergen Reactions   Aspirin     REACTION: hives States tolerates IBU & acetaminophen OK   Other     Walnuts & pecans cause  hives   Codeine     Itching with codeine containing cough syrup 9/15   Pecan Nut (Diagnostic) Hives   Zetia [Ezetimibe]     See 09/22/14 musculoskeletal pains   Ezetimibe-Simvastatin Hives    REACTION: myalgia   Current Outpatient Medications on File Prior to Visit  Medication Sig Dispense Refill   Blood Glucose Monitoring Suppl (ONE TOUCH ULTRA 2) w/Device KIT Use as directed once daily E11.9 1 kit 0   Coenzyme Q10 (CO Q-10) 100 MG CAPS Take 1 capsule by mouth daily.     dexlansoprazole (DEXILANT) 60 MG capsule TAKE 1 CAPSULE BY MOUTH EVERY DAY 90 capsule 3   Epinastine HCl 0.05 % ophthalmic solution SMARTSIG:1 Drop(s) In Eye(s) Twice Daily PRN     Flaxseed, Linseed, (FLAX SEED OIL PO) Take by  mouth.     fluticasone (FLONASE) 50 MCG/ACT nasal spray SPRAY 2 SPRAYS INTO EACH NOSTRIL DAILY 48 mL 3   Lancets MISC Use as directed once daily E11.9 100 each 3   losartan (COZAAR) 100 MG tablet Take 1 tablet (100 mg total) by mouth daily. 90 tablet 3   methylPREDNISolone (MEDROL DOSEPAK) 4 MG TBPK tablet 6 day dose pack - take as directed 21 tablet 0   Multiple Vitamin (MULTIVITAMIN) tablet Take 1 tablet by mouth daily.     Omega-3 Fatty Acids (FISH OIL) 500 MG CAPS Take 1 capsule by mouth daily.     ONETOUCH ULTRA test strip USE AS INSTRUCTED ONCE DAILY E11.9 100 strip 12   sildenafil (VIAGRA) 100 MG tablet Take 0.5-1 tablets (50-100 mg total) by mouth daily as needed for erectile dysfunction. 5 tablet 11   [DISCONTINUED] Lansoprazole (PREVACID PO) Take by mouth daily.       [DISCONTINUED] omeprazole (PRILOSEC) 40 MG capsule Take 1 capsule (40 mg total) by mouth daily. 180 capsule 1   No current facility-administered medications on file prior to visit.        ROS:  All others reviewed and negative.  Objective        PE:  BP (!) 148/88 (BP Location: Left Arm, Patient Position: Sitting, Cuff Size: Normal)   Pulse 75   Temp 98.1 F (36.7 C) (Oral)   Ht 5' 7.25" (1.708 m)   Wt 138 lb (62.6 kg)   SpO2 99%   BMI 21.45 kg/m                 Constitutional: Pt appears in NAD               HENT: Head: NCAT.                Right Ear: External ear normal.                 Left Ear: External ear normal.                Eyes: . Pupils are equal, round, and reactive to light. Conjunctivae and EOM are normal               Nose: without d/c or deformity               Neck: Neck supple. Gross normal ROM               Cardiovascular: Normal rate and regular rhythm.                 Pulmonary/Chest: Effort normal and breath sounds without rales or wheezing.  Abd:  Soft, NT, ND, + BS, no organomegaly               Neurological: Pt is alert. At baseline orientation, motor grossly  intact               Skin: Skin is warm. No rashes, no other new lesions, LE edema - none,  does have bilateral heel tenderness plantar               Psychiatric: Pt behavior is normal without agitation   Micro: none  Cardiac tracings I have personally interpreted today:  none  Pertinent Radiological findings (summarize): none   Lab Results  Component Value Date   WBC 5.6 03/11/2023   HGB 14.0 03/11/2023   HCT 42.9 03/11/2023   PLT 265.0 03/11/2023   GLUCOSE 108 (H) 03/11/2023   CHOL 202 (H) 03/11/2023   TRIG 148.0 03/11/2023   HDL 59.80 03/11/2023   LDLDIRECT 150.0 11/18/2019   LDLCALC 113 (H) 03/11/2023   ALT 27 03/11/2023   AST 18 03/11/2023   NA 142 03/11/2023   K 4.3 03/11/2023   CL 105 03/11/2023   CREATININE 0.97 03/11/2023   BUN 13 03/11/2023   CO2 30 03/11/2023   TSH 1.69 03/11/2023   PSA 2.08 03/11/2023   HGBA1C 6.5 03/11/2023   MICROALBUR <0.7 03/11/2023   Assessment/Plan:  Kevin Farley is a 68 y.o. Other or two or more races [6] male with  has a past medical history of Allergic rhinitis, Colon polyps, Family history of ischemic heart disease, GERD (gastroesophageal reflux disease), HTN (hypertension) (04/16/2019), Hyperlipidemia, Other abnormal glucose, and Supraspinatus tendon tear.  Encounter for well adult exam with abnormal findings Age and sex appropriate education and counseling updated with regular exercise and diet Referrals for preventative services - none needed Immunizations addressed - for tdap and shingrix at pharmacy Smoking counseling  - none needed Evidence for depression or other mood disorder - none significant Most recent labs reviewed. I have personally reviewed and have noted: 1) the patient's medical and social history 2) The patient's current medications and supplements 3) The patient's height, weight, and BMI have been recorded in the chart   Bilateral plantar fasciitis D/w pt  - with persistent pain the next step is to return to  ortho or sport med for cortisone shots  Elevated coronary artery calcium score Ok for cardiology referral per wife request  HTN (hypertension) BP Readings from Last 3 Encounters:  03/11/23 (!) 148/88  03/05/23 (!) 150/90  12/18/22 136/78   uncontrolled, pt to add norvasc 5 every day, cont losartan 100 qd   Hyperlipidemia Lab Results  Component Value Date   LDLCALC 113 (H) 03/11/2023   uncontrolled, pt to start nexlizet 1 qd   Insomnia Mild persistent, to add otc melatonin up to 10 mg qhs  Pre-diabetes Lab Results  Component Value Date   HGBA1C 6.5 03/11/2023   Stable, pt to continue current medical treatment  - diet, wt control   Vitamin D deficiency Last vitamin D Lab Results  Component Value Date   VD25OH 51.55 03/11/2023   Stable, cont oral replacement  Followup: Return in about 6 months (around 09/08/2023).  Oliver Barre, MD 03/15/2023 12:46 PM [facility] - [facility]

## 2023-03-11 NOTE — Progress Notes (Signed)
The test results show that your current treatment is OK, as the tests are stable.  Please continue the same plan.  There is no other need for change of treatment or further evaluation based on these results, at this time.  thanks

## 2023-03-15 ENCOUNTER — Encounter: Payer: Self-pay | Admitting: Internal Medicine

## 2023-03-15 NOTE — Assessment & Plan Note (Signed)
BP Readings from Last 3 Encounters:  03/11/23 (!) 148/88  03/05/23 (!) 150/90  12/18/22 136/78   uncontrolled, pt to add norvasc 5 every day, cont losartan 100 qd

## 2023-03-15 NOTE — Assessment & Plan Note (Signed)
Ok for cardiology referral per wife request

## 2023-03-15 NOTE — Assessment & Plan Note (Signed)
Mild persistent, to add otc melatonin up to 10 mg qhs

## 2023-03-15 NOTE — Assessment & Plan Note (Signed)
Lab Results  Component Value Date   LDLCALC 113 (H) 03/11/2023   uncontrolled, pt to start nexlizet 1 qd

## 2023-03-15 NOTE — Assessment & Plan Note (Signed)
Last vitamin D Lab Results  Component Value Date   VD25OH 51.55 03/11/2023   Stable, cont oral replacement

## 2023-03-15 NOTE — Assessment & Plan Note (Signed)
Lab Results  Component Value Date   HGBA1C 6.5 03/11/2023   Stable, pt to continue current medical treatment  - diet, wt control

## 2023-03-15 NOTE — Assessment & Plan Note (Signed)
D/w pt  - with persistent pain the next step is to return to ortho or sport med for cortisone shots

## 2023-03-15 NOTE — Assessment & Plan Note (Signed)

## 2023-04-10 ENCOUNTER — Other Ambulatory Visit: Payer: Self-pay | Admitting: Internal Medicine

## 2023-04-10 NOTE — Telephone Encounter (Unsigned)
 Copied from CRM [id number]. Topic: Clinical - Medication Refill >> Apr 10, 2023  1:38 PM Isabell A wrote: Most Recent Primary Care Visit:  Provider: Corwin Levins  Department: [facility] [place]  Visit Type: ACUTE  Date: 03/11/2023  Medication: rosuvastatin (CRESTOR) 40 MG tablet  Has the patient contacted their pharmacy? Yes (Agent: If no, request that the patient contact the pharmacy for the refill. If patient does not wish to contact the pharmacy document the reason why and proceed with request.) (Agent: If yes, when and what did the pharmacy advise?)  Is this the correct pharmacy for this prescription? Yes If no, delete pharmacy and type the correct one.  This is the patient's preferred pharmacy:  [id number] [place], [place] - [address] [place] [address] Phone: [phone] Fax: [phone]   Has the prescription been filled recently? Yes  Is the patient out of the medication? Yes  Has the patient been seen for an appointment in the last year OR does the patient have an upcoming appointment? Yes  Can we respond through MyChart? No  Agent: Please be advised that Rx refills may take up to 3 business days. We ask that you follow-up with your pharmacy.

## 2023-04-11 ENCOUNTER — Other Ambulatory Visit: Payer: Self-pay

## 2023-04-11 MED ORDER — ROSUVASTATIN CALCIUM 40 MG PO TABS: 40.0000 mg | ORAL_TABLET | Freq: Every day | ORAL | 3 refills | Status: DC

## 2023-05-06 ENCOUNTER — Telehealth: Payer: Self-pay | Admitting: Internal Medicine

## 2023-05-06 DIAGNOSIS — R7303 Prediabetes: Secondary | ICD-10-CM

## 2023-05-06 DIAGNOSIS — E782 Mixed hyperlipidemia: Secondary | ICD-10-CM

## 2023-05-06 DIAGNOSIS — R972 Elevated prostate specific antigen [PSA]: Secondary | ICD-10-CM

## 2023-05-06 NOTE — Telephone Encounter (Signed)
 Ok labs are ordered

## 2023-05-06 NOTE — Telephone Encounter (Signed)
 Pt has scheduled 6 month f/u 06/17/2023 and lab visit 06/09/2023.  Please enter orders for lab visit.

## 2023-05-13 IMAGING — CT CT CHEST W/O CM
1 of 2 series · 15 of 32 positions shown, 19 images · non-contrast
Comparison: Chest CT dated 08/07/2010.

CLINICAL DATA: Pulmonary nodule follow-up.



[Series 6: super d · axial · 0.63mm/px · z∈[+897,+1173]mm · 15 of 387 slices shown, 19 images]
[im 21/387  mediastinal]
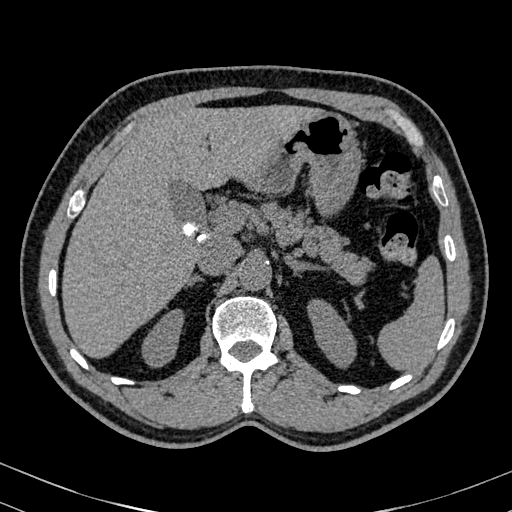
[im 21/387  lung]
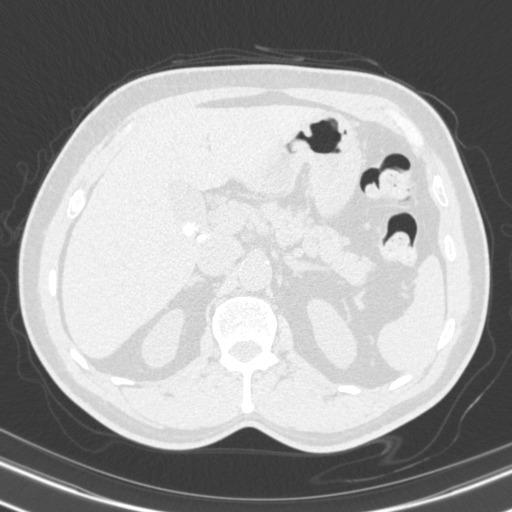
[im 61/387  lung]
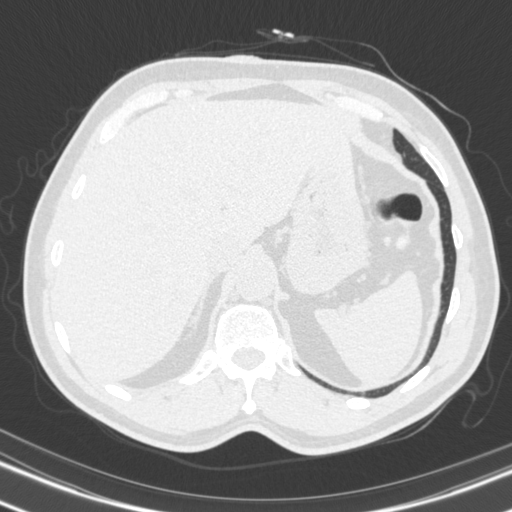
[im 82/387  lung]
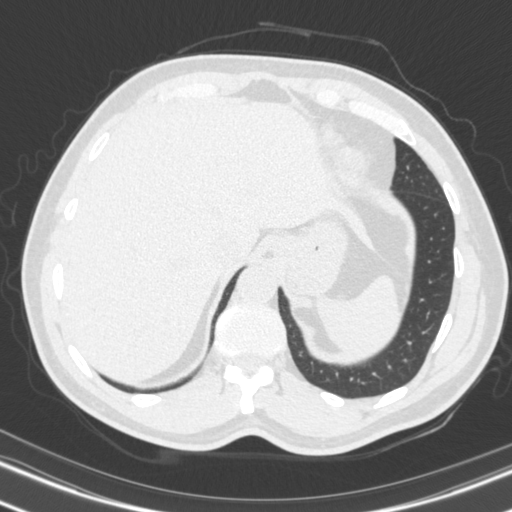
[im 102/387  lung]
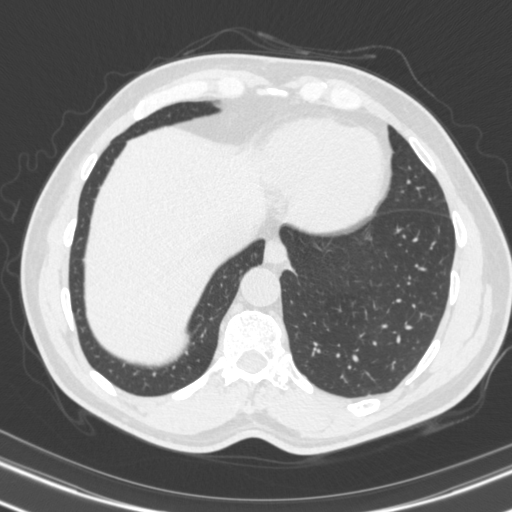
[im 129/387  mediastinal]
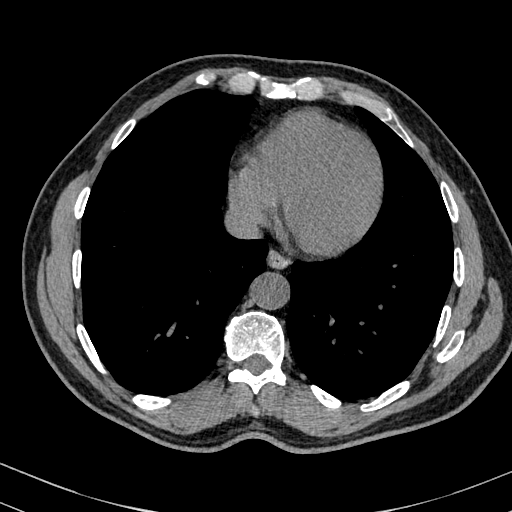
[im 129/387  lung]
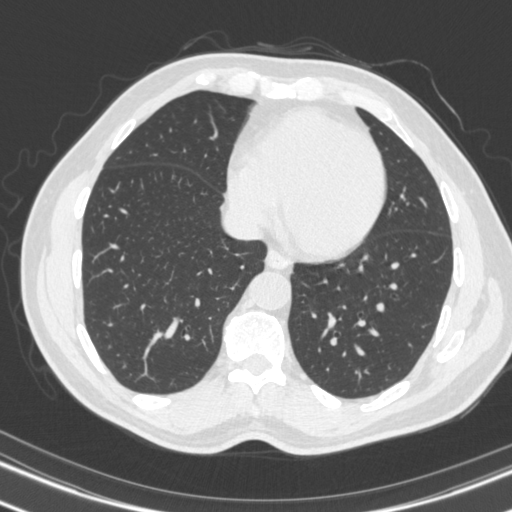
[im 143/387  lung]
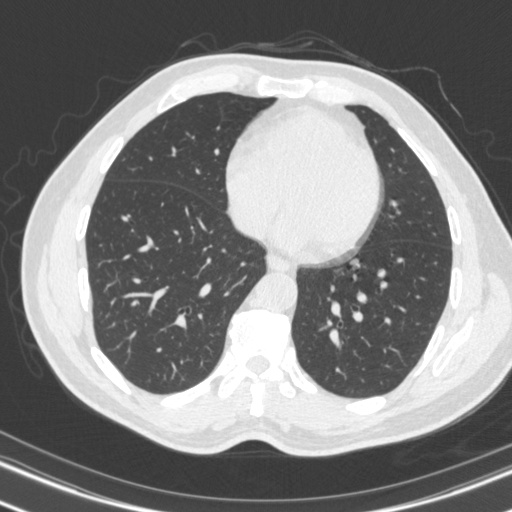
[im 182/387  lung]
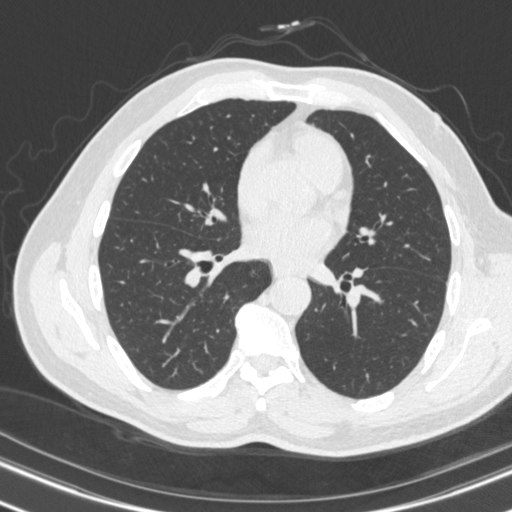
[im 183/387  lung]
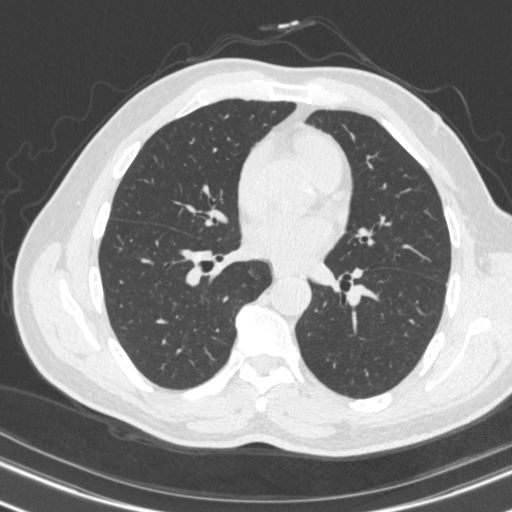
[im 204/387  mediastinal]
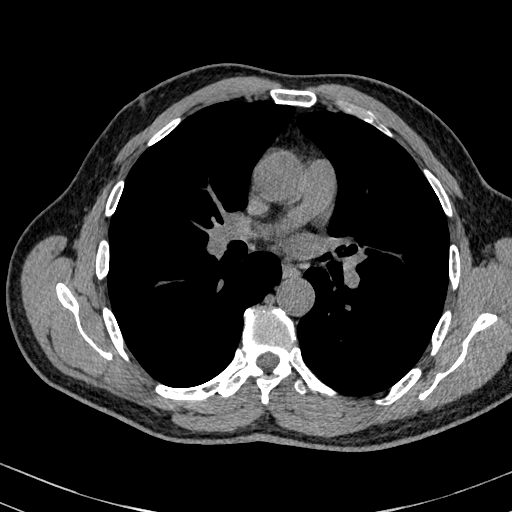
[im 204/387  lung]
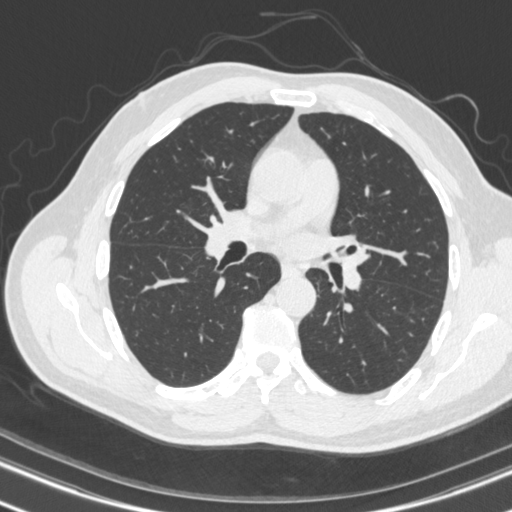
[im 244/387  lung]
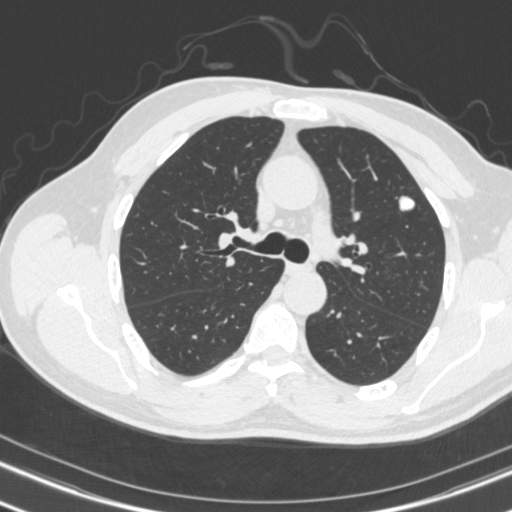
[im 258/387  lung]
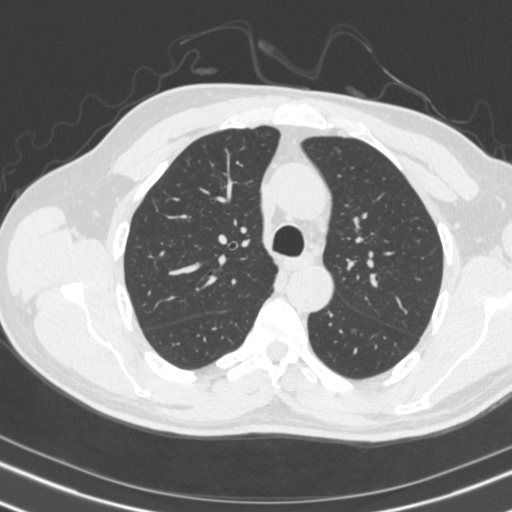
[im 285/387  lung]
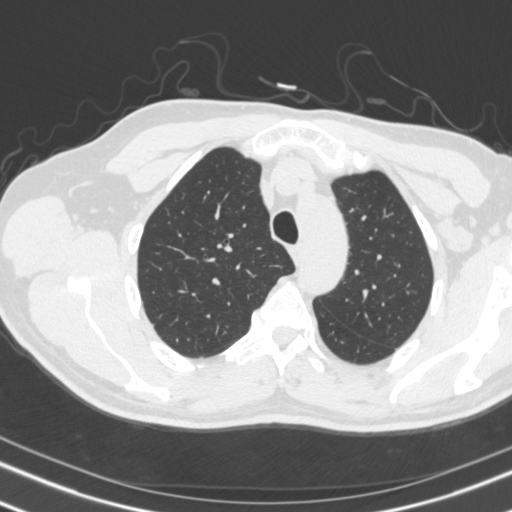
[im 305/387  mediastinal]
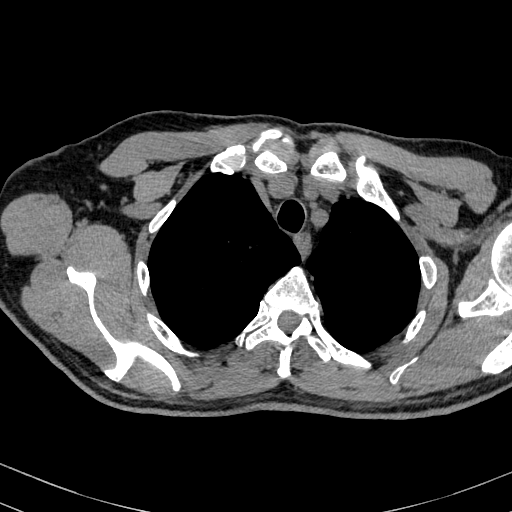
[im 305/387  lung]
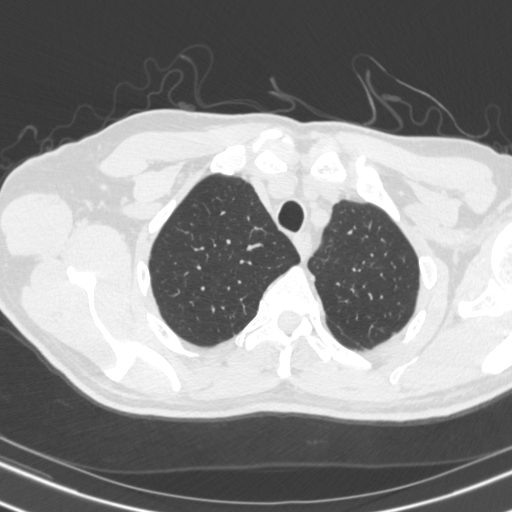
[im 326/387  lung]
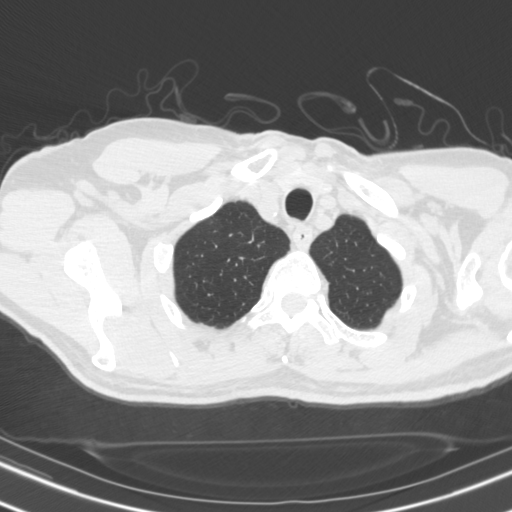
[im 366/387  lung]
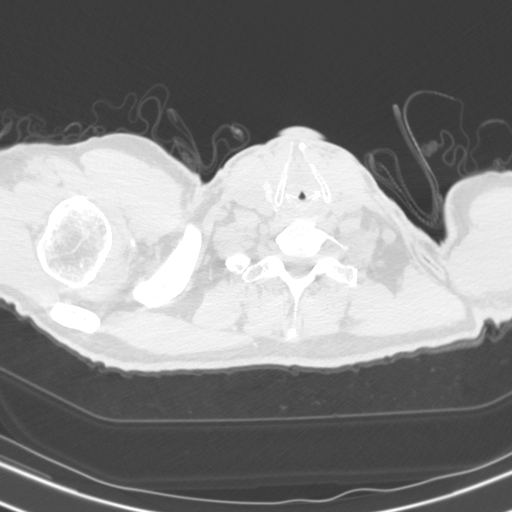

[15 of 32 positions shown; findings below may reference images not displayed]

FINDINGS: Evaluation of this exam is limited in the absence of intravenous
contrast.

Cardiovascular: There is no cardiomegaly or pericardial effusion.
There is coronary vascular calcification of the LAD. Mild
atherosclerotic calcification of the thoracic aorta. No aneurysm or
dilatation. The central pulmonary arteries are grossly unremarkable.

Mediastinum/Nodes: No hilar or mediastinal adenopathy. The esophagus
and the thyroid gland are grossly unremarkable. No mediastinal fluid
collection.

Lungs/Pleura: There is a 1 cm calcified granuloma in the left upper
lobe. Faint area of haziness in the right middle lobe (70/5), likely
post inflammatory in etiology. No consolidative changes. There is no
pleural effusion pneumothorax. The central airways are patent.

Upper Abdomen: Gallstones.

Musculoskeletal: No chest wall mass or suspicious bone lesions
identified.
IMPRESSION: 1. A 1 cm calcified granuloma in the left upper lobe. Faint area of
haziness in the right middle lobe, likely post inflammatory in
etiology.
2. Coronary vascular calcification of the LAD.
3. Cholelithiasis.
4. Aortic Atherosclerosis ([id number]).

## 2023-05-30 ENCOUNTER — Telehealth: Payer: Self-pay | Admitting: Internal Medicine

## 2023-05-30 NOTE — Telephone Encounter (Signed)
 Copied from CRM [id number]. Topic: Clinical - Medication Question >> May 30, 2023 11:54 AM Jenice Mitts wrote: Reason for CRM: Patient is calling in because he was told to quit taking his blood sugar medication. His blood sugar levels have been high so he would like to know if he should start taking it again

## 2023-06-02 NOTE — Telephone Encounter (Signed)
 If possible, let us  know how high the sugars have been over the past wk, and if his diet or activity has changed.   If he is not sure of his sugar readings, and if possible, please to check CBGs at home 3-4 times per day for 3 days and let us  know the results.   thanks

## 2023-06-09 ENCOUNTER — Other Ambulatory Visit (INDEPENDENT_AMBULATORY_CARE_PROVIDER_SITE_OTHER): Payer: Medicare Other

## 2023-06-09 DIAGNOSIS — E782 Mixed hyperlipidemia: Secondary | ICD-10-CM

## 2023-06-09 DIAGNOSIS — R7303 Prediabetes: Secondary | ICD-10-CM

## 2023-06-09 DIAGNOSIS — R972 Elevated prostate specific antigen [PSA]: Secondary | ICD-10-CM | POA: Diagnosis not present

## 2023-06-09 LAB — BASIC METABOLIC PANEL WITH GFR
BUN: 18 mg/dL (ref 6–23)
CO2: 28 meq/L (ref 19–32)
Calcium: 9.2 mg/dL (ref 8.4–10.5)
Chloride: 104 meq/L (ref 96–112)
Creatinine, Ser: 1.04 mg/dL (ref 0.40–1.50)
GFR: 74.84 mL/min (ref >60.00)
Glucose, Bld: 106 mg/dL — ABNORMAL HIGH (ref 70–99)
Potassium: 4.3 meq/L (ref 3.5–5.1)
Sodium: 142 meq/L (ref 135–145)

## 2023-06-09 LAB — HEPATIC FUNCTION PANEL
ALT: 27 U/L (ref 0–53)
AST: 17 U/L (ref 0–37)
Albumin: 4.5 g/dL (ref 3.5–5.2)
Alkaline Phosphatase: 42 U/L (ref 39–117)
Bilirubin, Direct: 0.1 mg/dL (ref 0.0–0.3)
Total Bilirubin: 0.6 mg/dL (ref 0.2–1.2)
Total Protein: 7.1 g/dL (ref 6.0–8.3)

## 2023-06-09 LAB — LIPID PANEL
Cholesterol: 219 mg/dL — ABNORMAL HIGH (ref 0–200)
HDL: 61.3 mg/dL (ref >39.00)
LDL Cholesterol: 114 mg/dL — ABNORMAL HIGH (ref 0–99)
NonHDL: 157.41
Total CHOL/HDL Ratio: 4
Triglycerides: 218 mg/dL — ABNORMAL HIGH (ref 0.0–149.0)
VLDL: 43.6 mg/dL — ABNORMAL HIGH (ref 0.0–40.0)

## 2023-06-09 LAB — PSA: PSA: 1.57 ng/mL (ref 0.10–4.00)

## 2023-06-09 LAB — HEMOGLOBIN A1C: Hgb A1c MFr Bld: 6.3 % (ref 4.6–6.5)

## 2023-06-17 ENCOUNTER — Ambulatory Visit (INDEPENDENT_AMBULATORY_CARE_PROVIDER_SITE_OTHER): Payer: Medicare Other | Admitting: Internal Medicine

## 2023-06-17 ENCOUNTER — Encounter: Payer: Self-pay | Admitting: Internal Medicine

## 2023-06-17 VITALS — BP 136/80 | HR 80 | Temp 98.1°F | Ht 67.25 in | Wt 143.0 lb

## 2023-06-17 DIAGNOSIS — E782 Mixed hyperlipidemia: Secondary | ICD-10-CM | POA: Diagnosis not present

## 2023-06-17 DIAGNOSIS — R202 Paresthesia of skin: Secondary | ICD-10-CM | POA: Insufficient documentation

## 2023-06-17 DIAGNOSIS — E538 Deficiency of other specified B group vitamins: Secondary | ICD-10-CM

## 2023-06-17 DIAGNOSIS — R7303 Prediabetes: Secondary | ICD-10-CM

## 2023-06-17 DIAGNOSIS — M722 Plantar fascial fibromatosis: Secondary | ICD-10-CM | POA: Diagnosis not present

## 2023-06-17 DIAGNOSIS — E559 Vitamin D deficiency, unspecified: Secondary | ICD-10-CM | POA: Diagnosis not present

## 2023-06-17 MED ORDER — ROSUVASTATIN CALCIUM 40 MG PO TABS
40.0000 mg | ORAL_TABLET | ORAL | 3 refills | Status: AC
  Filled 2023-10-09 – 2023-10-28 (×2): qty 45, 90d supply, fill #0

## 2023-06-17 MED ORDER — EZETIMIBE 10 MG PO TABS: 10.0000 mg | ORAL_TABLET | Freq: Every day | ORAL | 3 refills | Status: DC

## 2023-06-17 MED ORDER — REPATHA SURECLICK 140 MG/ML ~~LOC~~ SOAJ: 140.0000 mg | SUBCUTANEOUS | 3 refills | Status: DC

## 2023-06-17 MED ORDER — SILDENAFIL CITRATE 100 MG PO TABS: 50.0000 mg | ORAL_TABLET | Freq: Every day | ORAL | 11 refills | Status: DC | PRN

## 2023-06-17 MED ORDER — AMITRIPTYLINE HCL 100 MG PO TABS
ORAL_TABLET | ORAL | 1 refills | Status: DC
Start: 2023-06-17 — End: 2023-08-14

## 2023-06-17 NOTE — Assessment & Plan Note (Signed)
 Lab Results  Component Value Date   HGBA1C 6.3 06/09/2023   Stable, pt to continue current medical treatment  - diet, wt control

## 2023-06-17 NOTE — Assessment & Plan Note (Signed)
 Some improved, f/u podiatry as planned

## 2023-06-17 NOTE — Telephone Encounter (Signed)
 Pt seen in office today.

## 2023-06-17 NOTE — Assessment & Plan Note (Signed)
 With possible early neuropathy, for elavil 50 - 100 at bedtime prn

## 2023-06-17 NOTE — Patient Instructions (Signed)
 Ok to stop the Nexlizet  as it does not seem to be helping  Please take all new medication as prescribed - the repatha  140 mg shot every 2 wks  Please continue all other medications as before, and refills are done for the zetia  10 mg and crestor  40 mg every other day  Please take all new medication as prescribed- the elavil for the nerve pain in the feet  Please have the pharmacy call with any other refills you may need.  Please continue your efforts at being more active, low cholesterol diet, and weight control..  Please keep your appointments with your specialists as you may have planned - podiatry for the tendonitis in the feet  Please make an Appointment to return in 6 months, or sooner if needed, also with Lab Appointment for testing done 3-5 days before at the FIRST FLOOR Lab (so this is for TWO appointments - please see the scheduling desk as you leave)

## 2023-06-17 NOTE — Assessment & Plan Note (Signed)
 Lab Results  Component Value Date   LDLCALC 114 (H) 06/09/2023   Uncontrolled, apparently nexlitol now effective and ok to stop,, pt to continue current statin crestor  40 every other day, restart zetia  10 every day and add repatha  140 mg biweekly

## 2023-06-17 NOTE — Progress Notes (Signed)
 Patient ID: Kevin Farley, male   DOB: [birth year], 68 y.o.   MRN: [id number]        Chief Complaint: follow up HTN, HLD, neuropathy, plantar fasciitis       HPI:  Kevin Farley is a 68 y.o. 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  Has been taking nexlitol since jan 2025, and had recent lipid checked Pt denies chest pain, increased sob or doe, wheezing, orthopnea, PND, increased LE swelling, palpitations, dizziness or syncope.   Pt denies polydipsia, polyuria,  Has been able to take the crestor  40 mg every other day with co-q10.         Wt Readings from Last 3 Encounters:  06/17/23 143 lb (64.9 kg)  03/11/23 138 lb (62.6 kg)  03/05/23 140 lb 9.6 oz (63.8 kg)   BP Readings from Last 3 Encounters:  06/17/23 136/80  03/11/23 (!) 148/88  03/05/23 (!) 150/90         Past Medical History:  Diagnosis Date   Allergic rhinitis    Colon polyps    adenomatous 2011 & 2014   Family history of ischemic heart disease    GERD (gastroesophageal reflux disease)    HTN (hypertension) 04/16/2019   Hyperlipidemia    Other abnormal glucose    A1c 6% in 06/2009   Supraspinatus tendon tear    Past Surgical History:  Procedure Laterality Date   BACK SURGERY  02/12/2004   Dr Joanette Moynahan, [place]   cardic catherization  02/12/2003   negative; Dr Lynnell Sato   colon polyps  2011 & 2014   Dr.Jacobs    COLONOSCOPY     CORNEAL TRANSPLANT Left 02/12/1991   Dr Matthew Songster   nasal bone surgery  02/11/2005   UPPER GASTROINTESTINAL ENDOSCOPY  02/11/2009   mild gastritis; Dr Howard Macho    reports that he has never smoked. He has never used smokeless tobacco. He reports current alcohol use of about 7.0 standard drinks of alcohol per week. He reports that he does not use drugs. family history includes Diabetes in his brother,  brother, father, mother, and sister; Heart attack in his brother and father; Hypertension in his brother, father, mother, and sister; Kidney failure in his brother and brother; Stroke in his mother; Transient ischemic attack in his brother. Allergies  Allergen Reactions   Aspirin     REACTION: hives States tolerates IBU & acetaminophen  OK   Other     Walnuts & pecans cause hives   Codeine     Itching with codeine containing cough syrup 9/15   Pecan Nut (Diagnostic) Hives   Zetia  [Ezetimibe ]     See 09/22/14 musculoskeletal pains   Ezetimibe -Simvastatin Hives    REACTION: myalgia   Current Outpatient Medications on File Prior to Visit  Medication Sig Dispense Refill   amLODipine  (NORVASC ) 5 MG tablet Take 1 tablet (5 mg total) by mouth daily. 90 tablet 3   Blood Glucose Monitoring Suppl (ONE TOUCH ULTRA 2) w/Device KIT Use as directed once daily E11.9 1 kit 0   Coenzyme Q10 (CO Q-10) 100 MG CAPS Take 1 capsule by mouth daily.     dexlansoprazole  (DEXILANT ) 60 MG capsule TAKE 1 CAPSULE BY MOUTH EVERY DAY 90 capsule 3   Epinastine HCl 0.05 % ophthalmic solution SMARTSIG:1 Drop(s)  In Eye(s) Twice Daily PRN     Flaxseed, Linseed, (FLAX SEED OIL PO) Take by mouth.     fluticasone  (FLONASE ) 50 MCG/ACT nasal spray SPRAY 2 SPRAYS INTO EACH NOSTRIL DAILY 48 mL 3   Lancets MISC Use as directed once daily E11.9 100 each 3   losartan  (COZAAR ) 100 MG tablet Take 1 tablet (100 mg total) by mouth daily. 90 tablet 3   Multiple Vitamin (MULTIVITAMIN) tablet Take 1 tablet by mouth daily.     Omega-3 Fatty Acids (FISH OIL) 500 MG CAPS Take 1 capsule by mouth daily.     ONETOUCH ULTRA test strip USE AS INSTRUCTED ONCE DAILY E11.9 100 strip 12   [DISCONTINUED] Lansoprazole (PREVACID PO) Take by mouth daily.       [DISCONTINUED] omeprazole  (PRILOSEC) 40 MG capsule Take 1 capsule (40 mg total) by mouth daily. 180 capsule 1   No current facility-administered medications on file prior to visit.        ROS:   All others reviewed and negative.  Objective        PE:  BP 136/80 (BP Location: Left Arm, Patient Position: Sitting, Cuff Size: Normal)   Pulse 80   Temp 98.1 F (36.7 C) (Oral)   Ht 5' 7.25" (1.708 m)   Wt 143 lb (64.9 kg)   SpO2 99%   BMI 22.23 kg/m                 Constitutional: Pt appears in NAD               HENT: Head: NCAT.                Right Ear: External ear normal.                 Left Ear: External ear normal.                Eyes: . Pupils are equal, round, and reactive to light. Conjunctivae and EOM are normal               Nose: without d/c or deformity               Neck: Neck supple. Gross normal ROM               Cardiovascular: Normal rate and regular rhythm.                 Pulmonary/Chest: Effort normal and breath sounds without rales or wheezing.                Abd:  Soft, NT, ND, + BS, no organomegaly               Neurological: Pt is alert. At baseline orientation, motor grossly intact               Skin: Skin is warm. No rashes, no other new lesions, LE edema - none               Psychiatric: Pt behavior is normal without agitation   Micro: none  Cardiac tracings I have personally interpreted today:  none  Pertinent Radiological findings (summarize): none   Lab Results  Component Value Date   WBC 5.6 03/11/2023   HGB 14.0 03/11/2023   HCT 42.9 03/11/2023   PLT 265.0 03/11/2023   GLUCOSE 106 (H) 06/09/2023   CHOL 219 (H) 06/09/2023   TRIG 218.0 (H) 06/09/2023   HDL 61.30 06/09/2023   LDLDIRECT  150.0 11/18/2019   LDLCALC 114 (H) 06/09/2023   ALT 27 06/09/2023   AST 17 06/09/2023   NA 142 06/09/2023   K 4.3 06/09/2023   CL 104 06/09/2023   CREATININE 1.04 06/09/2023   BUN 18 06/09/2023   CO2 28 06/09/2023   TSH 1.69 03/11/2023   PSA 1.57 06/09/2023   HGBA1C 6.3 06/09/2023   MICROALBUR <0.7 03/11/2023   Assessment/Plan:  Kevin Farley is a 68 y.o. Other or two or more races [6] male with  has a past medical history of Allergic rhinitis,  Colon polyps, Family history of ischemic heart disease, GERD (gastroesophageal reflux disease), HTN (hypertension) (04/16/2019), Hyperlipidemia, Other abnormal glucose, and Supraspinatus tendon tear.  Hyperlipidemia Lab Results  Component Value Date   LDLCALC 114 (H) 06/09/2023   Uncontrolled, apparently nexlitol now effective and ok to stop,, pt to continue current statin crestor  40 every other day, restart zetia  10 every day and add repatha  140 mg biweekly   Bilateral plantar fasciitis Some improved, f/u podiatry as planned  Paresthesia of both feet With possible early neuropathy, for elavil 50 - 100 at bedtime prn  Pre-diabetes Lab Results  Component Value Date   HGBA1C 6.3 06/09/2023   Stable, pt to continue current medical treatment  - diet, wt control  Followup: Return in about 6 months (around 12/18/2023).  Rosalia Colonel, MD 06/17/2023 1:14 PM [facility] [place] [facility] - [facility]

## 2023-06-18 ENCOUNTER — Other Ambulatory Visit (HOSPITAL_COMMUNITY): Payer: Self-pay

## 2023-06-18 ENCOUNTER — Telehealth: Payer: Self-pay | Admitting: Pharmacy Technician

## 2023-06-18 NOTE — Telephone Encounter (Signed)
 Pharmacy Patient Advocate Encounter  Received notification from [facility] that Prior Authorization for Repatha  has been APPROVED from 06/18/23 to 02/11/24. Ran test claim, Copay is $141.00- 3 months. This test claim was processed through [facility]- copay amounts may vary at other pharmacies due to pharmacy/plan contracts, or as the patient moves through the different stages of their insurance plan.   PA #/Case ID/Reference #: [id number]

## 2023-06-18 NOTE — Telephone Encounter (Signed)
 Pharmacy Patient Advocate Encounter   Received notification from CoverMyMeds that prior authorization for Repatha  is required/requested.   Insurance verification completed.   The patient is insured through [place] .   Per test claim: PA required; PA submitted to above mentioned insurance via CoverMyMeds Key/confirmation #/EOC [id number] Status is pending

## 2023-06-23 ENCOUNTER — Other Ambulatory Visit (HOSPITAL_COMMUNITY): Payer: Self-pay

## 2023-06-23 ENCOUNTER — Telehealth: Payer: Self-pay

## 2023-06-23 NOTE — Telephone Encounter (Signed)
 Pharmacy Patient Advocate Encounter   Received notification from Patient Pharmacy that prior authorization for Nexlizet  180-10 tabs is required/requested.   Insurance verification completed.   The patient is insured through [facility] .   Per test claim: The current 90 day co-pay is, $141.00.  No PA needed at this time. This test claim was processed through [facility]- copay amounts may vary at other pharmacies due to pharmacy/plan contracts, or as the patient moves through the different stages of their insurance plan.

## 2023-07-14 ENCOUNTER — Telehealth: Payer: Self-pay | Admitting: Internal Medicine

## 2023-07-14 DIAGNOSIS — R202 Paresthesia of skin: Secondary | ICD-10-CM

## 2023-07-14 DIAGNOSIS — M79601 Pain in right arm: Secondary | ICD-10-CM

## 2023-07-14 NOTE — Telephone Encounter (Signed)
 Ok this is done

## 2023-07-14 NOTE — Telephone Encounter (Signed)
 Copied from CRM [id number]. Topic: Referral - Request for Referral >> Jul 14, 2023 12:43 PM Kita Perish H wrote: Did the patient discuss referral with their provider in the last year? Yes (If No - schedule appointment) (If Yes - send message)  Appointment offered? No  Type of order/referral and detailed reason for visit: Neurology for numbness in feet and arms  Preference of office, provider, location: N/A  If referral order, have you been seen by this specialty before? No (If Yes, this issue or another issue? When? Where?  Can we respond through MyChart? Yes

## 2023-07-24 ENCOUNTER — Encounter: Payer: Self-pay | Admitting: Neurology

## 2023-08-07 ENCOUNTER — Telehealth: Payer: Self-pay | Admitting: Internal Medicine

## 2023-08-07 DIAGNOSIS — Z201 Contact with and (suspected) exposure to tuberculosis: Secondary | ICD-10-CM

## 2023-08-07 DIAGNOSIS — R7303 Prediabetes: Secondary | ICD-10-CM

## 2023-08-07 NOTE — Telephone Encounter (Signed)
 Copied from CRM [id number]. Topic: Clinical - Request for Lab/Test Order >> Aug 07, 2023  1:43 PM Berneda FALCON wrote: Reason for CRM: Pt's wife Kevin Farley would like Kevin Farley to be tested for blood test TB states he was exposed a couple of months ago and was recommended to get his A1C checked as well.

## 2023-08-07 NOTE — Telephone Encounter (Signed)
 Ok the lab test for TB has been ordered  Also the A1c and related tests   thanks

## 2023-08-08 NOTE — Telephone Encounter (Signed)
 Called and let Pt know

## 2023-08-11 ENCOUNTER — Other Ambulatory Visit (INDEPENDENT_AMBULATORY_CARE_PROVIDER_SITE_OTHER)

## 2023-08-11 ENCOUNTER — Ambulatory Visit: Payer: Self-pay | Admitting: Internal Medicine

## 2023-08-11 DIAGNOSIS — R7303 Prediabetes: Secondary | ICD-10-CM

## 2023-08-11 DIAGNOSIS — Z201 Contact with and (suspected) exposure to tuberculosis: Secondary | ICD-10-CM

## 2023-08-11 LAB — LIPID PANEL
Cholesterol: 197 mg/dL (ref 0–200)
HDL: 63.8 mg/dL (ref >39.00)
LDL Cholesterol: 109 mg/dL — ABNORMAL HIGH (ref 0–99)
NonHDL: 132.73
Total CHOL/HDL Ratio: 3
Triglycerides: 121 mg/dL (ref 0.0–149.0)
VLDL: 24.2 mg/dL (ref 0.0–40.0)

## 2023-08-11 LAB — HEPATIC FUNCTION PANEL
ALT: 38 U/L (ref 0–53)
AST: 27 U/L (ref 0–37)
Albumin: 4.4 g/dL (ref 3.5–5.2)
Alkaline Phosphatase: 40 U/L (ref 39–117)
Bilirubin, Direct: 0.1 mg/dL (ref 0.0–0.3)
Total Bilirubin: 0.5 mg/dL (ref 0.2–1.2)
Total Protein: 7.1 g/dL (ref 6.0–8.3)

## 2023-08-11 LAB — HEMOGLOBIN A1C: Hgb A1c MFr Bld: 6.2 % (ref 4.6–6.5)

## 2023-08-11 LAB — BASIC METABOLIC PANEL WITH GFR
BUN: 16 mg/dL (ref 6–23)
CO2: 26 meq/L (ref 19–32)
Calcium: 9.3 mg/dL (ref 8.4–10.5)
Chloride: 106 meq/L (ref 96–112)
Creatinine, Ser: 0.91 mg/dL (ref 0.40–1.50)
GFR: 87.75 mL/min (ref >60.00)
Glucose, Bld: 128 mg/dL — ABNORMAL HIGH (ref 70–99)
Potassium: 4.3 meq/L (ref 3.5–5.1)
Sodium: 141 meq/L (ref 135–145)

## 2023-08-13 LAB — QUANTIFERON-TB GOLD PLUS
Mitogen-NIL: >10 [IU]/mL
NIL: 0.02 [IU]/mL
QuantiFERON-TB Gold Plus: NEGATIVE
TB1-NIL: 0.01 [IU]/mL
TB2-NIL: 0.01 [IU]/mL

## 2023-08-13 NOTE — Progress Notes (Signed)
 Initial neurology clinic note  Reason for Evaluation: Consultation requested by Norleen Lynwood ORN, MD for an opinion regarding tingling in feet. My final recommendations will be communicated back to the requesting physician by way of shared medical record or letter to requesting physician via [place]  mail.  HPI: This is [name], a 68 y.o. right-handed male with a medical history of lumbar radiculopathy s/p left hemilaminectomy at L4-5, HTN, HLD, plantar fasciitis, pre-DM, GERD who presents to neurology clinic with the chief complaint of tingling and burning in feet and hands. The patient is accompanied by tingling and burning in feet.  Patient has had symptoms for over a year. He first noticed symptoms in his feet. It is a burning and tingling sensation in mostly in the bottom of the feet. It is the same on both sides. He will also occasionally have symptoms in the hands, but rarer and less bothersome. He has occasional neck and back pain. His symptoms are worse at night and in the morning. He denies weakness. He denies imbalance or falls.  He has seen podiatry. There was concern for plantar fasciitis. He was given inserts that did not help. He was also given amitriptyline  100 mg (no titration) but only took it once and it really knocked him out so he did not continue it.  The patient has not had similar episodes of symptoms in the past.   He has a history of lumbar spine surgery, but these symptoms are different. Patient had much more back pain and could not walk at that time.   The patient does not report symptoms referable to autonomic dysfunction including impaired sweating, heat or cold intolerance, excessive mucosal dryness, gastroparetic early satiety, postprandial abdominal bloating, constipation, bowel or bladder dyscontrol, or syncope/presyncope/orthostatic intolerance.  He does not report any constitutional symptoms like fever, night sweats, anorexia or unintentional weight  loss.  EtOH use: 1-2 glasses of red wine per night Restrictive diet? No Family history of neuropathy/myopathy/NM disease/neurologic disease? No  MEDICATIONS:  Outpatient Encounter Medications as of 08/27/2023  Medication Sig Note   amLODipine  (NORVASC ) 5 MG tablet Take 1 tablet (5 mg total) by mouth daily.    Blood Glucose Monitoring Suppl (ONE TOUCH ULTRA 2) w/Device KIT Use as directed once daily E11.9    clopidogrel  (PLAVIX ) 75 MG tablet Take 1 tablet (75 mg total) by mouth daily.    Coenzyme Q10 (CO Q-10) 100 MG CAPS Take 1 capsule by mouth daily.    empagliflozin  (JARDIANCE ) 10 MG TABS tablet Take 1 tablet (10 mg total) by mouth daily before breakfast.    Epinastine HCl 0.05 % ophthalmic solution SMARTSIG:1 Drop(s) In Eye(s) Twice Daily PRN    ezetimibe  (ZETIA ) 10 MG tablet Take 1 tablet (10 mg total) by mouth daily.    fluticasone  (FLONASE ) 50 MCG/ACT nasal spray SPRAY 2 SPRAYS INTO EACH NOSTRIL DAILY    Lancets MISC Use as directed once daily E11.9    losartan  (COZAAR ) 100 MG tablet Take 1 tablet (100 mg total) by mouth daily.    Multiple Vitamin (MULTIVITAMIN) tablet Take 1 tablet by mouth daily.    ONETOUCH ULTRA test strip USE AS INSTRUCTED ONCE DAILY E11.9    rosuvastatin  (CRESTOR ) 40 MG tablet Take 1 tablet (40 mg total) by mouth every other day.    [DISCONTINUED] amitriptyline  (ELAVIL ) 100 MG tablet 1/2 - 1 tab by mouth once at bedtime as needed for pain and sleep    [DISCONTINUED] dexlansoprazole  (DEXILANT ) 60 MG capsule TAKE  1 CAPSULE BY MOUTH EVERY DAY    [DISCONTINUED] Evolocumab  (REPATHA  SURECLICK) 140 MG/ML SOAJ Inject 140 mg into the skin every 14 (fourteen) days.    [DISCONTINUED] Flaxseed, Linseed, (FLAX SEED OIL PO) Take by mouth.    [DISCONTINUED] Lansoprazole (PREVACID PO) Take by mouth daily.   12/12/2010: PER PATIENT CHANGED THERAPY-PLEASE REMOVE.   [DISCONTINUED] Omega-3 Fatty Acids (FISH OIL) 500 MG CAPS Take 1 capsule by mouth daily.    [DISCONTINUED]  omeprazole  (PRILOSEC) 40 MG capsule Take 1 capsule (40 mg total) by mouth daily.    [DISCONTINUED] sildenafil  (VIAGRA ) 100 MG tablet Take 0.5-1 tablets (50-100 mg total) by mouth daily as needed for erectile dysfunction.    No facility-administered encounter medications on file as of 08/27/2023.    PAST MEDICAL HISTORY: Past Medical History:  Diagnosis Date   Allergic rhinitis    Colon polyps    adenomatous 2011 & 2014   Family history of ischemic heart disease    GERD (gastroesophageal reflux disease)    HTN (hypertension) 04/16/2019   Hyperlipidemia    Other abnormal glucose    A1c 6% in 06/2009   Supraspinatus tendon tear     PAST SURGICAL HISTORY: Past Surgical History:  Procedure Laterality Date   BACK SURGERY  02/12/2004   Dr [place], [place]   cardic catherization  02/12/2003   negative; Dr Lorn   colon polyps  2011 & 2014   Dr.Jacobs    COLONOSCOPY     CORNEAL TRANSPLANT Left 02/12/1991   Dr Rosan   nasal bone surgery  02/11/2005   UPPER GASTROINTESTINAL ENDOSCOPY  02/11/2009   mild gastritis; Dr Teressa    ALLERGIES: Allergies  Allergen Reactions   Aspirin     REACTION: hives States tolerates IBU & acetaminophen  OK   Other     Walnuts & pecans cause hives   Codeine     Itching with codeine containing cough syrup 9/15   Pecan Nut (Diagnostic) Hives   Zetia  [Ezetimibe ]     See 09/22/14 musculoskeletal pains   Ezetimibe -Simvastatin Hives    REACTION: myalgia    FAMILY HISTORY: Family History  Problem Relation Age of Onset   Hypertension Mother    Diabetes Mother    Stroke Mother        in 28s   Diabetes Father    Hypertension Father    Heart attack Father        had open heart surgery   Diabetes Sister         X 2   Hypertension Sister    Diabetes Brother        X2   Heart attack Brother         MI in 44s   Transient ischemic attack Brother        in 17s   Kidney failure Brother    Hypertension Brother    Kidney failure Brother        renal  transplant   Diabetes Brother    COPD Neg Hx    Asthma Neg Hx    Cancer Neg Hx    Colon polyps Neg Hx    Colon cancer Neg Hx    Esophageal cancer Neg Hx    Rectal cancer Neg Hx    Stomach cancer Neg Hx     SOCIAL HISTORY: Social History   Tobacco Use   Smoking status: Never   Smokeless tobacco: Never  Vaping Use   Vaping status: Never Used  Substance Use Topics  Alcohol use: Yes    Alcohol/week: 7.0 standard drinks of alcohol    Types: 7 Glasses of wine per week    Comment: red wine with dinner   Drug use: No   Social History   Social History Narrative   Never smoked, 1 child at home    Lives with wife-2025   Right handed   Drinks coffee one cup a day   One floor home   Lives with family   Self employed     OBJECTIVE: PHYSICAL EXAM: BP 118/75   Pulse 76   Resp 20   Ht 5' 6 (1.676 m)   Wt 134 lb (60.8 kg)   SpO2 100%   BMI 21.63 kg/m   General: General appearance: Awake and alert. No distress. Cooperative with exam.  Skin: No obvious rash or jaundice. HEENT: Atraumatic. Anicteric. Lungs: Non-labored breathing on room air  Heart: Regular Extremities: No edema. No obvious deformity.  Musculoskeletal: No obvious joint swelling. Psych: Affect appropriate.  Neurological: Mental Status: Alert. Speech fluent. No pseudobulbar affect Cranial Nerves: CNII: No RAPD. Visual fields grossly intact. CNIII, IV, VI: PERRL. No nystagmus. EOMI. CN V: Facial sensation intact bilaterally to fine touch. CN VII: Facial muscles symmetric and strong. No ptosis at rest. CN VIII: Hearing grossly intact bilaterally. CN IX: No hypophonia. CN X: Palate elevates symmetrically. CN XI: Full strength shoulder shrug bilaterally. CN XII: Tongue protrusion full and midline. No atrophy or fasciculations. No significant dysarthria Motor: Tone is normal. No atrophy.  Individual muscle group testing (MRC grade out of 5):  Movement     Neck flexion 5    Neck extension 5      Right Left   Shoulder abduction 5 5   Elbow flexion 5 5   Elbow extension 5 5   Finger abduction - FDI 5 5   Finger abduction - ADM 5 5   Finger extension 5 5   Finger distal flexion - 2/3 5 5    Finger distal flexion - 4/5 5 5    Thumb flexion - FPL 5 5   Thumb abduction - APB 5 5    Hip flexion 5 5   Knee extension 5 5   Knee flexion 5 5   Dorsiflexion 5 5   Plantarflexion 5 5   Inversion 5 5   Eversion 5 5   Great toe extension 5 5   Great toe flexion 5 5     Reflexes:  Right Left   Bicep 2+ 2+   Tricep 2+ 2+   BrRad 2+ 2+   Knee 2+ 2+   Ankle 2+ 2+    Pathological Reflexes: Babinski: flexor response bilaterally Hoffman: absent bilaterally Troemner: absent bilaterally Sensation: Pinprick: Diminished on bottom of feet then the top of feet, but otherwise intact Vibration: Intact in all extremities Proprioception: Intact in bilateral great toes Coordination: Intact finger-to- nose-finger bilaterally. Romberg negative. Gait: Able to rise from chair with arms crossed unassisted. Normal, narrow-based gait.  Lab and Test Review: Internal labs: 08/11/23: HbA1c: 6.2 Lipid panel: tChol 197, LDL 109, TG 121.0 Hepatic function wnl BMP significant for glucose of 128  TSH (03/11/23): wnl Vit D (03/11/23) wnl B12 (06/28/22): 595  Imaging/Procedures: Right foot xray (10/10/22): FINDINGS: Normal bone mineralization. Joint spaces are preserved. Minimal chronic enthesopathic change at the Achilles insertion on the calcaneus. No acute fracture or dislocation.   IMPRESSION: Minimal chronic enthesopathic change at the Achilles insertion on the calcaneus.  Cervical spine xray (07/19/19): IMPRESSION:  1. No fracture or acute finding. 2. Degenerative changes most evident at C6-C7, mildly progressed when compared to the prior radiographs.  MRI lumbar spine w/wo contrast (10/01/2005): IMPRESSION:  1. Status post left hemilaminectomy at L4-5.  The thecal sac is decompressed, but  there is some narrowing in the left lateral recess with moderate biforaminal narrowing.   2.  Facet arthropathy L5-S1 is noted.   MRI brain w/wo contrast (02/17/2004): IMPRESSION:  Negative MRI of the brain. Small amount of fluid in the right mastoid region of doubtful significance in this setting.     MRI cervical spine wo contrast (07/25/2003): IMPRESSION  1. Degenerative cervical spondylosis with associated degenerative disc disease. There is osteophytic ridging and diffuse bulging discs with the most notable findings being right foraminal stenosis at C5-6 and left foraminal encroachment at C6-7.   ASSESSMENT: PRADEEP BEAUBRUN is a 68 y.o. male who presents for evaluation of tingling and burning in bilateral feet and less occasionally in hands. He has a relevant medical history of lumbar radiculopathy s/p left hemilaminectomy at L4-5, HTN, HLD, plantar fasciitis, pre-DM, GERD. His neurological examination is pertinent for diminished pin prick sensation in bottom of feet bilateral, but with intact strength and reflexes throughout. Available diagnostic data is significant for HbA1c of 6.2 and B12 of 595. Remote MRI lumbar and cervical spine did show areas of foraminal stenosis. The etiology of symptoms is currently unclear but neuropathy or radiculopathy is certainly possible. I will get labs and EMG to start work up.   Of note, patient mentioned poor memory at the end of the appointment. He did endorse poor sleep. I explained that this is the most likely cause, but this could be explored further in the future if needed.  PLAN: -Blood work: B1, IFE -EMG: PN (R > L) -May consider skin biopsy if EMG is normal -Start gabapentin  100 mg at bedtime -Lidocaine cream PRN -Alpha lipoic acid 600 mg daily  -Return to clinic to be determined  The impression above as well as the plan as outlined below were extensively discussed with the patient (in the company of wife) who voiced understanding. All questions  were answered to their satisfaction.  When available, results of the above investigations and possible further recommendations will be communicated to the patient via telephone/MyChart. Patient to call office if not contacted after expected testing turnaround time.   Total time spent reviewing records, interview, history/exam, documentation, and coordination of care on day of encounter:  55 min   Thank you for allowing me to participate in patient's care.  If I can answer any additional questions, I would be pleased to do so.  Venetia Potters, MD   CC: Norleen Lynwood ORN, MD [address] [place] [address]  CC: Referring provider: Norleen Lynwood ORN, MD [address] [place],  [place] [address]

## 2023-08-13 NOTE — Progress Notes (Signed)
 The test results show that your current treatment is OK, as the tests are stable.  Please continue the same plan.  There is no other need for change of treatment or further evaluation based on these results, at this time.  thanks

## 2023-08-13 NOTE — Progress Notes (Unsigned)
  Cardiology Office Note:  .   Date:  08/13/2023  ID:  Kevin Farley, DOB [birth year], MRN [id number] PCP: Norleen Lynwood ORN, MD  [facility] Providers Cardiologist:  Newman Lawrence, MD PCP: Norleen Lynwood ORN, MD  No chief complaint on file.    Kevin Farley is a 68 y.o. male with hypertension, hyperlipidemia, elevated coronary calcium  score  Discussed the use of AI scribe software for clinical note transcription with the patient, who gave verbal consent to proceed.  History of Present Illness       There were no vitals filed for this visit.    ROS      Studies Reviewed: .        *** Labs 07/2023: Chol 197, TG 121, HDL 63, LDL 109 HbA1C 6.2% Cr 0.91 ***  CT chest without contrast 07/2021: 1. A 1 cm calcified granuloma in the left upper lobe. Faint area of haziness in the right middle lobe, likely post inflammatory in etiology. 2. Coronary vascular calcification of the LAD. 3. Cholelithiasis. 4. Aortic Atherosclerosis (ICD10-I70.0).    CT cardiac scoring 07/2021: Coronary arteries: Normal origins. Coronary Calcium  Score: Left main: 51.9 Left anterior descending artery: 1.6 Left circumflex artery: 0 Right coronary artery: 1.44 Total: 54.9 Percentile: 64th   Pericardium: Normal. Ascending Aorta: Normal caliber.    1. Small patchy densities in the right lung as described. These densities are likely post infectious or inflammatory but age indeterminate. Recommend correlation with any clinical symptoms. 2. Slowly enlarging nodule in the left upper lobe with central calcifications. Findings are suggestive for benign hamartoma or granuloma.     Risk Assessment/Calculations:   {Does this patient have ATRIAL FIBRILLATION?[phone]}    Physical Exam   VISIT DIAGNOSES: No diagnosis found.   Kevin Farley is a 68 y.o. male with *** Assessment and Plan Assessment & Plan       {Are you ordering a CV Procedure (e.g. stress test, cath, DCCV,  TEE, etc)?   Press F2        :[id number]}    No orders of the defined types were placed in this encounter.    F/u in ***  Signed, Newman JINNY Lawrence, MD

## 2023-08-14 ENCOUNTER — Telehealth (HOSPITAL_COMMUNITY): Payer: Self-pay | Admitting: *Deleted

## 2023-08-14 ENCOUNTER — Other Ambulatory Visit (HOSPITAL_COMMUNITY): Payer: Self-pay

## 2023-08-14 ENCOUNTER — Encounter (HOSPITAL_COMMUNITY): Payer: Self-pay

## 2023-08-14 ENCOUNTER — Telehealth: Payer: Self-pay

## 2023-08-14 ENCOUNTER — Ambulatory Visit: Attending: Cardiology | Admitting: Cardiology

## 2023-08-14 ENCOUNTER — Encounter: Payer: Self-pay | Admitting: Cardiology

## 2023-08-14 VITALS — BP 120/82 | HR 76 | Ht 66.0 in | Wt 134.2 lb

## 2023-08-14 DIAGNOSIS — E782 Mixed hyperlipidemia: Secondary | ICD-10-CM | POA: Diagnosis not present

## 2023-08-14 DIAGNOSIS — R931 Abnormal findings on diagnostic imaging of heart and coronary circulation: Secondary | ICD-10-CM | POA: Diagnosis not present

## 2023-08-14 DIAGNOSIS — I10 Essential (primary) hypertension: Secondary | ICD-10-CM

## 2023-08-14 DIAGNOSIS — I2510 Atherosclerotic heart disease of native coronary artery without angina pectoris: Secondary | ICD-10-CM | POA: Diagnosis not present

## 2023-08-14 MED ORDER — CLOPIDOGREL BISULFATE 75 MG PO TABS: 75.0000 mg | ORAL_TABLET | Freq: Every day | ORAL | 3 refills | Status: DC

## 2023-08-14 MED ORDER — EMPAGLIFLOZIN 10 MG PO TABS: 10.0000 mg | ORAL_TABLET | Freq: Every day | ORAL | 3 refills | Status: DC

## 2023-08-14 MED ORDER — CLOPIDOGREL BISULFATE 75 MG PO TABS
75.0000 mg | ORAL_TABLET | Freq: Every day | ORAL | 3 refills | Status: DC
  Filled 2023-08-14 (×2): qty 90, 90d supply, fill #0
  Filled 2023-10-28: qty 90, 90d supply, fill #1
  Filled 2024-02-03: qty 90, 90d supply, fill #2

## 2023-08-14 MED ORDER — EMPAGLIFLOZIN 10 MG PO TABS
10.0000 mg | ORAL_TABLET | Freq: Every day | ORAL | 3 refills | Status: DC
  Filled 2023-08-14: qty 90, 90d supply, fill #0
  Filled 2023-09-25 – 2023-10-28 (×3): qty 90, 90d supply, fill #1
  Filled 2024-01-27: qty 90, 90d supply, fill #2

## 2023-08-14 NOTE — Patient Instructions (Addendum)
 Medication Instructions:  START Plavix 75 mg daily START Jardiance 10 mg daily *If you need a refill on your cardiac medications before your next appointment, please call your pharmacy*  Testing/Procedures: Exercise Stress Test  Your physician has requested that you have en exercise stress myoview . For further information please visit [email or web address] Please follow instruction sheet, as given.   Follow-Up: At [facility], you and your health needs are our priority.  As part of our continuing mission to provide you with exceptional heart care, our providers are all part of one team.  This team includes your primary Cardiologist (physician) and Advanced Practice Providers or APPs (Physician Assistants and Nurse Practitioners) who all work together to provide you with the care you need, when you need it.  Your next appointment:   AS NEEDED  Provider:   Newman JINNY Lawrence, MD    Diet & Lifestyle recommendations:  Physical activity recommendation (The Physical Activity Guidelines for Americans. JAMA 2018;Nov 12) At least 150-300 minutes a week of moderate-intensity, or 75-150 minutes a week of vigorous-intensity aerobic physical activity, or an equivalent combination of moderate- and vigorous-intensity aerobic activity. Adults should perform muscle-strengthening activities on 2 or more days a week. Older adults should do multicomponent physical activity that includes balance training as well as aerobic and muscle-strengthening activities. Benefits of increased physical activity include lower risk of mortality including cardiovascular mortality, lower risk of cardiovascular events and associated risk factors (hypertension and diabetes), and lower risk of many cancers (including bladder, breast, colon, endometrium, esophagus, kidney, lung, and stomach). Additional improvments have been seen in cognition, risk of dementia, anxiety and depression, improved bone health, lower risk of falls,  and associated injuries.  Dietary recommendation The 2019 ACC/AHA guidelines promote nutrition as a main fixture of cardiovascular wellness, with a recommendation for a varied diet of fruit, vegetables, fish, legumes, and whole grains (Class I), as well as recommendations to reduce sodium, cholesterol, processed meats, and refined sugars (Class IIa recommendation).10 Sodium intake, a topic of some controversy as of late, is recommended to be kept at 1,500 mg/day or less, far below the average daily intake in the [place]  of 3,409 mg/day, and notably below that of previous [place]  recommendations for 300mg /day.10,11 For those unable to reach 1,500 mg/day, they recommend at least a reduction of 1000 mg/day.  A Pesco-Mediterranean Diet With Intermittent Fasting: JACC Review Topic of the Week. J Am Coll Cardiol 2020;76:1484-1493 Pesco-Mediterranean diet, it is supplemented with extra-virgin olive oil (EVOO), which is the principle fat source, along with moderate amounts of dairy (particularly yogurt and cheese) and eggs, as well as modest amounts of alcohol consumption (ideally red wine with the evening meal), but few red and processed meats.

## 2023-08-14 NOTE — Telephone Encounter (Signed)
 Spoke with pt who asked that instructions for MPI be sent Via mychart. Pt was told of importance of no caffeine for 12 Hr. Instructions sent via my chart.

## 2023-08-14 NOTE — Addendum Note (Signed)
 Addended by: MANDA BOTTCHER B on: 08/14/2023 09:17 AM   Modules accepted: Orders

## 2023-08-14 NOTE — Telephone Encounter (Signed)
 Apparently, we do not have Jardiance samples.

## 2023-08-14 NOTE — Telephone Encounter (Signed)
 Medication name/dosage: Samples List: Jardiance 10 mg  Administration instructions: Take one tablet by mouth daily  Reason for samples: Reason for samples: new start  Ordering provider:Dr. Elmira   *Once above information entered, route the phone encounter to CV DIV MAG ST SAMPLES and send Teams message to team member assigned to Samples for the day.

## 2023-08-19 ENCOUNTER — Other Ambulatory Visit: Payer: Self-pay | Admitting: Cardiology

## 2023-08-19 DIAGNOSIS — I2510 Atherosclerotic heart disease of native coronary artery without angina pectoris: Secondary | ICD-10-CM

## 2023-08-21 ENCOUNTER — Ambulatory Visit: Payer: Self-pay | Admitting: Cardiology

## 2023-08-21 ENCOUNTER — Ambulatory Visit (HOSPITAL_COMMUNITY)
Admission: RE | Admit: 2023-08-21 | Discharge: 2023-08-21 | Disposition: A | Discharge status: Home / Self Care | Source: Ambulatory Visit | Attending: Cardiology | Admitting: Cardiology

## 2023-08-21 DIAGNOSIS — I2510 Atherosclerotic heart disease of native coronary artery without angina pectoris: Secondary | ICD-10-CM | POA: Insufficient documentation

## 2023-08-21 LAB — MYOCARDIAL PERFUSION IMAGING
Angina Index: 0
Duke Treadmill Score: 8
Estimated workload: 10.1
Exercise duration (min): 8 min
Exercise duration (sec): 0 s
LV dias vol: 70 mL (ref 62–150)
LV sys vol: 17 mL (ref 4.2–5.8)
MPHR: 154 {beats}/min
Nuc Stress EF: 76 %
Peak HR: 148 {beats}/min
Percent HR: 96 %
RPE: 18
Rest HR: 63 {beats}/min
Rest Nuclear Isotope Dose: 10.9 mCi
SDS: 0
SRS: 0
SSS: 0
ST Depression (mm): 0 mm
Stress Nuclear Isotope Dose: 32.7 mCi
TID: 0.9

## 2023-08-21 MED ORDER — TECHNETIUM TC 99M TETROFOSMIN IV KIT
10.9000 | PACK | Freq: Once | INTRAVENOUS | Status: AC | PRN
  Administered 2023-08-21: 10.9 via INTRAVENOUS

## 2023-08-21 MED ORDER — TECHNETIUM TC 99M TETROFOSMIN IV KIT
32.7000 | PACK | Freq: Once | INTRAVENOUS | Status: AC | PRN
  Administered 2023-08-21: 32.7 via INTRAVENOUS

## 2023-08-27 ENCOUNTER — Ambulatory Visit: Admitting: Neurology

## 2023-08-27 ENCOUNTER — Other Ambulatory Visit (HOSPITAL_COMMUNITY): Payer: Self-pay

## 2023-08-27 ENCOUNTER — Other Ambulatory Visit

## 2023-08-27 ENCOUNTER — Encounter: Payer: Self-pay | Admitting: Neurology

## 2023-08-27 VITALS — BP 118/75 | HR 76 | Resp 20 | Ht 66.0 in | Wt 134.0 lb

## 2023-08-27 DIAGNOSIS — R202 Paresthesia of skin: Secondary | ICD-10-CM | POA: Diagnosis not present

## 2023-08-27 DIAGNOSIS — R200 Anesthesia of skin: Secondary | ICD-10-CM | POA: Diagnosis not present

## 2023-08-27 DIAGNOSIS — R7303 Prediabetes: Secondary | ICD-10-CM | POA: Diagnosis not present

## 2023-08-27 DIAGNOSIS — R413 Other amnesia: Secondary | ICD-10-CM | POA: Diagnosis not present

## 2023-08-27 DIAGNOSIS — F1090 Alcohol use, unspecified, uncomplicated: Secondary | ICD-10-CM | POA: Diagnosis not present

## 2023-08-27 MED ORDER — AMITRIPTYLINE HCL 100 MG PO TABS
50.0000 mg | ORAL_TABLET | Freq: Every evening | ORAL | 1 refills | Status: DC | PRN
  Filled 2023-10-28: qty 30, 30d supply, fill #0

## 2023-08-27 MED ORDER — DEXLANSOPRAZOLE 60 MG PO CPDR
60.0000 mg | DELAYED_RELEASE_CAPSULE | Freq: Every day | ORAL | 3 refills | Status: DC
  Filled 2023-10-13: qty 30, 30d supply, fill #0

## 2023-08-27 MED ORDER — ROSUVASTATIN CALCIUM 40 MG PO TABS: 40.0000 mg | ORAL_TABLET | Freq: Every day | ORAL | 3 refills | Status: DC

## 2023-08-27 MED ORDER — REPATHA SURECLICK 140 MG/ML ~~LOC~~ SOAJ
140.0000 mg | SUBCUTANEOUS | 3 refills | Status: DC
  Filled 2023-09-26: qty 6, 84d supply, fill #0

## 2023-08-27 MED ORDER — EMPAGLIFLOZIN 10 MG PO TABS
10.0000 mg | ORAL_TABLET | Freq: Every day | ORAL | 3 refills | Status: DC
  Filled 2023-09-25 – 2023-10-01 (×5): qty 90, 90d supply, fill #0

## 2023-08-27 MED ORDER — EZETIMIBE 10 MG PO TABS
10.0000 mg | ORAL_TABLET | Freq: Every day | ORAL | 3 refills | Status: DC
  Filled 2023-09-26: qty 90, 90d supply, fill #0

## 2023-08-27 MED ORDER — CLOPIDOGREL BISULFATE 75 MG PO TABS: 75.0000 mg | ORAL_TABLET | Freq: Every day | ORAL | 3 refills | Status: DC

## 2023-08-27 MED ORDER — SILDENAFIL CITRATE 100 MG PO TABS: 50.0000 mg | ORAL_TABLET | Freq: Every day | ORAL | 11 refills | Status: DC | PRN

## 2023-08-27 MED ORDER — GABAPENTIN 100 MG PO CAPS
100.0000 mg | ORAL_CAPSULE | Freq: Every day | ORAL | 3 refills | Status: DC
  Filled 2023-08-27: qty 90, 90d supply, fill #0
  Filled 2023-11-18: qty 90, 90d supply, fill #1

## 2023-08-27 NOTE — Patient Instructions (Addendum)
 I saw you today for tingling and burning of the feet and less so the hands. This could be nerve damage (neuropathy), but it is not completely clear at this time.   I want to investigate further with the following: -Blood work today -Nerve testing with EMG (see more information below)  We will discuss next steps after these results.  For your symptoms: -Start gabapentin  100 mg at bedtime. We will likely need to go up on this, but lets see how you do with the lowest dose first.  You can also try Lidocaine cream as needed. Apply wear you have pain, tingling, or burning. Wear gloves to prevent your hands being numb. This can be bought over the counter at any drug store or online.  Alpha lipoic acid 600mg  daily has some research data suggesting it helps with nerve health. No major side effects other than <1% of people report upset stomach. This can be taken twice per day (1200mg  daily) if no relief obtained. You can buy this over the counter or online.  The physicians and staff at [facility] are committed to providing excellent care. You may receive a survey requesting feedback about your experience at our office. We strive to receive very good responses to the survey questions. If you feel that your experience would prevent you from giving the office a very good  response, please contact our office to try to remedy the situation. We may be reached at [phone]. Thank you for taking the time out of your busy day to complete the survey.  Venetia Potters, MD [facility]  ELECTROMYOGRAM AND NERVE CONDUCTION STUDIES (EMG/NCS) INSTRUCTIONS  How to Prepare The neurologist conducting the EMG will need to know if you have certain medical conditions. Tell the neurologist and other EMG lab personnel if you: Have a pacemaker or any other electrical medical device Take blood-thinning medications Have hemophilia, a blood-clotting disorder that causes prolonged bleeding Bathing Take a shower  or bath shortly before your exam in order to remove oils from your skin. Don't apply lotions or creams before the exam.  What to Expect You'll likely be asked to change into a hospital gown for the procedure and lie down on an examination table. The following explanations can help you understand what will happen during the exam.  Electrodes. The neurologist or a technician places surface electrodes at various locations on your skin depending on where you're experiencing symptoms. Or the neurologist may insert needle electrodes at different sites depending on your symptoms.  Sensations. The electrodes will at times transmit a tiny electrical current that you may feel as a twinge or spasm. The needle electrode may cause discomfort or pain that usually ends shortly after the needle is removed. If you are concerned about discomfort or pain, you may want to talk to the neurologist about taking a short break during the exam.  Instructions. During the needle EMG, the neurologist will assess whether there is any spontaneous electrical activity when the muscle is at rest - activity that isn't present in healthy muscle tissue - and the degree of activity when you slightly contract the muscle.  He or she will give you instructions on resting and contracting a muscle at appropriate times. Depending on what muscles and nerves the neurologist is examining, he or she may ask you to change positions during the exam.  After your EMG You may experience some temporary, minor bruising where the needle electrode was inserted into your muscle. This bruising should fade within  several days. If it persists, contact your primary care doctor.

## 2023-09-02 ENCOUNTER — Telehealth: Payer: Self-pay | Admitting: Neurology

## 2023-09-02 ENCOUNTER — Ambulatory Visit: Admitting: Neurology

## 2023-09-02 DIAGNOSIS — R200 Anesthesia of skin: Secondary | ICD-10-CM

## 2023-09-02 DIAGNOSIS — R202 Paresthesia of skin: Secondary | ICD-10-CM | POA: Diagnosis not present

## 2023-09-02 LAB — VITAMIN B1: Vitamin B1 (Thiamine): 16 nmol/L (ref 8–30)

## 2023-09-02 LAB — IMMUNOFIXATION ELECTROPHORESIS
IgG (Immunoglobin G), Serum: 1202 mg/dL (ref 600–1540)
IgM, Serum: 68 mg/dL (ref 50–300)
Immunoglobulin A: 293 mg/dL (ref 70–320)

## 2023-09-02 NOTE — Procedures (Signed)
 [facility]  [address] [place], [address]  [place], [place] [address] Tel: [phone] Fax: [phone] Test Date:  09/02/2023  Patient: Kevin Farley DOB: 05/10/1955 Physician: Venetia Potters, MD  Sex: Male Height: 5' 6 Ref Phys: Venetia Potters, MD  ID#: [id number]   Technician:    History: This is a 68 year old male with tingling and burning in his feet and hands.  NCV & EMG Findings: Extensive electrodiagnostic evaluation of the right upper and lower limbs shows: Right sural, superficial peroneal/fibular, median, ulnar, median-ulnar palmar, and radial sensory responses are within normal limits. Right peroneal/fibular (EDB), tibial (AH), median (APB), and ulnar (ADM) motor responses are within normal limits. Right H reflex latency is within normal limits. There is no evidence of active or chronic motor axon loss changes affecting any of the tested muscles on needle examination. Motor unit configuration and recruitment pattern is within normal limits.  Impression: This is a normal study. Specifically: No electrodiagnostic evidence of a large fiber sensorimotor neuropathy or myopathy. No electrodiagnostic evidence of a right cervical (C5-C8) or lumbosacral (L3-S1) motor radiculopathy. No electrodiagnostic evidence of a right median mononeuropathy at or distal to the wrist (ie: carpal tunnel syndrome).     ___________________________ Venetia Potters, MD    Nerve Conduction Studies Motor Nerve Results    Latency Amplitude F-Lat Segment Distance CV Comment  Site (ms) Norm (mV) Norm (ms)  (cm) (m/s) Norm   Right Fibular (EDB) Motor  Ankle 3.3  < 6.0 7.1  > 2.5        Bel fib head 10.4 - 6.0 -  Bel fib head-Ankle 31 44  > 40   Pop fossa 12.4 - 5.8 -  Pop fossa-Bel fib head 9 45 -   Right Median (APB) Motor  Wrist 2.5  < 4.0 8.3  > 5.0        Elbow 7.7 - 6.1 -  Elbow-Wrist 28 54  > 50   Right Tibial (AH) Motor  Ankle 3.1  < 6.0 5.8  > 4.0        Knee 13.0 - 5.4 -  Knee-Ankle  43 43  > 40   Right Ulnar (ADM) Motor  Wrist 1.78  < 3.1 9.0  > 7.0        Bel elbow 5.8 - 7.7 -  Bel elbow-Wrist 22.5 56  > 50   Ab elbow 7.6 - 7.3 -  Ab elbow-Bel elbow 10 56 -    Sensory Sites    Neg Peak Lat Amplitude (O-P) Segment Distance Velocity Comment  Site (ms) Norm (V) Norm  (cm) (ms)   Right Median Sensory  Wrist-Dig II 3.3  < 3.8 17  > 10 Wrist-Dig II 13    Right Median-Ulnar Palmar Sensory       Median  Palm-Wrist 2.2  < 2.2 36  > 10 Palm-Wrist 8         Ulnar  Palm-Wrist 1.93  < 2.2 19  > 5 Palm-Wrist 8    Right Radial Sensory  Forearm-Wrist 2.1  < 2.8 27  > 10 Forearm-Wrist 10    Right Superficial Fibular Sensory  14 cm-Ankle 2.4  < 4.6 5  > 3 14 cm-Ankle 14    Right Sural Sensory  Calf-Lat mall 3.7  < 4.6 5  > 3 Calf-Lat mall 14    Right Ulnar Sensory  Wrist-Dig V 2.6  < 3.2 11  > 5 Wrist-Dig V 11     H-Reflex Results  M-Lat H Lat H Neg Amp H-M Lat  Site (ms) (ms) Norm (mV) (ms)  Right Tibial H-Reflex  Pop fossa 6.2 33.9  < 35.0 0.52 27.7   Inter-Nerve Comparisons   Nerve 1 Value 1 Nerve 2 Value 2 Parameter Result Normal  Sensory Sites  R Median Palm-Wrist 2.2 ms R Ulnar Palm-Wrist 1.93 ms Peak Lat Diff 0.27 ms <0.40   Electromyography   Side Muscle Ins.Act Fibs Fasc Recrt Amp Dur Poly Activation Comment  Right Tib ant Nml Nml Nml Nml Nml Nml Nml Nml N/A  Right Gastroc MH Nml Nml Nml Nml Nml Nml Nml Nml N/A  Right FDL Nml Nml Nml Nml Nml Nml Nml Nml N/A  Right Rectus fem Nml Nml Nml Nml Nml Nml Nml Nml N/A  Right Gluteus med Nml Nml Nml Nml Nml Nml Nml Nml N/A  Right FDI Nml Nml Nml Nml Nml Nml Nml Nml N/A  Right EIP Nml Nml Nml Nml Nml Nml Nml Nml N/A  Right Pronator teres Nml Nml Nml Nml Nml Nml Nml Nml N/A  Right Biceps Nml Nml Nml Nml Nml Nml Nml Nml N/A  Right Triceps lat hd Nml Nml Nml Nml Nml Nml Nml Nml N/A  Right Deltoid Nml Nml Nml Nml Nml Nml Nml Nml N/A      Waveforms:  Motor           Sensory                H-Reflex

## 2023-09-02 NOTE — Telephone Encounter (Signed)
 Discussed the results of patient's EMG after the procedure today. EMG was normal with no evidence of a large fiber neuropathy or cervical or lumbosacral radiculopathy. There is still concern for small fiber neuropathy, so I recommended skin biopsy. Patient agrees. Will order today.  In terms of symptoms, gabapentin  100 mg at bedtime is helping some, but there is still burning, mostly at night. I recommended patient try to take 200 mg at bedtime and see if this helps better. He agreed to try this and let me know.  All questions were answered.   Venetia Potters, MD [facility]

## 2023-09-03 ENCOUNTER — Ambulatory Visit: Payer: Self-pay | Admitting: Neurology

## 2023-09-23 ENCOUNTER — Other Ambulatory Visit: Admitting: Neurology

## 2023-09-25 ENCOUNTER — Other Ambulatory Visit (HOSPITAL_COMMUNITY): Payer: Self-pay

## 2023-09-25 MED ORDER — EPINASTINE HCL 0.05 % OP SOLN
1.0000 [drp] | Freq: Two times a day (BID) | OPHTHALMIC | 3 refills | Status: AC
  Filled 2023-09-25: qty 5, 30d supply, fill #0
  Filled 2023-09-25 – 2023-09-26 (×2): qty 5, 25d supply, fill #0

## 2023-09-26 ENCOUNTER — Other Ambulatory Visit: Payer: Self-pay

## 2023-09-26 ENCOUNTER — Other Ambulatory Visit (HOSPITAL_COMMUNITY): Payer: Self-pay

## 2023-09-29 ENCOUNTER — Other Ambulatory Visit (HOSPITAL_COMMUNITY): Payer: Self-pay

## 2023-09-30 ENCOUNTER — Other Ambulatory Visit (HOSPITAL_COMMUNITY): Payer: Self-pay

## 2023-10-01 ENCOUNTER — Encounter: Payer: Self-pay | Admitting: Pharmacist Clinician (PhC)/ Clinical Pharmacy Specialist

## 2023-10-01 ENCOUNTER — Other Ambulatory Visit (HOSPITAL_COMMUNITY): Payer: Self-pay

## 2023-10-01 ENCOUNTER — Ambulatory Visit: Attending: Cardiology | Admitting: Pharmacist Clinician (PhC)/ Clinical Pharmacy Specialist

## 2023-10-01 ENCOUNTER — Other Ambulatory Visit: Payer: Self-pay | Admitting: Pharmacist Clinician (PhC)/ Clinical Pharmacy Specialist

## 2023-10-01 DIAGNOSIS — E7801 Familial hypercholesterolemia: Secondary | ICD-10-CM

## 2023-10-01 DIAGNOSIS — E782 Mixed hyperlipidemia: Secondary | ICD-10-CM | POA: Diagnosis not present

## 2023-10-01 HISTORY — DX: Familial hypercholesterolemia: E78.01

## 2023-10-01 NOTE — Patient Instructions (Signed)
 Your Results:             Your most recent labs Goal  Total Cholesterol 197 < 200  Triglycerides 121 < 150  HDL (happy/good cholesterol) 63.8 > 40  LDL (lousy/bad cholesterol 109 < 70   Medication changes:  We will start the process to get Leqvio covered by your insurance.  Once the prior authorization is complete, I will call/send a MyChart message to let you know and confirm pharmacy information.   You will take the first two doses three months apart, then every 6 months thereafter  Lab orders:  We want to repeat labs after second dose.   We will send you a lab order to remind you once we get closer to that time.     Thank you for choosing [facility]

## 2023-10-01 NOTE — Assessment & Plan Note (Signed)
 Assessment: Patient with ASCVD/familial hyperlipidemia not at LDL goal of < 70 Most recent LDL 109 on 08/11/23 Has been compliant with high intensity statin/ezetimibe  : rosuvastatin  40, ezetimibe  10 Not able to tolerate other statins secondary to myalgias Reviewed options for lowering LDL cholesterol, including PCSK-9 inhibitors and inclisiran.  Discussed mechanisms of action, dosing, side effects, potential decreases in LDL cholesterol and costs.  Also reviewed potential options for patient assistance.  Plan: Patient agreeable to starting Leqvio Repeat labs after:  second dose Lipid Liver function

## 2023-10-01 NOTE — Progress Notes (Signed)
 Office Visit    Patient Name: [name] Date of Encounter: 10/01/2023  Primary Care Provider:  Anner Alm ORN, MD Primary Cardiologist:  Newman JINNY Lawrence, MD  Chief Complaint    Hyperlipidemia - familial  Significant Past Medical History   CAD 6/23 CAC = 54.9 (64th percentile)  HTN Controlled with amlodipine , losartan   preDM 6/25 A1c 6.2     Allergies  Allergen Reactions   Aspirin     REACTION: hives States tolerates IBU & acetaminophen  OK   Other     Walnuts & pecans cause hives   Codeine     Itching with codeine containing cough syrup 9/15   Pecan Nut (Diagnostic) Hives   Zetia  [Ezetimibe ]     See 09/22/14 musculoskeletal pains   Ezetimibe -Simvastatin Hives    REACTION: myalgia    History of Present Illness    [name] is a 68 y.o. male patient of Dr Lawrence, in the office today to discuss options for cholesterol management.  Lab records going back to 2009 show TC at 340 with TG at 308 and HLD 46.  This equates to an LDL cholesterol of 231.  An NMR in 2007 showed LDL of 273 and repeat in 2010 at 202.  He tried simvastatin in the past, but developed myalgias.  Has been on rosuvastatin  40 mg and ezetimibe  for some time.  Was prescribed Repatha , but not comfortable with q14d injections and would prefer to try Leqvio.    Insurance Carrier:  [employer] [phone]  Healthwell: no     LDL Cholesterol goal:  LDL < 70  Current Medications: ezetimibe  10 mg daily, rosuvastatin  40 mg daily    Previously tried:  pravastatin , simvastatin - myalgias  Family Hx: father and 2 brothers all with MI, CABG, mother with strokes hypertension, started late 4's, died at 75; son with high cholesterol   Social Hx: Tobacco: no Alcohol:  wine in the evenings  Diet:    salad or broccoli every meal, then chicken, lamb, fish, occasional beef; snacking usually nuts; no sweets  Exercise: active in yard, walking 1-2 miles  Accessory Clinical Findings   Lab Results   Component Value Date   CHOL 197 08/11/2023   HDL 63.80 08/11/2023   LDLCALC 109 (H) 08/11/2023   LDLDIRECT 150.0 11/18/2019   TRIG 121.0 08/11/2023   CHOLHDL 3 08/11/2023    No results found for: LIPOA  Lab Results  Component Value Date   ALT 38 08/11/2023   AST 27 08/11/2023   ALKPHOS 40 08/11/2023   BILITOT 0.5 08/11/2023   Lab Results  Component Value Date   CREATININE 0.91 08/11/2023   BUN 16 08/11/2023   NA 141 08/11/2023   K 4.3 08/11/2023   CL 106 08/11/2023   CO2 26 08/11/2023   Lab Results  Component Value Date   HGBA1C 6.2 08/11/2023    Home Medications    Current Outpatient Medications  Medication Sig Dispense Refill   amitriptyline  (ELAVIL ) 100 MG tablet Take 1/2-1 tablet (50-100 mg total) by mouth at bedtime as needed for pain and sleep. 90 tablet 1   amLODipine  (NORVASC ) 5 MG tablet Take 1 tablet (5 mg total) by mouth daily. 90 tablet 3   Blood Glucose Monitoring Suppl (ONE TOUCH ULTRA 2) w/Device KIT Use as directed once daily E11.9 1 kit 0   clopidogrel  (PLAVIX ) 75 MG tablet Take 1 tablet (75 mg total) by mouth daily. 90 tablet 3   Coenzyme Q10 (CO Q-10)  100 MG CAPS Take 1 capsule by mouth daily.     dexlansoprazole  (DEXILANT ) 60 MG capsule Take 1 capsule (60 mg total) by mouth daily. 90 capsule 3   empagliflozin  (JARDIANCE ) 10 MG TABS tablet Take 1 tablet (10 mg total) by mouth daily before breakfast. 90 tablet 3   Epinastine HCl 0.05 % ophthalmic solution Place 1 drop into both eyes 2 (two) times daily. 5 mL 3   ezetimibe  (ZETIA ) 10 MG tablet Take 1 tablet (10 mg total) by mouth daily. 90 tablet 3   fluticasone  (FLONASE ) 50 MCG/ACT nasal spray SPRAY 2 SPRAYS INTO EACH NOSTRIL DAILY 48 mL 3   gabapentin  (NEURONTIN ) 100 MG capsule Take 1 capsule (100 mg total) by mouth at bedtime. 90 capsule 3   Lancets MISC Use as directed once daily E11.9 100 each 3   losartan  (COZAAR ) 100 MG tablet Take 1 tablet (100 mg total) by mouth daily. 90 tablet 3    Multiple Vitamin (MULTIVITAMIN) tablet Take 1 tablet by mouth daily.     ONETOUCH ULTRA test strip USE AS INSTRUCTED ONCE DAILY E11.9 100 strip 12   rosuvastatin  (CRESTOR ) 40 MG tablet Take 1 tablet (40 mg total) by mouth every other day. 45 tablet 3   sildenafil  (VIAGRA ) 100 MG tablet Take 1/2-1 tablet (50-100 mg total) by mouth daily as needed for erectile dysfunction. 10 tablet 11   No current facility-administered medications for this visit.     Assessment & Plan    Hyperlipidemia Assessment: Patient with ASCVD/familial hyperlipidemia not at LDL goal of < 70 Most recent LDL 109 on 08/11/23 Has been compliant with high intensity statin/ezetimibe  : rosuvastatin  40, ezetimibe  10 Not able to tolerate other statins secondary to myalgias Reviewed options for lowering LDL cholesterol, including PCSK-9 inhibitors and inclisiran.  Discussed mechanisms of action, dosing, side effects, potential decreases in LDL cholesterol and costs.  Also reviewed potential options for patient assistance.  Plan: Patient agreeable to starting Leqvio Repeat labs after:  second dose Lipid Liver function    Allean Mink, PharmD CPP [facility] [address]   [place], [place] [address] [phone]  10/01/2023, 2:14 PM

## 2023-10-02 ENCOUNTER — Telehealth: Payer: Self-pay | Admitting: Pharmacy Technician

## 2023-10-02 NOTE — Telephone Encounter (Signed)
 error

## 2023-10-03 ENCOUNTER — Telehealth: Payer: Self-pay | Admitting: Pharmacy Technician

## 2023-10-03 NOTE — Telephone Encounter (Signed)
 Kristin,  Please refer to the letter from the insurance in regards to the Leqvio.  They are stating that the patient must have tried and failed Repahta for at least 12 weeks. The letter has been scanned to the media tab for your review.  This will likely be a denial.  Auth Submission: DENIED Site of care: Site of care: [facility] Payer: [employer] MEDICARE Medication & CPT/J Code(s) submitted: Leqvio (Inclisiran) [id number] Diagnosis Code:  Route of submission (phone, fax, portal): PORTAL Phone # Fax # Auth type: Buy/Bill PB Units/visits requested:  Reference number:  Approval from:  to    Thanks Luke

## 2023-10-07 ENCOUNTER — Ambulatory Visit (INDEPENDENT_AMBULATORY_CARE_PROVIDER_SITE_OTHER): Admitting: Neurology

## 2023-10-07 DIAGNOSIS — R202 Paresthesia of skin: Secondary | ICD-10-CM | POA: Diagnosis not present

## 2023-10-07 NOTE — Progress Notes (Signed)
 Punch Biopsy Procedure Note  Preprocedure Diagnosis: paresthesia of skin   Postprocedure Diagnosis: same  Locations: Site 1: left lateral distal leg;  Site 2: left lateral thigh  Indications: r/o small fiber neuropathy  Anesthesia: 5 mL Lidocaine 1% with epinephrine  Procedure Details Patient informed of the risks (including but not limited to bleeding, pain, infection, scar and infection) and benefits of the procedure.  Informed consent obtained.  The areas which were chosen for biopsy, as above, and surrounding areas were given a sterile prep using alcohol and iodine. The skin was then stretched perpendicular to the skin tension lines and sample removed using the 3 mm punch. Pressure applied, hemostasis achieved.   Dressing applied. The specimen(s) was sent for pathologic examination. The patient tolerated the procedure well.  Estimated Blood Loss: 0 ml  Condition: Stable  Complications: none.  Plan: 1. Instructed to keep the wound dry and covered for 24h and clean thereafter. 2. Warning signs of infection were reviewed.    Jacquelyne Balint, MD [facility]

## 2023-10-08 ENCOUNTER — Ambulatory Visit: Admitting: Internal Medicine

## 2023-10-08 ENCOUNTER — Ambulatory Visit: Payer: Self-pay

## 2023-10-08 ENCOUNTER — Other Ambulatory Visit (HOSPITAL_COMMUNITY): Payer: Self-pay

## 2023-10-08 ENCOUNTER — Encounter: Payer: Self-pay | Admitting: Internal Medicine

## 2023-10-08 VITALS — BP 122/62 | HR 78 | Temp 98.2°F | Ht 66.0 in | Wt 136.2 lb

## 2023-10-08 DIAGNOSIS — E782 Mixed hyperlipidemia: Secondary | ICD-10-CM | POA: Diagnosis not present

## 2023-10-08 DIAGNOSIS — R7303 Prediabetes: Secondary | ICD-10-CM | POA: Diagnosis not present

## 2023-10-08 DIAGNOSIS — I10 Essential (primary) hypertension: Secondary | ICD-10-CM

## 2023-10-08 DIAGNOSIS — H60501 Unspecified acute noninfective otitis externa, right ear: Secondary | ICD-10-CM | POA: Diagnosis not present

## 2023-10-08 DIAGNOSIS — E559 Vitamin D deficiency, unspecified: Secondary | ICD-10-CM | POA: Diagnosis not present

## 2023-10-08 MED ORDER — CIPROFLOXACIN HCL 500 MG PO TABS
500.0000 mg | ORAL_TABLET | Freq: Two times a day (BID) | ORAL | 0 refills | Status: AC
  Filled 2023-10-08: qty 20, 10d supply, fill #0

## 2023-10-08 MED ORDER — NEOMYCIN-POLYMYXIN-HC 3.5-10000-1 OT SOLN
4.0000 [drp] | Freq: Four times a day (QID) | OTIC | 0 refills | Status: DC
Start: 2023-10-08 — End: 2023-11-26
  Filled 2023-10-08: qty 10, 10d supply, fill #0

## 2023-10-08 NOTE — Patient Instructions (Signed)
 Please take all new medication as prescribed  - the antibiotic cipro , and the ear drop antibiotics  OK to start the Repatha  as you mentioned  Please continue all other medications as before, including the plavix  as you mentioned  Please have the pharmacy call with any other refills you may need.  Please continue your efforts at being more active, low cholesterol diet, and weight control.  Please keep your appointments with your specialists as you may have planned  Please make an Appointment to return in Nov 2025, or sooner if needed, also with Lab Appointment for testing done 3-5 days before at the FIRST FLOOR Lab (so this is for TWO appointments - please see the scheduling desk as you leave)

## 2023-10-08 NOTE — Telephone Encounter (Signed)
 FYI Only or Action Required?: FYI only for provider.  Patient was last seen in primary care on 06/17/2023 by Norleen Lynwood ORN, MD.  Called Nurse Triage reporting Otalgia.  Symptoms began several days ago.  Interventions attempted: OTC medications: Tylenol .  Symptoms are: bilateral ear pain (worse on right), right ear clear discharge gradually worsening.  Triage Disposition: See Physician Within 24 Hours  Patient/caregiver understands and will follow disposition?: Yes            Copied from CRM #[id number]. Topic: Clinical - Red Word Triage >> Oct 08, 2023  7:57 AM Suzen RAMAN wrote: Red Word that prompted transfer to Nurse Triage: Ear Pain seeking an appt today Reason for Disposition  Ear pain  Answer Assessment - Initial Assessment Questions 1. LOCATION: Which ear is involved?      Wife states it was the left ear first and now it is the right.  2. COLOR: What is the color of the discharge?      Clear drainage.  3. CONSISTENCY: How runny is the discharge? Could it be water?      Thin.  4. ONSET: When did you first notice the discharge?     Couple days ago.  5. PAIN: Is there any earache? How bad is it?  (Scale 0-10; none, mild, moderate or severe)     Both, less in the left and worse in the right. 6/10.  6. OBJECTS: Have you put anything in your ear? (e.g., Q-tip, other object)      No injury or objects in ears. No airplanes recently or submerging in water.  7. OTHER SYMPTOMS: Do you have any other symptoms? (e.g., headache, fever, dizziness, vomiting, runny nose)     Denies fever, headaches, dizziness, head injuries, nausea, vomiting, cold or allergy symptoms.  8. PREGNANCY: Is there any chance you are pregnant? When was your last menstrual period?     N/A.  Protocols used: Ear - Discharge-A-AH

## 2023-10-08 NOTE — Progress Notes (Addendum)
 Patient ID: Kevin Farley, male   DOB: 1955-07-27, 68 y.o.   MRN: [id number]        Chief Complaint: follow up HTN, HLD and hyperglycemia , right external otitis, low vit d       HPI:  Kevin Farley is a 68 y.o. male here with c/o right ear pain and slight d/c x 2 days, without fever or worsening sinus symptoms.  Pt denies chest pain, increased sob or doe, wheezing, orthopnea, PND, increased LE swelling, palpitations, dizziness or syncope.  Pt denies polydipsia, polyuria, or new focal neuro s/s.    Pt denies fever, wt loss, night sweats, loss of appetite, or other constitutional symptoms         Wt Readings from Last 3 Encounters:  11/26/23 134 lb (60.8 kg)  10/08/23 136 lb 3.2 oz (61.8 kg)  08/27/23 134 lb (60.8 kg)   BP Readings from Last 3 Encounters:  11/26/23 128/82  10/08/23 122/62  08/27/23 118/75         Past Medical History:  Diagnosis Date   Allergic rhinitis    Colon polyps    adenomatous 2011 & 2014   Essential familial hypercholesterolemia 10/01/2023   Family history of ischemic heart disease    GERD (gastroesophageal reflux disease)    HTN (hypertension) 04/16/2019   Hyperlipidemia    Other abnormal glucose    A1c 6% in 06/2009   Supraspinatus tendon tear    Past Surgical History:  Procedure Laterality Date   BACK SURGERY  02/12/2004   Dr Gaither, [place]   cardic catherization  02/12/2003   negative; Dr Lorn   colon polyps  2011 & 2014   Dr.Jacobs    COLONOSCOPY     CORNEAL TRANSPLANT Left 02/12/1991   Dr Rosan   nasal bone surgery  02/11/2005   UPPER GASTROINTESTINAL ENDOSCOPY  02/11/2009   mild gastritis; Dr Teressa    reports that he has never smoked. He has never used smokeless tobacco. He reports current alcohol use of about 7.0 standard drinks of alcohol per week. He reports that he does not use drugs. family history includes Diabetes in his brother, brother, father, mother, and sister; Heart attack in his brother and father; Hypertension in his brother,  father, mother, and sister; Kidney failure in his brother and brother; Stroke in his mother; Transient ischemic attack in his brother. Allergies  Allergen Reactions   Aspirin     REACTION: hives States tolerates IBU & acetaminophen  OK   Other     Walnuts & pecans cause hives   Codeine     Itching with codeine containing cough syrup 9/15   Pecan Nut (Diagnostic) Hives   Zetia  [Ezetimibe ]     See 09/22/14 musculoskeletal pains   Ezetimibe -Simvastatin Hives    REACTION: myalgia   Current Outpatient Medications on File Prior to Visit  Medication Sig Dispense Refill   amitriptyline  (ELAVIL ) 100 MG tablet Take 1/2-1 tablet (50-100 mg total) by mouth at bedtime as needed for pain and sleep. 90 tablet 1   amLODipine  (NORVASC ) 5 MG tablet Take 1 tablet (5 mg total) by mouth daily. 90 tablet 3   Blood Glucose Monitoring Suppl (ONE TOUCH ULTRA 2) w/Device KIT Use as directed once daily E11.9 1 kit 0   clopidogrel  (PLAVIX ) 75 MG tablet Take 1 tablet (75 mg total) by mouth daily. 90 tablet 3   Coenzyme Q10 (CO Q-10) 100 MG CAPS Take 1 capsule by mouth daily.     dexlansoprazole  (  DEXILANT ) 60 MG capsule Take 1 capsule (60 mg total) by mouth daily. 90 capsule 3   empagliflozin  (JARDIANCE ) 10 MG TABS tablet Take 1 tablet (10 mg total) by mouth daily before breakfast. 90 tablet 3   Epinastine HCl 0.05 % ophthalmic solution Place 1 drop into both eyes 2 (two) times daily. 5 mL 3   ezetimibe  (ZETIA ) 10 MG tablet Take 1 tablet (10 mg total) by mouth daily. 90 tablet 3   fluticasone  (FLONASE ) 50 MCG/ACT nasal spray SPRAY 2 SPRAYS INTO EACH NOSTRIL DAILY 48 mL 3   Lancets MISC Use as directed once daily E11.9 100 each 3   losartan  (COZAAR ) 100 MG tablet Take 1 tablet (100 mg total) by mouth daily. 90 tablet 3   Multiple Vitamin (MULTIVITAMIN) tablet Take 1 tablet by mouth daily.     ONETOUCH ULTRA test strip USE AS INSTRUCTED ONCE DAILY E11.9 100 strip 12   rosuvastatin  (CRESTOR ) 40 MG tablet Take 1 tablet  (40 mg total) by mouth every other day. 45 tablet 3   sildenafil  (VIAGRA ) 100 MG tablet Take 1/2-1 tablet (50-100 mg total) by mouth daily as needed for erectile dysfunction. 10 tablet 11   [DISCONTINUED] Lansoprazole (PREVACID PO) Take by mouth daily.       [DISCONTINUED] omeprazole  (PRILOSEC) 40 MG capsule Take 1 capsule (40 mg total) by mouth daily. 180 capsule 1   No current facility-administered medications on file prior to visit.        ROS:  All others reviewed and negative.  Objective        PE:  BP 122/62   Pulse 78   Temp 98.2 F (36.8 C)   Ht 5' 6 (1.676 m)   Wt 136 lb 3.2 oz (61.8 kg)   SpO2 98%   BMI 21.98 kg/m                 Constitutional: Pt appears in NAD               HENT: Head: NCAT.                Right Ear: External ear with 2+ red, tender, sweling , small discharge                Left Ear: External ear normal.                Eyes: . Pupils are equal, round, and reactive to light. Conjunctivae and EOM are normal               Nose: without d/c or deformity               Neck: Neck supple. Gross normal ROM               Cardiovascular: Normal rate and regular rhythm.                 Pulmonary/Chest: Effort normal and breath sounds without rales or wheezing.                Abd:  Soft, NT, ND, + BS, no organomegaly               Neurological: Pt is alert. At baseline orientation, motor grossly intact               Skin: Skin is warm. No rashes, no other new lesions, LE edema - none  Psychiatric: Pt behavior is normal without agitation   Micro: none  Cardiac tracings I have personally interpreted today:  none  Pertinent Radiological findings (summarize): none   Lab Results  Component Value Date   WBC 4.3 11/26/2023   HGB 13.6 11/26/2023   HCT 42.4 11/26/2023   PLT 163.0 11/26/2023   GLUCOSE 106 (H) 11/26/2023   CHOL 202 (H) 11/26/2023   TRIG 99.0 11/26/2023   HDL 73.40 11/26/2023   LDLDIRECT 150.0 11/18/2019   LDLCALC 109 (H)  11/26/2023   ALT 30 11/26/2023   AST 22 11/26/2023   NA 143 11/26/2023   K 4.1 11/26/2023   CL 107 11/26/2023   CREATININE 1.09 11/26/2023   BUN 17 11/26/2023   CO2 27 11/26/2023   TSH 2.17 11/26/2023   PSA 1.57 06/09/2023   HGBA1C 6.2 11/26/2023   Assessment/Plan:  Kevin Farley is a 68 y.o. Other or two or more races [6] male with  has a past medical history of Allergic rhinitis, Colon polyps, Essential familial hypercholesterolemia (10/01/2023), Family history of ischemic heart disease, GERD (gastroesophageal reflux disease), HTN (hypertension) (04/16/2019), Hyperlipidemia, Other abnormal glucose, and Supraspinatus tendon tear.  HTN (hypertension) BP Readings from Last 3 Encounters:  10/08/23 122/62  08/27/23 118/75  08/14/23 120/82   Stable, pt to continue medical treatment norvasc  5 every day, losartan  100 mg qd   Hyperlipidemia Lab Results  Component Value Date   LDLCALC 109 (H) 08/11/2023   uncontrolled, pt to continue current statin crestor  40 mg every day, and I strongly encouraged pt to add Repatha , but declines for now   Pre-diabetes Lab Results  Component Value Date   HGBA1C 6.2 08/11/2023   Stable, pt to continue current medical treatment jardiance  10 qd   Right otitis externa  Mod to severe, unclear if drops would be effective, now, for antibx course - cortisporin  otic and cipro  50 bid course,,  to f/u any worsening symptoms or concerns   Vitamin D  deficiency Last vitamin D  Lab Results  Component Value Date   VD25OH 51.55 03/11/2023   Stable, cont oral replacement  Followup: Return in about 2 months (around 12/22/2023).  Lynwood Rush, MD 11/26/2023 9:42 PM [facility]

## 2023-10-09 ENCOUNTER — Other Ambulatory Visit (HOSPITAL_COMMUNITY): Payer: Self-pay

## 2023-10-12 ENCOUNTER — Encounter: Payer: Self-pay | Admitting: Internal Medicine

## 2023-10-12 DIAGNOSIS — B369 Superficial mycosis, unspecified: Secondary | ICD-10-CM | POA: Insufficient documentation

## 2023-10-12 DIAGNOSIS — H6091 Unspecified otitis externa, right ear: Secondary | ICD-10-CM | POA: Insufficient documentation

## 2023-10-12 NOTE — Assessment & Plan Note (Signed)
 Lab Results  Component Value Date   LDLCALC 109 (H) 08/11/2023   uncontrolled, pt to continue current statin crestor  40 mg every day, and I strongly encouraged pt to add Repatha , but declines for now

## 2023-10-12 NOTE — Assessment & Plan Note (Signed)
 Mod to severe, unclear if drops would be effective, now, for antibx course - cortisporin  otic and cipro  50 bid course,,  to f/u any worsening symptoms or concerns

## 2023-10-12 NOTE — Assessment & Plan Note (Signed)
 Last vitamin D Lab Results  Component Value Date   VD25OH 51.55 03/11/2023   Stable, cont oral replacement

## 2023-10-12 NOTE — Assessment & Plan Note (Signed)
 Lab Results  Component Value Date   HGBA1C 6.2 08/11/2023   Stable, pt to continue current medical treatment jardiance  10 qd

## 2023-10-12 NOTE — Assessment & Plan Note (Signed)
 BP Readings from Last 3 Encounters:  10/08/23 122/62  08/27/23 118/75  08/14/23 120/82   Stable, pt to continue medical treatment norvasc  5 every day, losartan  100 mg qd

## 2023-10-13 ENCOUNTER — Other Ambulatory Visit (HOSPITAL_COMMUNITY): Payer: Self-pay

## 2023-10-15 DIAGNOSIS — H6241 Otitis externa in other diseases classified elsewhere, right ear: Secondary | ICD-10-CM | POA: Diagnosis not present

## 2023-10-15 DIAGNOSIS — H6121 Impacted cerumen, right ear: Secondary | ICD-10-CM | POA: Diagnosis not present

## 2023-10-15 DIAGNOSIS — B369 Superficial mycosis, unspecified: Secondary | ICD-10-CM | POA: Diagnosis not present

## 2023-10-15 DIAGNOSIS — H66011 Acute suppurative otitis media with spontaneous rupture of ear drum, right ear: Secondary | ICD-10-CM | POA: Diagnosis not present

## 2023-10-17 ENCOUNTER — Encounter: Payer: Self-pay | Admitting: Neurology

## 2023-10-21 ENCOUNTER — Other Ambulatory Visit (HOSPITAL_COMMUNITY): Payer: Self-pay

## 2023-10-23 ENCOUNTER — Other Ambulatory Visit (HOSPITAL_COMMUNITY): Payer: Self-pay

## 2023-10-23 DIAGNOSIS — H6121 Impacted cerumen, right ear: Secondary | ICD-10-CM | POA: Diagnosis not present

## 2023-10-23 DIAGNOSIS — H6240 Otitis externa in other diseases classified elsewhere, unspecified ear: Secondary | ICD-10-CM | POA: Diagnosis not present

## 2023-10-23 DIAGNOSIS — H66011 Acute suppurative otitis media with spontaneous rupture of ear drum, right ear: Secondary | ICD-10-CM | POA: Diagnosis not present

## 2023-10-23 DIAGNOSIS — B369 Superficial mycosis, unspecified: Secondary | ICD-10-CM | POA: Diagnosis not present

## 2023-10-24 ENCOUNTER — Other Ambulatory Visit (HOSPITAL_COMMUNITY): Payer: Self-pay

## 2023-10-28 ENCOUNTER — Other Ambulatory Visit (HOSPITAL_COMMUNITY): Payer: Self-pay

## 2023-10-28 ENCOUNTER — Other Ambulatory Visit: Payer: Self-pay

## 2023-10-30 DIAGNOSIS — H9191 Unspecified hearing loss, right ear: Secondary | ICD-10-CM | POA: Diagnosis not present

## 2023-11-06 ENCOUNTER — Other Ambulatory Visit (HOSPITAL_COMMUNITY): Payer: Self-pay

## 2023-11-10 ENCOUNTER — Other Ambulatory Visit (HOSPITAL_COMMUNITY): Payer: Self-pay

## 2023-11-11 ENCOUNTER — Other Ambulatory Visit: Payer: Self-pay

## 2023-11-12 ENCOUNTER — Ambulatory Visit: Payer: Self-pay

## 2023-11-12 NOTE — Telephone Encounter (Signed)
 FYI Only or Action Required?: FYI only for provider.  Patient was last seen in primary care on 10/08/2023 by Norleen Lynwood ORN, MD.  Called Nurse Triage reporting Pain.  Symptoms began x 6 weeks.  Interventions attempted: Nothing.  Symptoms are: unchanged.  Triage Disposition: See Physician Within 24 Hours  Patient/caregiver understands and will follow disposition?: Yes   Copied from CRM #[id number]. Topic: Clinical - Red Word Triage >> Nov 12, 2023  1:46 PM Franky GRADE wrote: Red Word that prompted transfer to Nurse Triage: Patient's spouse is calling because the patient has been experiencing ear & feet pain for about a month now. Patient was seen on 10/08/2023 for the ear pain but no improvement. Reason for Disposition  [1] MILD pain (e.g., does not interfere with normal activities) AND [2] present > 7 days  White, yellow, or green discharge (pus)  Answer Assessment - Initial Assessment Questions 1. LOCATION: Which ear is involved?     Bilateral ear 2. ONSET: When did the ear pain start?      X 6 weeks 3. SEVERITY: How bad is the pain?  (Scale 1-10; mild, moderate or severe)     moderate 4. URI SYMPTOMS: Do you have a runny nose or cough?     no 5. FEVER: Do you have a fever? If Yes, ask: What is your temperature, how was it measured, and when did it start?     no 6. CAUSE: Have you been swimming recently?, How often do you use Q-TIPS?, Have you had any recent air travel or scuba diving?     na 7. OTHER SYMPTOMS: Do you have any other symptoms? (e.g., decreased hearing, dizziness, headache, stiff neck, vomiting)     Decreased bilateral ear+ 8. PREGNANCY: Is there any chance you are pregnant? When was your last menstrual period?     na Offered pt's wife to schedule an appt with a different provider: she refused and stated he only wants to see Dr. Norleen: scheduled pt on next available opening 11/26/2023 Has been to ear specialist: was treated but still having  issues  Answer Assessment - Initial Assessment Questions 1. ONSET: When did the pain start?      X several months 2. LOCATION: Where is the pain located?      Bilateral  3. PAIN: How bad is the pain?    (Scale 1-10; or mild, moderate, severe)     moderate 4. WORK OR EXERCISE: Has there been any recent work or exercise that involved this part of the body?      no 5. CAUSE: What do you think is causing the leg pain?     unknown 6. OTHER SYMPTOMS: Do you have any other symptoms? (e.g., chest pain, back pain, breathing difficulty, swelling, rash, fever, numbness, weakness)     Tingling 7. PREGNANCY: Is there any chance you are pregnant? When was your last menstrual period?     na  Protocols used: Earache-A-AH, Leg Pain-A-AH

## 2023-11-19 ENCOUNTER — Other Ambulatory Visit (HOSPITAL_COMMUNITY): Payer: Self-pay

## 2023-11-20 ENCOUNTER — Other Ambulatory Visit (HOSPITAL_COMMUNITY): Payer: Self-pay

## 2023-11-26 ENCOUNTER — Encounter: Payer: Self-pay | Admitting: Internal Medicine

## 2023-11-26 ENCOUNTER — Other Ambulatory Visit (HOSPITAL_COMMUNITY): Payer: Self-pay

## 2023-11-26 ENCOUNTER — Ambulatory Visit: Payer: Self-pay | Admitting: Internal Medicine

## 2023-11-26 ENCOUNTER — Ambulatory Visit (INDEPENDENT_AMBULATORY_CARE_PROVIDER_SITE_OTHER): Admitting: Internal Medicine

## 2023-11-26 VITALS — BP 128/82 | HR 70 | Temp 98.6°F | Ht 66.0 in | Wt 134.0 lb

## 2023-11-26 DIAGNOSIS — E559 Vitamin D deficiency, unspecified: Secondary | ICD-10-CM | POA: Diagnosis not present

## 2023-11-26 DIAGNOSIS — H60501 Unspecified acute noninfective otitis externa, right ear: Secondary | ICD-10-CM

## 2023-11-26 DIAGNOSIS — E611 Iron deficiency: Secondary | ICD-10-CM

## 2023-11-26 DIAGNOSIS — Z201 Contact with and (suspected) exposure to tuberculosis: Secondary | ICD-10-CM

## 2023-11-26 DIAGNOSIS — Z23 Encounter for immunization: Secondary | ICD-10-CM

## 2023-11-26 DIAGNOSIS — R7303 Prediabetes: Secondary | ICD-10-CM | POA: Diagnosis not present

## 2023-11-26 DIAGNOSIS — R413 Other amnesia: Secondary | ICD-10-CM | POA: Diagnosis not present

## 2023-11-26 DIAGNOSIS — E538 Deficiency of other specified B group vitamins: Secondary | ICD-10-CM | POA: Diagnosis not present

## 2023-11-26 DIAGNOSIS — R202 Paresthesia of skin: Secondary | ICD-10-CM | POA: Insufficient documentation

## 2023-11-26 DIAGNOSIS — Z566 Other physical and mental strain related to work: Secondary | ICD-10-CM | POA: Insufficient documentation

## 2023-11-26 DIAGNOSIS — E782 Mixed hyperlipidemia: Secondary | ICD-10-CM

## 2023-11-26 LAB — BASIC METABOLIC PANEL WITH GFR
BUN: 17 mg/dL (ref 6–23)
CO2: 27 meq/L (ref 19–32)
Calcium: 8.9 mg/dL (ref 8.4–10.5)
Chloride: 107 meq/L (ref 96–112)
Creatinine, Ser: 1.09 mg/dL (ref 0.40–1.50)
GFR: 70.51 mL/min (ref >60.00)
Glucose, Bld: 106 mg/dL — ABNORMAL HIGH (ref 70–99)
Potassium: 4.1 meq/L (ref 3.5–5.1)
Sodium: 143 meq/L (ref 135–145)

## 2023-11-26 LAB — IBC PANEL
Iron: 124 ug/dL (ref 42–165)
Saturation Ratios: 37.7 % (ref 20.0–50.0)
TIBC: 329 ug/dL (ref 250.0–450.0)
Transferrin: 235 mg/dL (ref 212.0–360.0)

## 2023-11-26 LAB — CBC WITH DIFFERENTIAL/PLATELET
Basophils Absolute: 0 K/uL (ref 0.0–0.1)
Basophils Relative: 0.8 % (ref 0.0–3.0)
Eosinophils Absolute: 0.1 K/uL (ref 0.0–0.7)
Eosinophils Relative: 1.5 % (ref 0.0–5.0)
HCT: 42.4 % (ref 39.0–52.0)
Hemoglobin: 13.6 g/dL (ref 13.0–17.0)
Lymphocytes Relative: 23 % (ref 12.0–46.0)
Lymphs Abs: 1 K/uL (ref 0.7–4.0)
MCHC: 32.2 g/dL (ref 30.0–36.0)
MCV: 85 fl (ref 78.0–100.0)
Monocytes Absolute: 0.4 K/uL (ref 0.1–1.0)
Monocytes Relative: 10.1 % (ref 3.0–12.0)
Neutro Abs: 2.8 K/uL (ref 1.4–7.7)
Neutrophils Relative %: 64.6 % (ref 43.0–77.0)
Platelets: 163 K/uL (ref 150.0–400.0)
RBC: 4.99 Mil/uL (ref 4.22–5.81)
RDW: 13.6 % (ref 11.5–15.5)
WBC: 4.3 K/uL (ref 4.0–10.5)

## 2023-11-26 LAB — HEPATIC FUNCTION PANEL
ALT: 30 U/L (ref 0–53)
AST: 22 U/L (ref 0–37)
Albumin: 4.6 g/dL (ref 3.5–5.2)
Alkaline Phosphatase: 42 U/L (ref 39–117)
Bilirubin, Direct: 0.2 mg/dL (ref 0.0–0.3)
Total Bilirubin: 0.6 mg/dL (ref 0.2–1.2)
Total Protein: 7.3 g/dL (ref 6.0–8.3)

## 2023-11-26 LAB — LIPID PANEL
Cholesterol: 202 mg/dL — ABNORMAL HIGH (ref 0–200)
HDL: 73.4 mg/dL (ref >39.00)
LDL Cholesterol: 109 mg/dL — ABNORMAL HIGH (ref 0–99)
NonHDL: 128.84
Total CHOL/HDL Ratio: 3
Triglycerides: 99 mg/dL (ref 0.0–149.0)
VLDL: 19.8 mg/dL (ref 0.0–40.0)

## 2023-11-26 LAB — URINALYSIS, ROUTINE W REFLEX MICROSCOPIC
Bilirubin Urine: NEGATIVE
Hgb urine dipstick: NEGATIVE
Ketones, ur: NEGATIVE
Leukocytes,Ua: NEGATIVE
Nitrite: NEGATIVE
Specific Gravity, Urine: 1.025 (ref 1.000–1.030)
Total Protein, Urine: NEGATIVE
Urine Glucose: 500 — AB
Urobilinogen, UA: 0.2 (ref 0.0–1.0)
pH: 6 (ref 5.0–8.0)

## 2023-11-26 LAB — TSH: TSH: 2.17 u[IU]/mL (ref 0.35–5.50)

## 2023-11-26 LAB — VITAMIN D 25 HYDROXY (VIT D DEFICIENCY, FRACTURES): VITD: 38.21 ng/mL (ref 30.00–100.00)

## 2023-11-26 LAB — HEMOGLOBIN A1C: Hgb A1c MFr Bld: 6.2 % (ref 4.6–6.5)

## 2023-11-26 LAB — FERRITIN: Ferritin: 62.4 ng/mL (ref 22.0–322.0)

## 2023-11-26 LAB — VITAMIN B12: Vitamin B-12: 720 pg/mL (ref 211–911)

## 2023-11-26 MED ORDER — GABAPENTIN 100 MG PO CAPS
ORAL_CAPSULE | ORAL | 5 refills | Status: AC
  Filled 2023-11-26: qty 120, 30d supply, fill #0

## 2023-11-26 MED ORDER — FLUCONAZOLE 100 MG PO TABS
100.0000 mg | ORAL_TABLET | Freq: Every day | ORAL | 0 refills | Status: DC
  Filled 2023-11-26: qty 7, 7d supply, fill #0

## 2023-11-26 MED ORDER — REPATHA SURECLICK 140 MG/ML ~~LOC~~ SOAJ
140.0000 mg | SUBCUTANEOUS | 3 refills | Status: DC
  Filled 2023-11-26: qty 6, 84d supply, fill #0

## 2023-11-26 NOTE — Assessment & Plan Note (Signed)
 Pt with mild worsening symptoms, ok for increased gabapentin  100 bid and 200 at bedtime, and for neurology as above

## 2023-11-26 NOTE — Assessment & Plan Note (Signed)
 Pt owns 9 hotel and other assets, not tolerating stress as well as before, declines celexa, but will be ok with referral counseling

## 2023-11-26 NOTE — Assessment & Plan Note (Signed)
 Recent onset, unclear how might be mood affected, for brain MRI and refer neurology per pt request

## 2023-11-26 NOTE — Assessment & Plan Note (Signed)
 Mild to mod, drops would not be feasible, pt afeb but has probable fungal infection, unable to be seen per ENT to oct 23, pt asking for tx - for diflucan 100 every day  x 7 days,  to f/u ENT and any worsening symptoms or concerns

## 2023-11-26 NOTE — Patient Instructions (Addendum)
 Please take all new medication as prescribed - the diflucan for the right ear  Ok to increase the gabapentin  starting at 100 mg twice per day, and 200 mg at bedtime  Please take all new medication as prescribed- the repatha  twice per month  Please continue all other medications as before, and refills have been done if requested.  Please have the pharmacy call with any other refills you may need.  Please continue your efforts at being more active, low cholesterol diet, and weight control.  Please keep your appointments with your specialists as you may have planned - ENT oct 23  You will be contacted regarding the referral for: MRI brain, and Neurology ([facility]) for memory change and leg paresthesias  You will be contacted regarding the referral for: Counseling related to work stress   Please let us  know if you would change your mind about starting Celexa 10 mg per day for stress  Please go to the LAB at the blood drawing area for the tests to be done, including the GOLD test for TB  You will be contacted by phone if any changes need to be made immediately.  Otherwise, you will receive a letter about your results with an explanation, but please check with MyChart first.  Please make an Appointment to return in 4 months, or sooner if needed  Ok to cancel the nov appt unless you really want to keep it.

## 2023-11-26 NOTE — Assessment & Plan Note (Signed)
 Last vitamin D  Lab Results  Component Value Date   VD25OH 38.21 11/26/2023   Low, to start oral replacement

## 2023-11-26 NOTE — Progress Notes (Signed)
 Patient ID: Kevin Farley, male   DOB: 11-Nov-1955, 68 y.o.   MRN: [id number]        Chief Complaint: follow up chronic right ext otitis flare, need for repeat Gold testing per health dept, bilat leg paresthesias, memory change with irritability mood changes, hld       HPI:  Kevin Farley is a 68 y.o. male here with 3 days onset right ear pain, has been treated with recent , tx with cipro  course and attempted cortisporin  otic but this did not work well, seemed better now worsening again with pain, slight d/c.  Had ENT evaluation with finding of fungal involvement and asked to keep ear canal dry.  Has f/u ENT appt for oct 23 but could not wait to then.  Also has mild worsening bilat distal leg paresthesias, has seen neurology with reported neg EMG NCS, but gabapentin  100 mg is what works at bedtime, and needs better daytime control.   Needs repeat GOLD TB testing per health dept who require 2 testing to prove negative.  Pt has been statin intolerant, and asking for different tx such as repatha .  Also here with wife who reports more stress related to work and less able to tolerate this lately with mood swings, irritability, slammed door.  Willing for counseling, but declines other such as celexa .  For flu shot today       Wt Readings from Last 3 Encounters:  11/26/23 134 lb (60.8 kg)  10/08/23 136 lb 3.2 oz (61.8 kg)  08/27/23 134 lb (60.8 kg)   BP Readings from Last 3 Encounters:  11/26/23 128/82  10/08/23 122/62  08/27/23 118/75         Past Medical History:  Diagnosis Date   Allergic rhinitis    Colon polyps    adenomatous 2011 & 2014   Essential familial hypercholesterolemia 10/01/2023   Family history of ischemic heart disease    GERD (gastroesophageal reflux disease)    HTN (hypertension) 04/16/2019   Hyperlipidemia    Other abnormal glucose    A1c 6% in 06/2009   Supraspinatus tendon tear    Past Surgical History:  Procedure Laterality Date   BACK SURGERY  02/12/2004   Dr Gaither, [place]    cardic catherization  02/12/2003   negative; Dr Lorn   colon polyps  2011 & 2014   Dr.Jacobs    COLONOSCOPY     CORNEAL TRANSPLANT Left 02/12/1991   Dr Rosan   nasal bone surgery  02/11/2005   UPPER GASTROINTESTINAL ENDOSCOPY  02/11/2009   mild gastritis; Dr Teressa    reports that he has never smoked. He has never used smokeless tobacco. He reports current alcohol use of about 7.0 standard drinks of alcohol per week. He reports that he does not use drugs. family history includes Diabetes in his brother, brother, father, mother, and sister; Heart attack in his brother and father; Hypertension in his brother, father, mother, and sister; Kidney failure in his brother and brother; Stroke in his mother; Transient ischemic attack in his brother. Allergies  Allergen Reactions   Aspirin     REACTION: hives States tolerates IBU & acetaminophen  OK   Other     Walnuts & pecans cause hives   Codeine     Itching with codeine containing cough syrup 9/15   Pecan Nut (Diagnostic) Hives   Zetia  [Ezetimibe ]     See 09/22/14 musculoskeletal pains   Ezetimibe -Simvastatin Hives    REACTION: myalgia   Current Outpatient  Medications on File Prior to Visit  Medication Sig Dispense Refill   amitriptyline  (ELAVIL ) 100 MG tablet Take 1/2-1 tablet (50-100 mg total) by mouth at bedtime as needed for pain and sleep. 90 tablet 1   amLODipine  (NORVASC ) 5 MG tablet Take 1 tablet (5 mg total) by mouth daily. 90 tablet 3   Blood Glucose Monitoring Suppl (ONE TOUCH ULTRA 2) w/Device KIT Use as directed once daily E11.9 1 kit 0   clopidogrel  (PLAVIX ) 75 MG tablet Take 1 tablet (75 mg total) by mouth daily. 90 tablet 3   Coenzyme Q10 (CO Q-10) 100 MG CAPS Take 1 capsule by mouth daily.     dexlansoprazole  (DEXILANT ) 60 MG capsule Take 1 capsule (60 mg total) by mouth daily. 90 capsule 3   empagliflozin  (JARDIANCE ) 10 MG TABS tablet Take 1 tablet (10 mg total) by mouth daily before breakfast. 90 tablet 3    Epinastine HCl 0.05 % ophthalmic solution Place 1 drop into both eyes 2 (two) times daily. 5 mL 3   ezetimibe  (ZETIA ) 10 MG tablet Take 1 tablet (10 mg total) by mouth daily. 90 tablet 3   Flaxseed, Linseed, (FLAXSEED OIL PO) Take 1 tablet by mouth daily.     fluticasone  (FLONASE ) 50 MCG/ACT nasal spray SPRAY 2 SPRAYS INTO EACH NOSTRIL DAILY 48 mL 3   Lancets MISC Use as directed once daily E11.9 100 each 3   losartan  (COZAAR ) 100 MG tablet Take 1 tablet (100 mg total) by mouth daily. 90 tablet 3   Multiple Vitamin (MULTIVITAMIN) tablet Take 1 tablet by mouth daily.     ONETOUCH ULTRA test strip USE AS INSTRUCTED ONCE DAILY E11.9 100 strip 12   rosuvastatin  (CRESTOR ) 40 MG tablet Take 1 tablet (40 mg total) by mouth every other day. 45 tablet 3   sildenafil  (VIAGRA ) 100 MG tablet Take 1/2-1 tablet (50-100 mg total) by mouth daily as needed for erectile dysfunction. 10 tablet 11   [DISCONTINUED] Lansoprazole (PREVACID PO) Take by mouth daily.       [DISCONTINUED] omeprazole  (PRILOSEC) 40 MG capsule Take 1 capsule (40 mg total) by mouth daily. 180 capsule 1   No current facility-administered medications on file prior to visit.        ROS:  All others reviewed and negative.  Objective        PE:  BP 128/82 (BP Location: Right Arm, Patient Position: Sitting, Cuff Size: Normal)   Pulse 70   Temp 98.6 F (37 C) (Oral)   Ht 5' 6 (1.676 m)   Wt 134 lb (60.8 kg)   SpO2 98%   BMI 21.63 kg/m                 Constitutional: Pt appears in NAD               HENT: Head: NCAT.                Right Ear: External ear normal.  Right canal with mild erythema swelling to post wall but also moderate mucoid probable fungal mass it seems, no overt d/c or bleeding, TM intact               Left Ear: External ear normal.                Eyes: . Pupils are equal, round, and reactive to light. Conjunctivae and EOM are normal               Nose: without  d/c or deformity               Neck: Neck supple. Gross  normal ROM               Cardiovascular: Normal rate and regular rhythm.                 Pulmonary/Chest: Effort normal and breath sounds without rales or wheezing.                Abd:  Soft, NT, ND, + BS, no organomegaly               Neurological: Pt is alert. At baseline orientation, motor grossly intact               Skin: Skin is warm. No rashes, no other new lesions, LE edema - none               Psychiatric: Pt behavior is normal without agitation   Micro: none  Cardiac tracings I have personally interpreted today:  none  Pertinent Radiological findings (summarize): none   Lab Results  Component Value Date   WBC 4.3 11/26/2023   HGB 13.6 11/26/2023   HCT 42.4 11/26/2023   PLT 163.0 11/26/2023   GLUCOSE 106 (H) 11/26/2023   CHOL 202 (H) 11/26/2023   TRIG 99.0 11/26/2023   HDL 73.40 11/26/2023   LDLDIRECT 150.0 11/18/2019   LDLCALC 109 (H) 11/26/2023   ALT 30 11/26/2023   AST 22 11/26/2023   NA 143 11/26/2023   K 4.1 11/26/2023   CL 107 11/26/2023   CREATININE 1.09 11/26/2023   BUN 17 11/26/2023   CO2 27 11/26/2023   TSH 2.17 11/26/2023   PSA 1.57 06/09/2023   HGBA1C 6.2 11/26/2023   Assessment/Plan:  DARLY FAILS is a 68 y.o. Other or two or more races [6] male with  has a past medical history of Allergic rhinitis, Colon polyps, Essential familial hypercholesterolemia (10/01/2023), Family history of ischemic heart disease, GERD (gastroesophageal reflux disease), HTN (hypertension) (04/16/2019), Hyperlipidemia, Other abnormal glucose, and Supraspinatus tendon tear.  Vitamin D  deficiency Last vitamin D  Lab Results  Component Value Date   VD25OH 38.21 11/26/2023   Low, to start oral replacement   Right otitis externa Mild to mod, drops would not be feasible, pt afeb but has probable fungal infection, unable to be seen per ENT to oct 23, pt asking for tx - for diflucan 100 every day  x 7 days,  to f/u ENT and any worsening symptoms or  concerns   Pre-diabetes Lab Results  Component Value Date   HGBA1C 6.2 11/26/2023   Stable, pt to continue current medical treatment jardiance  10 mg qd   Hyperlipidemia Lab Results  Component Value Date   LDLCALC 109 (H) 11/26/2023   Uncontrolled, declines statin but for start repatha  140 mg Im biweekly   Memory changes Recent onset, unclear how might be mood affected, for brain MRI and refer neurology per pt request  Paresthesia of bilateral legs Pt with mild worsening symptoms, ok for increased gabapentin  100 bid and 200 at bedtime, and for neurology as above  Work stress Pt owns 9 hotel and other assets, not tolerating stress as well as before, declines celexa, but will be ok with referral counseling  Followup: Return in about 4 months (around 03/28/2024).  Lynwood Rush, MD 11/26/2023 9:39 PM [facility] [place] [facility]

## 2023-11-26 NOTE — Assessment & Plan Note (Signed)
 Lab Results  Component Value Date   HGBA1C 6.2 11/26/2023   Stable, pt to continue current medical treatment jardiance  10 mg qd

## 2023-11-26 NOTE — Addendum Note (Signed)
 Addended by: NORLEEN LYNWOOD ORN on: 11/26/2023 09:43 PM   Modules accepted: Level of Service

## 2023-11-26 NOTE — Assessment & Plan Note (Signed)
 Lab Results  Component Value Date   LDLCALC 109 (H) 11/26/2023   Uncontrolled, declines statin but for start repatha  140 mg Im biweekly

## 2023-11-28 LAB — QUANTIFERON-TB GOLD PLUS
Mitogen-NIL: 5.9 [IU]/mL
NIL: 0.01 [IU]/mL
QuantiFERON-TB Gold Plus: NEGATIVE
TB1-NIL: 0.01 [IU]/mL
TB2-NIL: 0.01 [IU]/mL

## 2023-12-17 ENCOUNTER — Ambulatory Visit
Admission: RE | Admit: 2023-12-17 | Discharge: 2023-12-17 | Disposition: A | Discharge status: Home / Self Care | Source: Ambulatory Visit | Attending: Internal Medicine | Admitting: Internal Medicine

## 2023-12-17 DIAGNOSIS — R413 Other amnesia: Secondary | ICD-10-CM

## 2023-12-17 DIAGNOSIS — R202 Paresthesia of skin: Secondary | ICD-10-CM

## 2023-12-17 LAB — OPHTHALMOLOGY REPORT-SCANNED

## 2023-12-18 ENCOUNTER — Ambulatory Visit: Admitting: Internal Medicine

## 2023-12-23 ENCOUNTER — Other Ambulatory Visit (HOSPITAL_COMMUNITY): Payer: Self-pay

## 2023-12-23 ENCOUNTER — Other Ambulatory Visit: Payer: Self-pay

## 2023-12-23 ENCOUNTER — Other Ambulatory Visit: Payer: Self-pay | Admitting: Internal Medicine

## 2023-12-23 MED ORDER — EZETIMIBE 10 MG PO TABS
10.0000 mg | ORAL_TABLET | Freq: Every day | ORAL | 3 refills | Status: DC
  Filled 2023-12-23 – 2024-01-16 (×4): qty 90, 90d supply, fill #0

## 2023-12-29 ENCOUNTER — Other Ambulatory Visit (HOSPITAL_COMMUNITY): Payer: Self-pay

## 2024-01-02 ENCOUNTER — Other Ambulatory Visit (HOSPITAL_COMMUNITY): Payer: Self-pay

## 2024-01-05 ENCOUNTER — Other Ambulatory Visit (HOSPITAL_COMMUNITY): Payer: Self-pay

## 2024-01-12 ENCOUNTER — Other Ambulatory Visit (HOSPITAL_COMMUNITY): Payer: Self-pay

## 2024-01-15 ENCOUNTER — Other Ambulatory Visit (HOSPITAL_COMMUNITY): Payer: Self-pay

## 2024-01-16 ENCOUNTER — Other Ambulatory Visit: Payer: Self-pay

## 2024-01-16 ENCOUNTER — Other Ambulatory Visit (HOSPITAL_COMMUNITY): Payer: Self-pay

## 2024-01-27 ENCOUNTER — Other Ambulatory Visit: Payer: Self-pay | Admitting: Internal Medicine

## 2024-01-27 ENCOUNTER — Other Ambulatory Visit: Payer: Self-pay

## 2024-01-27 ENCOUNTER — Other Ambulatory Visit (HOSPITAL_COMMUNITY): Payer: Self-pay

## 2024-01-27 MED ORDER — LOSARTAN POTASSIUM 100 MG PO TABS
100.0000 mg | ORAL_TABLET | Freq: Every day | ORAL | 3 refills | Status: AC
  Filled 2024-01-27: qty 90, 90d supply, fill #0

## 2024-02-13 ENCOUNTER — Other Ambulatory Visit (HOSPITAL_COMMUNITY): Payer: Self-pay

## 2024-02-25 ENCOUNTER — Other Ambulatory Visit (HOSPITAL_BASED_OUTPATIENT_CLINIC_OR_DEPARTMENT_OTHER): Payer: Self-pay

## 2024-03-05 ENCOUNTER — Ambulatory Visit: Payer: Medicare Other

## 2024-03-05 ENCOUNTER — Telehealth: Payer: Self-pay

## 2024-03-05 VITALS — BP 140/82 | HR 75 | Ht 67.0 in | Wt 131.4 lb

## 2024-03-05 DIAGNOSIS — I10 Essential (primary) hypertension: Secondary | ICD-10-CM

## 2024-03-05 DIAGNOSIS — R7303 Prediabetes: Secondary | ICD-10-CM

## 2024-03-05 DIAGNOSIS — Z0000 Encounter for general adult medical examination without abnormal findings: Secondary | ICD-10-CM | POA: Diagnosis not present

## 2024-03-05 NOTE — Patient Instructions (Addendum)
 [name],  Thank you for taking the time for your Medicare Wellness Visit. I appreciate your continued commitment to your health goals. Please review the care plan we discussed, and feel free to reach out if I can assist you further.  Please note that Annual Wellness Visits do not include a physical exam. Some assessments may be limited, especially if the visit was conducted virtually. If needed, we may recommend an in-person follow-up with your provider.  Ongoing Care Seeing your primary care provider every 3 to 6 months helps us  monitor your health and provide consistent, personalized care. Last office visit on 11/26/2023.  You are due for a tetanus vaccine and a Shingles vaccine.  These can be given at your local pharmacy.  Please contact office to get scheduled for a follow up visit soon.  Referrals If a referral was made during today's visit and you haven't received any updates within two weeks, please contact the referred provider directly to check on the status.  Recommended Screenings:  Health Maintenance  Topic Date Due   Zoster (Shingles) Vaccine (1 of 2) Never done   DTaP/Tdap/Td vaccine (2 - Td or Tdap) 10/18/2020   Medicare Annual Wellness Visit  03/04/2024   Colon Cancer Screening  04/16/2025   Pneumococcal Vaccine for age over 14  Completed   Flu Shot  Completed   Hepatitis C Screening  Completed   Meningitis B Vaccine  Aged Out   COVID-19 Vaccine  Discontinued       03/05/2024    7:19 AM  Advanced Directives  Does Patient Have a Medical Advance Directive? No    Vision: Annual vision screenings are recommended for early detection of glaucoma, cataracts, and diabetic retinopathy. These exams can also reveal signs of chronic conditions such as diabetes and high blood pressure.  Dental: Annual dental screenings help detect early signs of oral cancer, gum disease, and other conditions linked to overall health, including heart disease and diabetes.  Please see the  attached documents for additional preventive care recommendations.

## 2024-03-05 NOTE — Telephone Encounter (Signed)
 Ok I have ordered the labs  thanks

## 2024-03-05 NOTE — Progress Notes (Addendum)
 "  Chief Complaint  Patient presents with   Medicare Wellness     Subjective:   Kevin Farley is a 69 y.o. male who presents for a Medicare Annual Wellness Visit.  Visit info / Clinical Intake: Medicare Wellness Visit Type:: Subsequent Annual Wellness Visit Persons participating in visit and providing information:: patient Medicare Wellness Visit Mode:: In-person (required for WTM) Interpreter Needed?: No Pre-visit prep was completed: yes AWV questionnaire completed by patient prior to visit?: yes Date:: 03/05/24 Living arrangements:: (Patient-Rptd) lives with spouse/significant other; with family/others Patient's Overall Health Status Rating: (Patient-Rptd) good Typical amount of pain: (Patient-Rptd) some Does pain affect daily life?: (!) (Patient-Rptd) yes Are you currently prescribed opioids?: (!) yes  Dietary Habits and Nutritional Risks How many meals a day?: (Patient-Rptd) 3 Eats fruit and vegetables daily?: (Patient-Rptd) yes Most meals are obtained by: (Patient-Rptd) preparing own meals In the last 2 weeks, have you had any of the following?: none Diabetic:: no  Functional Status Activities of Daily Living (to include ambulation/medication): (Patient-Rptd) Independent Ambulation: Independent with device- listed below Home Assistive Devices/Equipment: Eyeglasses Medication Administration: (Patient-Rptd) Independent Home Management (perform basic housework or laundry): (Patient-Rptd) Independent Manage your own finances?: (Patient-Rptd) yes Primary transportation is: (Patient-Rptd) driving Concerns about vision?: no *vision screening is required for WTM* Concerns about hearing?: (!) yes (ear itchiness/sees a provider for it) Uses hearing aids?: no Hear whispered voice?: yes  Fall Screening Falls in the past year?: (Patient-Rptd) 0 Number of falls in past year: 0 Was there an injury with Fall?: 0 Fall Risk Category Calculator: 0 Patient Fall Risk Level: Low Fall  Risk  Fall Risk Patient at Risk for Falls Due to: No Fall Risks Fall risk Follow up: Falls evaluation completed; Falls prevention discussed  Home and Transportation Safety: All rugs have non-skid backing?: (Patient-Rptd) yes All stairs or steps have railings?: (Patient-Rptd) yes Grab bars in the bathtub or shower?: (Patient-Rptd) yes Have non-skid surface in bathtub or shower?: (Patient-Rptd) yes Good home lighting?: (Patient-Rptd) yes Regular seat belt use?: (Patient-Rptd) yes Hospital stays in the last year:: (Patient-Rptd) no  Cognitive Assessment Difficulty concentrating, remembering, or making decisions? : (Patient-Rptd) no Will 6CIT or Mini Cog be Completed: yes 6CIT or Mini Cog Declined: patient alert, oriented, able to answer questions appropriately and recall recent events What year is it?: 0 points What month is it?: 0 points Give patient an address phrase to remember (5 components): [address], [place], [place] About what time is it?: 0 points Count backwards from 20 to 1: 0 points Say the months of the year in reverse: 0 points Repeat the address phrase from earlier: 0 points 6 CIT Score: 0 points  Advance Directives (For Healthcare) Does Patient Have a Medical Advance Directive?: No Would patient like information on creating a medical advance directive?: No - Patient declined  Reviewed/Updated  Reviewed/Updated: Reviewed All (Medical, Surgical, Family, Medications, Allergies, Care Teams, Patient Goals)    Allergies (verified) Aspirin, Other, Codeine, Pecan nut (diagnostic), Zetia  [ezetimibe ], and Ezetimibe -simvastatin   Current Medications (verified) Outpatient Encounter Medications as of 03/05/2024  Medication Sig   amitriptyline  (ELAVIL ) 100 MG tablet Take 1/2-1 tablet (50-100 mg total) by mouth at bedtime as needed for pain and sleep.   amLODipine  (NORVASC ) 5 MG tablet Take 1 tablet (5 mg total) by mouth daily.   Blood Glucose Monitoring Suppl (ONE TOUCH ULTRA  2) w/Device KIT Use as directed once daily E11.9   clopidogrel  (PLAVIX ) 75 MG tablet Take 1 tablet (75 mg  total) by mouth daily.   Coenzyme Q10 (CO Q-10) 100 MG CAPS Take 1 capsule by mouth daily.   dexlansoprazole  (DEXILANT ) 60 MG capsule Take 1 capsule (60 mg total) by mouth daily.   empagliflozin  (JARDIANCE ) 10 MG TABS tablet Take 1 tablet (10 mg total) by mouth daily before breakfast.   Epinastine HCl 0.05 % ophthalmic solution Place 1 drop into both eyes 2 (two) times daily.   Evolocumab  (REPATHA  SURECLICK) 140 MG/ML SOAJ Inject 140 mg into the skin every 14 (fourteen) days.   ezetimibe  (ZETIA ) 10 MG tablet Take 1 tablet (10 mg total) by mouth daily.   Flaxseed, Linseed, (FLAXSEED OIL PO) Take 1 tablet by mouth daily.   fluconazole  (DIFLUCAN ) 100 MG tablet Take 1 tablet (100 mg total) by mouth daily.   fluticasone  (FLONASE ) 50 MCG/ACT nasal spray SPRAY 2 SPRAYS INTO EACH NOSTRIL DAILY   gabapentin  (NEURONTIN ) 100 MG capsule Take 1 capsule (100 mg total) by mouth 2 (two) times daily. May also take 2 capsules (200 mg total) at bedtime as needed.   Lancets MISC Use as directed once daily E11.9   losartan  (COZAAR ) 100 MG tablet Take 1 tablet (100 mg total) by mouth daily.   Multiple Vitamin (MULTIVITAMIN) tablet Take 1 tablet by mouth daily.   ONETOUCH ULTRA test strip USE AS INSTRUCTED ONCE DAILY E11.9   rosuvastatin  (CRESTOR ) 40 MG tablet Take 1 tablet (40 mg total) by mouth every other day.   sildenafil  (VIAGRA ) 100 MG tablet Take 1/2-1 tablet (50-100 mg total) by mouth daily as needed for erectile dysfunction.   [DISCONTINUED] Lansoprazole (PREVACID PO) Take by mouth daily.     [DISCONTINUED] omeprazole  (PRILOSEC) 40 MG capsule Take 1 capsule (40 mg total) by mouth daily.   No facility-administered encounter medications on file as of 03/05/2024.    History: Past Medical History:  Diagnosis Date   Allergic rhinitis    Colon polyps    adenomatous 2011 & 2014   Essential familial  hypercholesterolemia 10/01/2023   Family history of ischemic heart disease    GERD (gastroesophageal reflux disease)    HTN (hypertension) 04/16/2019   Hyperlipidemia    Other abnormal glucose    A1c 6% in 06/2009   Supraspinatus tendon tear    Past Surgical History:  Procedure Laterality Date   BACK SURGERY  02/12/2004   Dr [place], [place]   cardic catherization  02/12/2003   negative; Dr Lorn   colon polyps  2011 & 2014   Dr.Jacobs    COLONOSCOPY     CORNEAL TRANSPLANT Left 02/12/1991   Dr Rosan   nasal bone surgery  02/11/2005   UPPER GASTROINTESTINAL ENDOSCOPY  02/11/2009   mild gastritis; Dr Teressa   Family History  Problem Relation Age of Onset   Hypertension Mother    Diabetes Mother    Stroke Mother        in 54s   Diabetes Father    Hypertension Father    Heart attack Father        had open heart surgery   Diabetes Sister         X 2   Hypertension Sister    Diabetes Brother        X2   Heart attack Brother         MI in 74s   Transient ischemic attack Brother        in 30s   Kidney failure Brother    Hypertension Brother    Kidney failure  Brother        renal transplant   Diabetes Brother    COPD Neg Hx    Asthma Neg Hx    Cancer Neg Hx    Colon polyps Neg Hx    Colon cancer Neg Hx    Esophageal cancer Neg Hx    Rectal cancer Neg Hx    Stomach cancer Neg Hx    Social History   Occupational History   Occupation: production designer, theatre/television/film of hotels    Occupation: OWNER    Employer: [employer]  Tobacco Use   Smoking status: Never   Smokeless tobacco: Never  Vaping Use   Vaping status: Never Used  Substance and Sexual Activity   Alcohol use: Yes    Alcohol/week: 7.0 standard drinks of alcohol    Types: 7 Glasses of wine per week    Comment: red wine with dinner   Drug use: No   Sexual activity: Not on file   Tobacco Counseling Counseling given: Not Answered  SDOH Screenings   Food Insecurity: No Food Insecurity (03/05/2024)  Housing: Low Risk  (03/05/2024)  Transportation Needs: No Transportation Needs (03/05/2024)  Utilities: Not At Risk (03/05/2024)  Alcohol Screen: Low Risk (03/05/2024)  Depression (PHQ2-9): Low Risk (03/05/2024)  Financial Resource Strain: Low Risk (03/05/2024)  Physical Activity: Insufficiently Active (03/05/2024)  Social Connections: Socially Integrated (03/05/2024)  Stress: No Stress Concern Present (03/05/2024)  Tobacco Use: Low Risk (03/10/2024)  Health Literacy: Adequate Health Literacy (03/05/2024)   See flowsheets for full screening details  Depression Screen PHQ 2 & 9 Depression Scale- Over the past 2 weeks, how often have you been bothered by any of the following problems? Little interest or pleasure in doing things: 0 Feeling down, depressed, or hopeless (PHQ Adolescent also includes...irritable): 0 PHQ-2 Total Score: 0 Trouble falling or staying asleep, or sleeping too much: 1 Feeling tired or having little energy: 0 Poor appetite or overeating (PHQ Adolescent also includes...weight loss): 0 Feeling bad about yourself - or that you are a failure or have let yourself or your family down: 0 Trouble concentrating on things, such as reading the newspaper or watching television (PHQ Adolescent also includes...like school work): 0 Moving or speaking so slowly that other people could have noticed. Or the opposite - being so fidgety or restless that you have been moving around a lot more than usual: 0 Thoughts that you would be better off dead, or of hurting yourself in some way: 0 PHQ-9 Total Score: 1 If you checked off any problems, how difficult have these problems made it for you to do your work, take care of things at home, or get along with other people?: Not difficult at all  Depression Treatment Depression Interventions/Treatment : EYV7-0 Score <4 Follow-up Not Indicated     Goals Addressed               This Visit's Progress     Patient Stated (pt-stated)        Working on Cholesterol and  sugar intake/2026             Objective:    Today's Vitals   03/05/24 0850  BP: (!) 140/82  Pulse: 75  SpO2: 100%  Weight: 131 lb 6.4 oz (59.6 kg)  Height: 5' 7 (1.702 m)   Body mass index is 20.58 kg/m.  Hearing/Vision screen Hearing Screening - Comments:: Ear itchiness/sees a provider for it Vision Screening - Comments:: Wears eyeglasses for reading/UTD/Dr. Patrcia Immunizations and Health Maintenance Health Maintenance  Topic Date Due   Zoster Vaccines- Shingrix (1 of 2) Never done   DTaP/Tdap/Td (2 - Td or Tdap) 10/18/2020   Medicare Annual Wellness (AWV)  03/05/2025   Colonoscopy  04/16/2025   Pneumococcal Vaccine: 50+ Years  Completed   Influenza Vaccine  Completed   Hepatitis C Screening  Completed   Meningococcal B Vaccine  Aged Out   COVID-19 Vaccine  Discontinued        Assessment/Plan:  This is a routine wellness examination for Isaih.  Patient Care Team: Anner Alm ORN, MD as PCP - General (Cardiology) Elmira Newman PARAS, MD as PCP - Cardiology (Cardiology)  I have personally reviewed and noted the following in the patients chart:   Medical and social history Use of alcohol, tobacco or illicit drugs  Current medications and supplements including opioid prescriptions. Functional ability and status Nutritional status Physical activity Advanced directives List of other physicians Hospitalizations, surgeries, and ER visits in previous 12 months Vitals Screenings to include cognitive, depression, and falls Referrals and appointments  No orders of the defined types were placed in this encounter.  In addition, I have reviewed and discussed with patient certain preventive protocols, quality metrics, and best practice recommendations. A written personalized care plan for preventive services as well as general preventive health recommendations were provided to patient.   Bradlee Heitman L Zeniyah Peaster, CMA   03/05/2024   Return in 1 year (on  03/05/2025).  After Visit Summary: (MyChart) Due to this being a telephonic visit, the after visit summary with patients personalized plan was offered to patient via MyChart   Nurse Notes: Patient is due for a Shingrix vaccine and a Tdap vaccine.  He stated that he is due for a 3 month lab check and he thought that he was seeing Dr. Norleen today.   "

## 2024-03-05 NOTE — Telephone Encounter (Signed)
 Patient here for AWV and thought that he was coming to see Dr. Norleen.  Patient is request for 3 month follow up labs to be placed so he can get that done before he comes in for next office visit.  I informed patient that he had  a choice to come back in to see one of the office's  NP 's.  Patient did not give answer.

## 2024-03-08 ENCOUNTER — Telehealth: Payer: Self-pay

## 2024-03-08 NOTE — Telephone Encounter (Signed)
 Copied from CRM #[id number]. Topic: General - Other >> Mar 08, 2024  2:42 PM Rea ORN wrote: Reason for CRM: Joen, pt wife, is requesting a call back from nurse/CMA regarding labs and who to transition to when PCP is gone. Joen said they came up to the clinic after fasting and could not get the labs drawn. Joen also stated with PCP leaving, they want to know who he recommends pt to transfer to.  Please call back to advise,  [phone] or [phone]

## 2024-03-10 ENCOUNTER — Telehealth: Payer: Self-pay

## 2024-03-10 NOTE — Telephone Encounter (Signed)
 Copied from CRM #[id number]. Topic: Appointments - Transfer of Care >> Mar 10, 2024  4:30 PM Drema MATSU wrote: Pt is requesting to transfer FROM: Dr. Norleen Pt is requesting to transfer TO: Dr. Jerrell Reason for requested transfer: Dr. Norleen is retiring It is the responsibility of the team the patient would like to transfer to (Dr. Jerrell) to reach out to the patient if for any reason this transfer is not acceptable.

## 2024-03-11 ENCOUNTER — Other Ambulatory Visit (HOSPITAL_BASED_OUTPATIENT_CLINIC_OR_DEPARTMENT_OTHER): Payer: Self-pay

## 2024-03-11 DIAGNOSIS — H61301 Acquired stenosis of right external ear canal, unspecified: Secondary | ICD-10-CM | POA: Insufficient documentation

## 2024-03-11 MED ORDER — HYDROCORTISONE-ACETIC ACID 1-2 % OT SOLN
OTIC | 1 refills | Status: AC
  Filled 2024-03-11: qty 10, 25d supply, fill #0

## 2024-03-11 MED ORDER — ADACEL 5-2-15.5 LF-MCG/0.5 IM SUSY
PREFILLED_SYRINGE | INTRAMUSCULAR | 0 refills | Status: DC
  Filled 2024-03-11: qty 0.5, 1d supply, fill #0

## 2024-03-12 ENCOUNTER — Encounter: Payer: Self-pay | Admitting: Student in an Organized Health Care Education/Training Program

## 2024-03-12 ENCOUNTER — Ambulatory Visit: Admitting: Student in an Organized Health Care Education/Training Program

## 2024-03-12 VITALS — BP 127/74 | HR 74 | Resp 14 | Ht 67.0 in | Wt 134.0 lb

## 2024-03-12 DIAGNOSIS — I10 Essential (primary) hypertension: Secondary | ICD-10-CM

## 2024-03-12 DIAGNOSIS — E782 Mixed hyperlipidemia: Secondary | ICD-10-CM | POA: Diagnosis not present

## 2024-03-12 DIAGNOSIS — R7303 Prediabetes: Secondary | ICD-10-CM

## 2024-03-12 LAB — BASIC METABOLIC PANEL WITH GFR
BUN: 20 mg/dL (ref 6–23)
CO2: 30 meq/L (ref 19–32)
Calcium: 9.5 mg/dL (ref 8.4–10.5)
Chloride: 103 meq/L (ref 96–112)
Creatinine, Ser: 0.95 mg/dL (ref 0.40–1.50)
GFR: 82.99 mL/min
Glucose, Bld: 97 mg/dL (ref 70–99)
Potassium: 4.1 meq/L (ref 3.5–5.1)
Sodium: 140 meq/L (ref 135–145)

## 2024-03-12 LAB — LIPID PANEL
Cholesterol: 140 mg/dL (ref 28–200)
HDL: 73.9 mg/dL
LDL Cholesterol: 23 mg/dL (ref 10–99)
NonHDL: 66.23
Total CHOL/HDL Ratio: 2
Triglycerides: 218 mg/dL — ABNORMAL HIGH (ref 10.0–149.0)
VLDL: 43.6 mg/dL — ABNORMAL HIGH (ref 0.0–40.0)

## 2024-03-12 LAB — HEMOGLOBIN A1C: Hgb A1c MFr Bld: 6 % (ref 4.6–6.5)

## 2024-03-12 LAB — MICROALBUMIN / CREATININE URINE RATIO
Creatinine,U: 82.4 mg/dL
Microalb Creat Ratio: UNDETERMINED mg/g (ref 0.0–30.0)
Microalb, Ur: <0.7 mg/dL

## 2024-03-12 NOTE — Patient Instructions (Signed)
" °  VISIT SUMMARY: During your visit, we discussed your prediabetes, hyperlipidemia, and other health concerns. Your cholesterol levels have improved with your current medication regimen, and we will continue to monitor them. We also addressed your blood pressure, foot pain, and general health maintenance.  YOUR PLAN: -HYPERLIPIDEMIA: Hyperlipidemia means you have high levels of cholesterol in your blood. Your cholesterol levels have improved with Repatha  and statins, although the statins may be causing muscle pain. We will continue your current medications and review your cholesterol levels after your next blood work. If your levels remain stable, we may consider reducing your medication.  -HYPERTENSION: Hypertension means you have high blood pressure. Your blood pressure is currently controlled with amlodipine , although you occasionally experience elevated readings. Continue taking amlodipine  and monitor your blood pressure regularly.  -PREDIABETES: Prediabetes means your blood sugar levels are higher than normal but not high enough to be classified as diabetes. You are managing this condition with Jardiance . Continue taking Jardiance  as prescribed.  -CHRONIC TYMPANIC MEMBRANE PERFORATION: This condition means you have a hole in your eardrum. You recently had an ear infection that was treated with ear drops. Continue using the ear drops as prescribed.  -FOOT PARESTHESIA: Foot paresthesia means you experience unusual sensations in your feet, such as burning, itching, or tingling. These symptoms may be related to age. We will monitor your symptoms and consider further evaluation if they worsen.  -GENERAL HEALTH MAINTENANCE: We discussed the shingles vaccine due to your age and risk factors. I recommend you receive the shingles vaccine at the pharmacy.  INSTRUCTIONS: Please continue your current medications and monitor your blood pressure regularly. Follow up with blood work to review your cholesterol  levels. If your foot symptoms worsen, please let us  know. Also, remember to get the shingles vaccine at the pharmacy.    Contains text generated by [employer].   "

## 2024-03-12 NOTE — Assessment & Plan Note (Signed)
 Patient has a long history of hyperlipidemia, strong family history of ischemic heart disease, but no personal history of ischemic events.  He is currently on a very aggressive regimen for primary prevention including rosuvastatin , ezetimibe , and Repatha .  He has a CT coronary calcium  score from 2023 that is intermediate at 54 but has some possibly high risk left main disease.  He has no angina, lives a very active lifestyle.  He tells me today he really wants to reduce his medication burden.  He is also started on Plavix  for primary prevention of an ischemic event.  I recommended that the rosuvastatin  is the most well studied medication in this regimen, and carries the bulk of the benefit in reducing his risk of an ischemic event.  The benefit of Repatha  is uncertain, the additional benefit of ezetimibe  is likely very small, and I think there is probably no benefit to Plavix  comparable to the bleeding risk.  I am going to give him the option to continue the rosuvastatin  but discontinued the other medications if you would like to reduce his pill burden.  Certainly if he were to develop an event in the future or anginal symptoms, these medications would become more helpful.
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
## 2024-03-12 NOTE — Assessment & Plan Note (Signed)
 Chronic and stable.  Blood pressure looks great today at 127/74.  Doing really well on losartan  100 mg daily along with amlodipine  5 mg daily.  Will check renal function and urine microalbumin today.

## 2024-03-12 NOTE — Assessment & Plan Note (Signed)
 Chronic issue of prediabetes.  Will check an A1c today.  2 years ago he was started on Jardiance  for prediabetes.  He did not want to use metformin .  He wants to reduce his pill burden.  There is no benefit to Jardiance  in reducing complications of prediabetes.  He also has no history of heart failure.  I am giving him the option of discontinuing the Jardiance .

## 2024-03-15 ENCOUNTER — Ambulatory Visit: Payer: Self-pay | Admitting: Student in an Organized Health Care Education/Training Program

## 2024-03-17 ENCOUNTER — Other Ambulatory Visit (HOSPITAL_BASED_OUTPATIENT_CLINIC_OR_DEPARTMENT_OTHER): Payer: Self-pay

## 2024-03-17 ENCOUNTER — Other Ambulatory Visit: Payer: Self-pay | Admitting: Student in an Organized Health Care Education/Training Program

## 2024-03-17 MED ORDER — AMLODIPINE BESYLATE 5 MG PO TABS
5.0000 mg | ORAL_TABLET | Freq: Every day | ORAL | 3 refills | Status: AC
  Filled 2024-03-17: qty 90, 90d supply, fill #0

## 2024-06-10 ENCOUNTER — Ambulatory Visit: Admitting: Student in an Organized Health Care Education/Training Program
